# Patient Record
Sex: Female | Born: 1965
Health system: Southern US, Community
[De-identification: ages and names within clinical notes are randomized; demographics above are authoritative.]

## PROBLEM LIST (undated history)

## (undated) DIAGNOSIS — R7301 Impaired fasting glucose: Secondary | ICD-10-CM

## (undated) DIAGNOSIS — J45909 Unspecified asthma, uncomplicated: Secondary | ICD-10-CM

## (undated) DIAGNOSIS — M47812 Spondylosis without myelopathy or radiculopathy, cervical region: Secondary | ICD-10-CM

## (undated) DIAGNOSIS — E119 Type 2 diabetes mellitus without complications: Secondary | ICD-10-CM

## (undated) DIAGNOSIS — D497 Neoplasm of unspecified behavior of endocrine glands and other parts of nervous system: Secondary | ICD-10-CM

## (undated) DIAGNOSIS — R011 Cardiac murmur, unspecified: Secondary | ICD-10-CM

## (undated) DIAGNOSIS — F329 Major depressive disorder, single episode, unspecified: Secondary | ICD-10-CM

## (undated) DIAGNOSIS — F411 Generalized anxiety disorder: Secondary | ICD-10-CM

## (undated) DIAGNOSIS — R7303 Prediabetes: Secondary | ICD-10-CM

## (undated) DIAGNOSIS — M1990 Unspecified osteoarthritis, unspecified site: Secondary | ICD-10-CM

## (undated) DIAGNOSIS — M722 Plantar fascial fibromatosis: Secondary | ICD-10-CM

## (undated) DIAGNOSIS — F410 Panic disorder [episodic paroxysmal anxiety] without agoraphobia: Secondary | ICD-10-CM

## (undated) DIAGNOSIS — J309 Allergic rhinitis, unspecified: Secondary | ICD-10-CM

## (undated) DIAGNOSIS — R338 Other retention of urine: Secondary | ICD-10-CM

## (undated) DIAGNOSIS — H6093 Unspecified otitis externa, bilateral: Secondary | ICD-10-CM

## (undated) DIAGNOSIS — B029 Zoster without complications: Secondary | ICD-10-CM

## (undated) DIAGNOSIS — D250 Submucous leiomyoma of uterus: Secondary | ICD-10-CM

## (undated) DIAGNOSIS — N920 Excessive and frequent menstruation with regular cycle: Secondary | ICD-10-CM

## (undated) DIAGNOSIS — I10 Essential (primary) hypertension: Secondary | ICD-10-CM

## (undated) HISTORY — DX: Generalized anxiety disorder: F41.1

## (undated) HISTORY — DX: Cardiac murmur, unspecified: R01.1

## (undated) HISTORY — DX: Impaired fasting glucose: R73.01

## (undated) HISTORY — DX: Plantar fascial fibromatosis: M72.2

## (undated) HISTORY — PX: ABDOMINAL HYSTERECTOMY: SHX81

## (undated) HISTORY — DX: Essential (primary) hypertension: I10

## (undated) HISTORY — DX: Zoster without complications: B02.9

## (undated) HISTORY — DX: Prediabetes: R73.03

## (undated) HISTORY — DX: Excessive and frequent menstruation with regular cycle: N92.0

## (undated) HISTORY — DX: Type 2 diabetes mellitus without complications: E11.9

## (undated) HISTORY — DX: Spondylosis without myelopathy or radiculopathy, cervical region: M47.812

## (undated) HISTORY — DX: Unspecified osteoarthritis, unspecified site: M19.90

## (undated) HISTORY — DX: Neoplasm of unspecified behavior of endocrine glands and other parts of nervous system: D49.7

## (undated) HISTORY — DX: Allergic rhinitis, unspecified: J30.9

## (undated) HISTORY — DX: Submucous leiomyoma of uterus: D25.0

## (undated) HISTORY — DX: Unspecified asthma, uncomplicated: J45.909

## (undated) HISTORY — DX: Other retention of urine: R33.8

## (undated) HISTORY — DX: Major depressive disorder, single episode, unspecified: F32.9

## (undated) HISTORY — DX: Panic disorder (episodic paroxysmal anxiety): F41.0

---
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
## 2003-02-28 ENCOUNTER — Other Ambulatory Visit: Admission: RE | Admit: 2003-02-28 | Discharge: 2003-02-28 | Payer: Self-pay | Admitting: Gynecology

## 2003-05-19 ENCOUNTER — Ambulatory Visit (HOSPITAL_COMMUNITY): Admission: RE | Admit: 2003-05-19 | Discharge: 2003-05-19 | Payer: Self-pay | Admitting: Gynecology

## 2003-05-19 ENCOUNTER — Encounter: Payer: Self-pay | Admitting: Gynecology

## 2003-09-02 ENCOUNTER — Encounter: Payer: Self-pay | Admitting: Obstetrics and Gynecology

## 2003-09-02 ENCOUNTER — Ambulatory Visit (HOSPITAL_COMMUNITY): Admission: RE | Admit: 2003-09-02 | Discharge: 2003-09-02 | Payer: Self-pay | Admitting: Obstetrics and Gynecology

## 2003-11-29 ENCOUNTER — Encounter: Admission: RE | Admit: 2003-11-29 | Discharge: 2003-11-29 | Payer: Self-pay | Admitting: Obstetrics and Gynecology

## 2004-02-10 ENCOUNTER — Inpatient Hospital Stay (HOSPITAL_COMMUNITY): Admission: AD | Admit: 2004-02-10 | Discharge: 2004-02-13 | Payer: Self-pay | Admitting: Obstetrics and Gynecology

## 2004-03-19 ENCOUNTER — Other Ambulatory Visit: Admission: RE | Admit: 2004-03-19 | Discharge: 2004-03-19 | Payer: Self-pay | Admitting: Obstetrics and Gynecology

## 2004-03-21 ENCOUNTER — Encounter: Admission: RE | Admit: 2004-03-21 | Discharge: 2004-03-21 | Payer: Self-pay | Admitting: Obstetrics and Gynecology

## 2004-04-12 ENCOUNTER — Encounter: Admission: RE | Admit: 2004-04-12 | Discharge: 2004-04-12 | Payer: Self-pay | Admitting: Obstetrics and Gynecology

## 2004-08-10 ENCOUNTER — Encounter: Admission: RE | Admit: 2004-08-10 | Discharge: 2004-08-10 | Payer: Self-pay | Admitting: Obstetrics and Gynecology

## 2005-03-03 ENCOUNTER — Other Ambulatory Visit: Admission: RE | Admit: 2005-03-03 | Discharge: 2005-03-03 | Payer: Self-pay | Admitting: Gynecology

## 2006-01-30 ENCOUNTER — Other Ambulatory Visit: Admission: RE | Admit: 2006-01-30 | Discharge: 2006-01-30 | Payer: Self-pay | Admitting: Gynecology

## 2007-07-31 ENCOUNTER — Ambulatory Visit (HOSPITAL_COMMUNITY): Admission: RE | Admit: 2007-07-31 | Discharge: 2007-07-31 | Payer: Self-pay | Admitting: Family Medicine

## 2008-03-10 ENCOUNTER — Other Ambulatory Visit: Admission: RE | Admit: 2008-03-10 | Discharge: 2008-03-10 | Payer: Self-pay | Admitting: Gynecology

## 2009-12-09 DIAGNOSIS — M47812 Spondylosis without myelopathy or radiculopathy, cervical region: Secondary | ICD-10-CM

## 2009-12-09 HISTORY — DX: Spondylosis without myelopathy or radiculopathy, cervical region: M47.812

## 2010-01-18 ENCOUNTER — Ambulatory Visit (HOSPITAL_COMMUNITY): Admission: RE | Admit: 2010-01-18 | Discharge: 2010-01-18 | Payer: Self-pay | Admitting: Neurosurgery

## 2010-02-22 ENCOUNTER — Encounter: Admission: RE | Admit: 2010-02-22 | Discharge: 2010-03-20 | Payer: Self-pay | Admitting: Neurosurgery

## 2010-03-31 ENCOUNTER — Ambulatory Visit: Payer: Self-pay | Admitting: Family Medicine

## 2010-03-31 DIAGNOSIS — H6500 Acute serous otitis media, unspecified ear: Secondary | ICD-10-CM | POA: Insufficient documentation

## 2010-03-31 DIAGNOSIS — J309 Allergic rhinitis, unspecified: Secondary | ICD-10-CM

## 2010-03-31 DIAGNOSIS — J019 Acute sinusitis, unspecified: Secondary | ICD-10-CM | POA: Insufficient documentation

## 2010-03-31 HISTORY — DX: Allergic rhinitis, unspecified: J30.9

## 2010-05-09 ENCOUNTER — Encounter
Admission: RE | Admit: 2010-05-09 | Discharge: 2010-08-07 | Payer: Self-pay | Admitting: Physical Medicine & Rehabilitation

## 2010-05-11 ENCOUNTER — Ambulatory Visit: Payer: Self-pay | Admitting: Physical Medicine & Rehabilitation

## 2010-06-28 ENCOUNTER — Ambulatory Visit: Payer: Self-pay | Admitting: Physical Medicine & Rehabilitation

## 2010-07-24 ENCOUNTER — Other Ambulatory Visit: Admission: RE | Admit: 2010-07-24 | Discharge: 2010-07-24 | Payer: Self-pay | Admitting: Obstetrics and Gynecology

## 2010-08-24 ENCOUNTER — Encounter
Admission: RE | Admit: 2010-08-24 | Discharge: 2010-11-22 | Payer: Self-pay | Source: Home / Self Care | Attending: Physical Medicine & Rehabilitation | Admitting: Physical Medicine & Rehabilitation

## 2010-08-30 ENCOUNTER — Ambulatory Visit: Payer: Self-pay | Admitting: Physical Medicine & Rehabilitation

## 2010-09-25 ENCOUNTER — Ambulatory Visit: Payer: Self-pay | Admitting: Physical Medicine & Rehabilitation

## 2010-10-11 ENCOUNTER — Ambulatory Visit: Payer: Self-pay | Admitting: Physical Medicine & Rehabilitation

## 2010-11-08 ENCOUNTER — Ambulatory Visit: Payer: Self-pay | Admitting: Physical Medicine & Rehabilitation

## 2010-12-09 HISTORY — PX: ENDOMETRIAL BIOPSY: SHX622

## 2011-01-07 ENCOUNTER — Other Ambulatory Visit: Payer: Self-pay | Admitting: Obstetrics and Gynecology

## 2011-01-08 NOTE — Assessment & Plan Note (Signed)
Summary: Cough-dry, runny nose-green, ear pain - L x 3 wks rm proced rm   Vital Signs:  Patient Profile:   45 Years Old Female CC:      Cold & URI symptoms Height:     65.5 inches Weight:      142 pounds O2 Sat:      99 % O2 treatment:    Room Air Temp:     97.9 degrees F oral Pulse rate:   82 / minute Pulse rhythm:   regular Resp:     16 per minute BP sitting:   119 / 75  (right arm) Cuff size:   regular  Vitals Entered By: Areta Haber CMA (March 31, 2010 2:43 PM)                  Prior Medication List:  No prior medications documented  Current Allergies: No known allergies History of Present Illness Chief Complaint: Cold & URI symptoms History of Present Illness: Patient has had a URI w/ ear pain. She has had sinus drainage. Her symptoms has been going on for 2 weeks. She thought she was getting better when he became sick and she stayed up w/ him she had ar elapse. She has also had some nausea from the post nasal drainage and fever. Yesterday it was a 100.   Current Problems: ACUTE SEROUS OTITIS MEDIA (ICD-381.01) ACUTE SINUSITIS, UNSPECIFIED (ICD-461.9) FAMILY HISTORY DIABETES 1ST DEGREE RELATIVE (ICD-V18.0) FAMILY HISTORY OF ASTHMA (ICD-V17.5) ALLERGIC RHINITIS (ICD-477.9)   Current Meds ALLEGRA 180 MG TABS (FEXOFENADINE HCL) 1 tab by mouth once daily FLONASE 50 MCG/ACT SUSP (FLUTICASONE PROPIONATE) as directed ETODOLAC 400 MG TABS (ETODOLAC) 2 tabs by mouth once daily FLEXERIL 10 MG TABS (CYCLOBENZAPRINE HCL) as directed AUGMENTIN 875-125 MG TABS (AMOXICILLIN-POT CLAVULANATE) 1 by mouth 2 times daily * FLONASE OR NASONEX NASAL SPRAY 2 pufs each nostril q day * CLARITIN-D 24HRS  /OR ALLEGRA-D 24HRS sig 1 by mouth q day  REVIEW OF SYSTEMS Constitutional Symptoms      Denies fever, chills, night sweats, weight loss, weight gain, and fatigue.  Eyes       Denies change in vision, eye pain, eye discharge, glasses, contact lenses, and eye  surgery. Ear/Nose/Throat/Mouth       Complains of ear pain, frequent runny nose, sinus problems, and hoarseness.      Denies hearing loss/aids, change in hearing, ear discharge, dizziness, frequent nose bleeds, sore throat, and tooth pain or bleeding.      Comments: L, green x 3 wks Respiratory       Complains of dry cough.      Denies productive cough, wheezing, shortness of breath, asthma, bronchitis, and emphysema/COPD.  Cardiovascular       Denies murmurs, chest pain, and tires easily with exhertion.    Gastrointestinal       Denies stomach pain, nausea/vomiting, diarrhea, constipation, blood in bowel movements, and indigestion. Genitourniary       Denies painful urination, kidney stones, and loss of urinary control. Neurological       Complains of headaches.      Denies paralysis, seizures, and fainting/blackouts. Musculoskeletal       Denies muscle pain, joint pain, joint stiffness, decreased range of motion, redness, swelling, muscle weakness, and gout.  Skin       Denies bruising, unusual mles/lumps or sores, and hair/skin or nail changes.  Psych       Denies mood changes, temper/anger issues, anxiety/stress, speech problems, depression, and sleep  problems. Other Comments: Pt has not seen PCP for this.   Past History:  Family History: Last updated: 03/31/2010 Family History of Asthma Family History Diabetes 1st degree relative Family History of Cardiovascular disorder  Social History: Last updated: 03/31/2010 Married Alcohol use-no Drug use-no Regular exercise-yes Never Smoked  Risk Factors: Exercise: yes (03/31/2010)  Risk Factors: Smoking Status: never (03/31/2010)  Past Medical History: Allergic rhinitis  Past Surgical History: Appendectomy Caesarean section  Family History: Reviewed history and no changes required. Family History of Asthma Family History Diabetes 1st degree relative Family History of Cardiovascular disorder  Social  History: Reviewed history and no changes required. Married Alcohol use-no Drug use-no Regular exercise-yes Never Smoked Smoking Status:  never Drug Use:  no Does Patient Exercise:  yes Physical Exam General appearance: well developed, well nourished, milddistress Head: normocephalic, atraumatic Ears: fluid behind theL ear Nasal: swollen red turbinates with congestion Oral/Pharynx: pharyngeal erythema without exudate, uvula midline without deviation Neck: supple,anterior lymphadenopathy present Skin: no obvious rashes or lesions MSE: oriented to time, place, and person tenderness over the maxillary sinuses Assessment New Problems: ACUTE SEROUS OTITIS MEDIA (ICD-381.01) ACUTE SINUSITIS, UNSPECIFIED (ICD-461.9) FAMILY HISTORY DIABETES 1ST DEGREE RELATIVE (ICD-V18.0) FAMILY HISTORY OF ASTHMA (ICD-V17.5) ALLERGIC RHINITIS (ICD-477.9)  sinusitis  serous otitis  Patient Education: Patient and/or caregiver instructed in the following: rest fluids and Tylenol.  Plan New Medications/Changes: CLARITIN-D 24HRS  /OR ALLEGRA-D 24HRS sig 1 by mouth q day  #30 x 0, 03/31/2010, Hassan Rowan MD FLONASE OR NASONEX NASAL SPRAY 2 pufs each nostril q day  #1 x 0, 03/31/2010, Hassan Rowan MD AUGMENTIN [phone] MG TABS (AMOXICILLIN-POT CLAVULANATE) 1 by mouth 2 times daily  #20 x 0, 03/31/2010, Hassan Rowan MD  New Orders: New Patient Level III [phone] Follow Up: Follow up in 2-3 days if no improvement, Follow up on an as needed basis, Follow up with Primary Physician  The patient and/or caregiver has been counseled thoroughly with regard to medications prescribed including dosage, schedule, interactions, rationale for use, and possible side effects and they verbalize understanding.  Diagnoses and expected course of recovery discussed and will return if not improved as expected or if the condition worsens. Patient and/or caregiver verbalized understanding.  Prescriptions: CLARITIN-D 24HRS  /OR  ALLEGRA-D 24HRS sig 1 by mouth q day  #30 x 0   Entered and Authorized by:   Hassan Rowan MD   Signed by:   Hassan Rowan MD on 03/31/2010   Method used:   Print then Give to Patient   RxID:   [id number] FLONASE OR NASONEX NASAL SPRAY 2 pufs each nostril q day  #1 x 0   Entered and Authorized by:   Hassan Rowan MD   Signed by:   Hassan Rowan MD on 03/31/2010   Method used:   Print then Give to Patient   RxID:   [id number] AUGMENTIN 875-125 MG TABS (AMOXICILLIN-POT CLAVULANATE) 1 by mouth 2 times daily  #20 x 0   Entered and Authorized by:   Hassan Rowan MD   Signed by:   Hassan Rowan MD on 03/31/2010   Method used:   Print then Give to Patient   RxID:   [id number]   Patient Instructions: 1)  Please schedule an appointment with your primary doctor in : 2)  Please schedule a follow-up appointment in 2 weeks. 3)  Take your antibiotic as prescribed until ALL of it is gone, but stop if you develop a rash or swelling and contact our office as  soon as possible. 4)  Acute sinusitis symptoms for less than 10 days are not helped by antibiotics.Use warm moist compresses, and over the counter decongestants ( only as directed). Call if no improvement in 5-7 days, sooner if increasing pain, fever, or new symptoms. 5)  Please schedule a follow-up appointment as needed.  Preventive Screening-Counseling & Management  Alcohol-Tobacco     Smoking Status: never  Caffeine-Diet-Exercise     Does Patient Exercise: yes      Drug Use:  no.

## 2011-02-12 ENCOUNTER — Ambulatory Visit: Payer: Self-pay | Admitting: Physical Medicine & Rehabilitation

## 2011-02-12 ENCOUNTER — Encounter: Payer: 59 | Attending: Physical Medicine & Rehabilitation

## 2011-02-12 DIAGNOSIS — M47812 Spondylosis without myelopathy or radiculopathy, cervical region: Secondary | ICD-10-CM | POA: Insufficient documentation

## 2011-02-12 DIAGNOSIS — M5030 Other cervical disc degeneration, unspecified cervical region: Secondary | ICD-10-CM | POA: Insufficient documentation

## 2011-02-12 DIAGNOSIS — M549 Dorsalgia, unspecified: Secondary | ICD-10-CM | POA: Insufficient documentation

## 2011-02-12 DIAGNOSIS — M542 Cervicalgia: Secondary | ICD-10-CM | POA: Insufficient documentation

## 2011-02-14 HISTORY — PX: ENDOMETRIAL ABLATION: SHX621

## 2011-02-23 ENCOUNTER — Encounter: Payer: Self-pay | Admitting: Family Medicine

## 2011-02-23 ENCOUNTER — Inpatient Hospital Stay (INDEPENDENT_AMBULATORY_CARE_PROVIDER_SITE_OTHER)
Admission: RE | Admit: 2011-02-23 | Discharge: 2011-02-23 | Disposition: A | Discharge status: Home / Self Care | Payer: 59 | Source: Ambulatory Visit | Attending: Family Medicine | Admitting: Family Medicine

## 2011-02-23 ENCOUNTER — Ambulatory Visit
Admission: RE | Admit: 2011-02-23 | Discharge: 2011-02-23 | Disposition: A | Discharge status: Home / Self Care | Payer: 59 | Source: Ambulatory Visit | Attending: Family Medicine | Admitting: Family Medicine

## 2011-02-23 ENCOUNTER — Other Ambulatory Visit: Payer: Self-pay | Admitting: Family Medicine

## 2011-02-23 DIAGNOSIS — S59909A Unspecified injury of unspecified elbow, initial encounter: Secondary | ICD-10-CM

## 2011-02-23 DIAGNOSIS — S59919A Unspecified injury of unspecified forearm, initial encounter: Secondary | ICD-10-CM

## 2011-02-23 DIAGNOSIS — S6990XA Unspecified injury of unspecified wrist, hand and finger(s), initial encounter: Secondary | ICD-10-CM

## 2011-02-23 DIAGNOSIS — S63509A Unspecified sprain of unspecified wrist, initial encounter: Secondary | ICD-10-CM

## 2011-02-23 DIAGNOSIS — M25539 Pain in unspecified wrist: Secondary | ICD-10-CM

## 2011-02-25 ENCOUNTER — Telehealth (INDEPENDENT_AMBULATORY_CARE_PROVIDER_SITE_OTHER): Payer: Self-pay | Admitting: *Deleted

## 2011-02-26 NOTE — Assessment & Plan Note (Signed)
Summary: wrist injury/TM rm 4   Vital Signs:  Patient Profile:   45 Years Old Female CC:      LT wrist pain Height:     65.5 inches Weight:      141.50 pounds O2 Sat:      100 % O2 treatment:    Room Air Temp:     98.6 degrees F oral Pulse rate:   64 / minute Resp:     16 per minute BP sitting:   119 / 68  (left arm) Cuff size:   regular  Vitals Entered By: Clemens Catholic LPN (February 23, 2011 12:37 PM)                  Updated Prior Medication List: No Medications Current Allergies (reviewed today): No known allergies History of Present Illness Chief Complaint: LT wrist pain History of Present Illness:  Subjective:  Patient complains of falling while skating last night, injuring her left wrist (hyper-extended)  REVIEW OF SYSTEMS Constitutional Symptoms      Denies fever, chills, night sweats, weight loss, weight gain, and fatigue.  Eyes       Denies change in vision, eye pain, eye discharge, glasses, contact lenses, and eye surgery. Ear/Nose/Throat/Mouth       Denies hearing loss/aids, change in hearing, ear pain, ear discharge, dizziness, frequent runny nose, frequent nose bleeds, sinus problems, sore throat, hoarseness, and tooth pain or bleeding.  Respiratory       Denies dry cough, productive cough, wheezing, shortness of breath, asthma, bronchitis, and emphysema/COPD.  Cardiovascular       Denies murmurs, chest pain, and tires easily with exhertion.    Gastrointestinal       Denies stomach pain, nausea/vomiting, diarrhea, constipation, blood in bowel movements, and indigestion. Genitourniary       Denies painful urination, kidney stones, and loss of urinary control. Neurological       Denies paralysis, seizures, and fainting/blackouts. Musculoskeletal       Denies muscle pain, joint pain, joint stiffness, decreased range of motion, redness, swelling, muscle weakness, and gout.  Skin       Denies bruising, unusual mles/lumps or sores, and hair/skin or nail  changes.  Psych       Denies mood changes, temper/anger issues, anxiety/stress, speech problems, depression, and sleep problems. Other Comments: pt states that she was roller skating last night and fell and injury her LT wrist. she states that she has a knot on it. she is able to move it. no OTC meds.   Past History:  Past Medical History: Reviewed history from 03/31/2010 and no changes required. Allergic rhinitis  Past Surgical History: Reviewed history from 03/31/2010 and no changes required. Appendectomy Caesarean section  Family History: Reviewed history from 03/31/2010 and no changes required. Family History of Asthma Family History Diabetes 1st degree relative Family History of Cardiovascular disorder  Social History: Reviewed history from 03/31/2010 and no changes required. Married Alcohol use-no Drug use-no Regular exercise-yes Never Smoked   Objective:  No acute distress  Left wrist:  No swelling or deformity.  Full range of motion.  Mild tenderness and ecchymosis volar area over distal radius.  No snuffbox tenderness.  Distal neurovascular intact  X-ray left wrist:  IMPRESSION: No acute bony injury. Assessment New Problems: WRIST SPRAIN, LEFT (ICD-842.00) WRIST INJURY, LEFT (ICD-959.3)   The patient and/or caregiver has been counseled thoroughly with regard to medications prescribed including dosage, schedule, interactions, rationale for use, and possible side effects and  they verbalize understanding.  Diagnoses and expected course of recovery discussed and will return if not improved as expected or if the condition worsens. Patient and/or caregiver verbalized understanding.   Orders Added: 1)  T-DG Wrist Complete*L* [73110] 2)  Splints- All Types [A4570] 3)  Est. Patient Level III [57846]  Appended Document: wrist injury/TM rm 4 Plan:  Splint applied; wear about 5 days.  Apply ice pack several times daily.  May take Aleve, 2 tabs every 12 hours.  Begin  range of motion exercises in about 5 days ([facility] information and instruction patient handout given).  Follow-up with sports med clinic if not improved two weeks.

## 2011-02-28 ENCOUNTER — Ambulatory Visit (HOSPITAL_BASED_OUTPATIENT_CLINIC_OR_DEPARTMENT_OTHER): Payer: 59 | Admitting: Physical Medicine & Rehabilitation

## 2011-02-28 DIAGNOSIS — M542 Cervicalgia: Secondary | ICD-10-CM

## 2011-02-28 DIAGNOSIS — M47812 Spondylosis without myelopathy or radiculopathy, cervical region: Secondary | ICD-10-CM

## 2011-03-07 NOTE — Progress Notes (Signed)
  Phone Note Outgoing Call Call back at [facility] Phone [phone]   Call placed by: Lajean Saver RN,  February 25, 2011 1:31 PM Summary of Call: Callback: Patient reports her wrist is a little better. She continues to wear her brace.

## 2011-03-13 ENCOUNTER — Ambulatory Visit: Payer: 59 | Attending: Physical Medicine & Rehabilitation | Admitting: Physical Therapy

## 2011-03-13 DIAGNOSIS — M542 Cervicalgia: Secondary | ICD-10-CM | POA: Insufficient documentation

## 2011-03-13 DIAGNOSIS — R293 Abnormal posture: Secondary | ICD-10-CM | POA: Insufficient documentation

## 2011-03-13 DIAGNOSIS — IMO0001 Reserved for inherently not codable concepts without codable children: Secondary | ICD-10-CM | POA: Insufficient documentation

## 2011-04-02 ENCOUNTER — Ambulatory Visit: Payer: 59 | Admitting: Physical Medicine & Rehabilitation

## 2011-04-25 ENCOUNTER — Ambulatory Visit (HOSPITAL_BASED_OUTPATIENT_CLINIC_OR_DEPARTMENT_OTHER): Payer: 59 | Admitting: Physical Medicine & Rehabilitation

## 2011-04-25 ENCOUNTER — Encounter: Payer: 59 | Attending: Physical Medicine & Rehabilitation

## 2011-04-25 DIAGNOSIS — M47812 Spondylosis without myelopathy or radiculopathy, cervical region: Secondary | ICD-10-CM | POA: Insufficient documentation

## 2011-04-25 DIAGNOSIS — M5030 Other cervical disc degeneration, unspecified cervical region: Secondary | ICD-10-CM

## 2011-04-25 DIAGNOSIS — M549 Dorsalgia, unspecified: Secondary | ICD-10-CM | POA: Insufficient documentation

## 2011-04-25 DIAGNOSIS — M542 Cervicalgia: Secondary | ICD-10-CM | POA: Insufficient documentation

## 2011-04-25 NOTE — Assessment & Plan Note (Signed)
REASON FOR VISIT:  Left greater than right-sided neck pain.  History of chronic neck pain, minor disk bulges at C4-5, C5-6 on MRI. No significant facet hypertrophy.  Trigger point injections have not been helpful.  Physical therapy has not been helped.  The patient has done some range of motion exercises which is partially helpful.  Her medial branch blocks left C4, 5, 6 gave her some relief, given October 11, 2010, repeated again.  She was referred to Dr. Nickola Major for radiofrequency, but did not insurance approval.  Then, she underwent what sounds like a cervical epidural and also had some improvement after that.  She continues to work 40 hours a week at [employer].  No pain radiating into the arms.  She has had no new medical problems, since I last saw her, but it started with some headache related to her neck pain.  Pain level is 8/10.  PHYSICAL EXAMINATION:  VITAL SIGNS:  Blood pressure 130/69, pulse 80, respirations 18, O2 sat 100% on room air. GENERAL:  No acute distress. MUSCULOSKELETAL:  Neck range of motion is full.  No directional component to pain.  Spurling's test is negative.  Upper extremity strength, range of motion, and deep tendon reflexes are normal.  Her left upper trap has tenderness to palpation.  IMPRESSION: 1. Cervical spondylosis, degenerative disk, and chronic neck pain.  No     radicular component. 2. Prior history of fibromyalgia, this may be playing into some of her     pain complaints.  PLAN: 1. We will start her on tramadol 50 t.i.d. 2. Toradol 30 mg IM today. 3. Refer her back to Dr. Nickola Major for repeat epidural.  Discussed with patient agrees with plan.     Erick Colace, M.D. Electronically Signed    AEK/MedQ D:  04/25/2011 12:46:18  T:  04/25/2011 23:50:42  Job #:  [id number]  cc:   Herminio Heads, MD Fax: [phone]

## 2011-04-26 ENCOUNTER — Ambulatory Visit: Payer: 59 | Admitting: Physical Medicine & Rehabilitation

## 2011-04-26 NOTE — Discharge Summary (Signed)
[name], Sara Burton                    ACCOUNT NO.:  [id number]   MEDICAL RECORD NO.:  [id number]                   PATIENT TYPE:  INP   LOCATION:  9105                                 FACILITY:  [facility]   PHYSICIAN:  Marie L. Williams, C.N.M.           DATE OF BIRTH:  May 11, 1966   DATE OF ADMISSION:  02/10/2004  DATE OF DISCHARGE:  02/13/2004                                 DISCHARGE SUMMARY   ADMISSION DIAGNOSES:  1. Intrauterine pregnancy at 41-3/7 weeks.  2. Active labor.  3. Group B strep positive.  4. Desires epidural.  5. Gestational diabetes.   DISCHARGE DIAGNOSES:  1. Intrauterine pregnancy at 41-3/7 weeks.  2. Active labor.  3. Group B strep positive.  4. Desires epidural.  5. Gestational diabetes.  6. Failed vacuum and arrest in transverse.   HOSPITAL PROCEDURES:  1. Epidural anesthesia.  2. Primary low transverse cesarean section of a viable female infant named     [name], Apgars 8 and 9, weight 8 pounds 14 ounces.   HOSPITAL COURSE:  The patient was admitted in active labor at 6 cm dilation.  She received an epidural for pain relief.  Amniotomy was performed at 8 cm  dilation.  She progressed to complete dilation and began to push.  The  baby's vertex failed to descend past +2 station, and a vacuum extraction was  attempted by Dr. Estanislado Pandy with traction over 3 contractions with pop offs.  Decision was made at that point to proceed with cesarean section due to  persistence of LOT and failure to descend.  Primary low transverse cesarean  section was performed under epidural anesthesia with no complications.  The  patient's recovery went well on postop day #1.  Vital signs were stable.  Hemoglobin was 10.6.  Fasting blood sugar was 70.  Two hour repeat blood  sugars were 102-126.  On postop day #2, fasting blood sugars were 73 and two  hour repeat blood sugars were 102 and 126.  On postop day #3, the patient  continued to be stable.  Her vital signs were within  normal limits.  Chest  was clear.  Heart rate regular rate and rhythm.  Incision was clean, dry and  intact.  Lochia was small.  Extremities within normal limits.  She was  deemed to receive the full benefit for hospital stay and was discharged  home.   DISCHARGE MEDICATIONS:  1. Tylox 1-2 p.o. q.4h. p.r.n.  2. Motrin 600 mg p.o. q.6h. p.r.n.   DISCHARGE LABORATORIES:  White blood cell count 16.8, hemoglobin 10.6,  hematocrit 30.9, platelets 161,000.   DISCHARGE INSTRUCTIONS:  1. Per __________ handout.  2. Discharge follow up in 6 weeks or p.r.n.  Marie L. Mayford Knife, C.N.M.    MLW/MEDQ  D:  02/13/2004  T:  02/13/2004  Job:  [id number]

## 2011-04-26 NOTE — H&P (Signed)
Sara Burton, [name]                    ACCOUNT NO.:  [id number]   MEDICAL RECORD NO.:  [id number]                   PATIENT TYPE:  INP   LOCATION:  9167                                 FACILITY:  [facility]   PHYSICIAN:  Osborn Coho, M.D.                DATE OF BIRTH:  01-31-1966   DATE OF ADMISSION:  02/10/2004  DATE OF DISCHARGE:                                HISTORY & PHYSICAL   Sara Burton is a 45 year old married white female, gravida 1, para 0,  at 41-3/7 weeks, who presents complaining of uterine contractions every four  to six minutes since about 2 a.m. this morning.  She denies any leaking or  vaginal bleeding but does report having seen a streak of red discharge.  She  denies any headaches, nausea, vomiting, or visual disturbances.  Her  pregnancy has been followed at [facility] by the M.D. service  and has been essentially uncomplicated by at risk for:   1. Gestational diabetes, in good control.  2. Advanced maternal age and declining amniocentesis.  3. History of fibroids.  4. History of infertility.  5. Positive group B strep.  6. She is also requesting an epidural for labor.    OBSTETRICAL AND GYNECOLOGIC HISTORY:  She is a gravida 1, para 0, with an  LMP of Apr 11, 2003.  History of fibroids diagnosed on ultrasound in June  2004.  History of HSG and Clomid challenge.   GENERAL MEDICAL HISTORY:  She has an allergy to [facility] FORMULA 44.   She reports having had the usual childhood diseases.  She has a history of a  mild heart murmur but does not take antibiotics prior to procedures and  occasional urinary tract infections.  She did have an accident where she  broke her collar bone in two places and broke a finger.   SURGICAL HISTORY:  Wisdom teeth and repair of her finger and an  appendectomy.   FAMILY HISTORY:  Significant for father with quadruple bypass surgery,  brother with asthma.  Paternal side of the family with insulin-dependent  diabetes and a sister with questionable thyroid disease, and paternal  grandmother has a history of only one kidney secondary to cancer, and  paternal grandmother also has breast cancer.   GENETIC HISTORY:  Essentially negative, though she is over age 21 and  declines an amniocentesis with this pregnancy.   SOCIAL HISTORY:  She is married to [employer], who is involved and  supportive.  He is employed full-time in [employer], and she is employed full  time as a Nurse, mental health.  She is of the [employer].  She denies any  illicit drug use, alcohol, or smoking with this pregnancy.   PRENATAL LABORATORY DATA:  Her blood type is A positive.  Her antibody  screen is negative.  Syphilis is negative.  Rubella is immune. Hepatitis B  surface antigen is negative.  Pap was within normal limits,  and her 36-week  beta strep was positive and her one-hour Glucola was elevated.  Her three-  hour GTT was abnormal, and she has been on diet control management of  gestational diabetes since then and has all been in good control without  needing insulin.  Her last ultrasound was estimated 8 pounds on February 22  and showed 62nd percentile, normal fluid.   PHYSICAL EXAMINATION:  VITAL SIGNS:  Stable, she is afebrile.  HEENT:  Grossly within normal limits.  CARDIAC:  Regular rhythm and rate.  CHEST:  Clear.  BREASTS:  Soft and nontender.  ABDOMEN:  Gravid with uterine contractions every four to six minutes.  Fetal  heart rate is reactive and reassuring.  PELVIC:  6 cm, 95% effaced, vertex, -1, and bulging membranes.  EXTREMITIES:  Within normal limits.   ASSESSMENT:  1. Intrauterine pregnancy at term.  2. Active labor.  3. Positive group B strep.  4. Gestational diabetes.  5. Desires epidural for labor.   Her plan per consult with Dr. Su Hilt is to admit to labor and delivery, be  placed on epidural for labor, and penicillin for group B strep prophylaxis.     Concha Pyo. Duplantis, C.N.M.               Osborn Coho, M.D.    SJD/MEDQ  D:  02/10/2004  T:  02/10/2004  Job:  [id number]

## 2011-04-26 NOTE — Op Note (Signed)
Sara Burton, Sara Burton                    ACCOUNT NO.:  [id number]   MEDICAL RECORD NO.:  [id number]                   PATIENT TYPE:  INP   LOCATION:  9105                                 FACILITY:  [facility]   PHYSICIAN:  Crist Fat. Rivard, M.D.              DATE OF BIRTH:  [birth year]   DATE OF PROCEDURE:  02/10/2004  DATE OF DISCHARGE:                                 OPERATIVE REPORT   PREOPERATIVE DIAGNOSES:  1. Intrauterine pregnancy at 41 weeks 3 days.  2. Failed vacuum delivery with arrest in transverse presentation,     transverse position.   POSTOPERATIVE DIAGNOSES:  1. Intrauterine pregnancy at 41 weeks 3 days.  2. Failed vacuum delivery with arrest in transverse presentation,     transverse position.   ANESTHESIA:  Epidural.   PROCEDURE:  Primary low transverse cesarean section.   SURGEON:  Crist Fat. Rivard, M.D.   ASSISTANT:  Elby Showers. Williams, C.N.M.   ESTIMATED BLOOD LOSS:  800 mL.   DESCRIPTION OF PROCEDURE:  After being informed of the planned procedure  with possible complications including bleeding, infection, injury to bowels,  bladder, and ureters, informed consent was obtained.  The patient is taken  to OR #1, given reinforcement of her previously-placed epidural.  She is  placed in a dorsal decubitus position, pelvis tilted to the left, prepped  and draped in a sterile fashion, and a Foley catheter is inserted in her  bladder.   After assessing adequate level of anesthesia, we infiltrated the suprapubic  area with 15 mL of Marcaine 0.25% and perform a Pfannenstiel incision, which  is brought down to the fascia.  The fascia is incised in the low transverse  fashion, linea alba is dissected, peritoneum is entered in a midline  fashion.  Visceral peritoneum is entered in the low transverse fashion,  allowing [place] to safely retract bladder by developing a bladder flap.  Uterus  is then entered in the low transverse fashion, first with knife, then  bluntly.   Amniotic fluid is clear.  We assist the birth of a female infant in  vertex presentation, LOT, LOT as transverse, at 2:56 p.m.  Mouth and nose  are suctioned, cord is clamped with two Kelly clamps and sectioned, and the  baby is given to the pediatrician present in the room.  Twenty milliliters  of blood is drawn from the umbilical vein, and the placenta is allowed to  deliver spontaneously.  It is complete, and cord has three vessels.  Uterine  revision is negative.  We proceed with closure of myometrium in two layers,  first with a running locked suture of 0 Vicryl, then with a Lembert suture  of 0 Vicryl imbricating the first line of suture.  Hemostasis is assessed  and adequate.  Both paracolic gutters are cleansed.  Both tubes and ovaries  are assessed and normal.  To be noted, a small subserosal fibroid on the  left cornu  measuring 2 cm as well as a submucosal fibroid on the anterior  wall measuring 3-4 cm.  Both paracolic gutters are cleansed and pelvis is  irrigated with warm saline.  Hemostasis is assessed and again felt to be  adequate.  Under fascia hemostasis is completed with cautery, and fascia is  closed with two running sutures of 0 Vicryl meeting midline.  The incision  is then irrigated with warm saline, hemostasis is completed with cautery,  and skin is closed with staples.   Instruments and sponges complete x2.  Estimated blood loss is 800 mL.  The  procedure is very well-tolerated by the patient, who is taken to the  recovery room in a well and stable condition.  A little boy named [name] was  born at 2:56 p.m., received an Apgar of 8 at one minute and 9 at five  minutes, and weighed 8 pounds 14 ounces.                                               Crist Fat Rivard, M.D.    SAR/MEDQ  D:  02/10/2004  T:  02/11/2004  Job:  [id number]

## 2011-06-21 ENCOUNTER — Ambulatory Visit: Payer: 59 | Admitting: Physical Medicine & Rehabilitation

## 2011-06-21 ENCOUNTER — Ambulatory Visit: Payer: 59 | Admitting: Neurosurgery

## 2011-09-09 ENCOUNTER — Encounter: Payer: 59 | Attending: Physical Medicine & Rehabilitation

## 2011-09-09 ENCOUNTER — Ambulatory Visit (HOSPITAL_BASED_OUTPATIENT_CLINIC_OR_DEPARTMENT_OTHER): Payer: 59 | Admitting: Physical Medicine & Rehabilitation

## 2011-09-09 DIAGNOSIS — M5030 Other cervical disc degeneration, unspecified cervical region: Secondary | ICD-10-CM

## 2011-09-09 DIAGNOSIS — M542 Cervicalgia: Secondary | ICD-10-CM

## 2011-09-09 DIAGNOSIS — M47812 Spondylosis without myelopathy or radiculopathy, cervical region: Secondary | ICD-10-CM | POA: Insufficient documentation

## 2011-09-09 DIAGNOSIS — R51 Headache: Secondary | ICD-10-CM | POA: Insufficient documentation

## 2011-09-09 NOTE — Assessment & Plan Note (Signed)
REASON FOR VISIT:  Neck pain.  Sara Burton is a 45 year old female with chronic neck pain.  She has undergone cervical medial branch blocks as well as epidural injections which seem to help initially, but really did not help long-term.  She has had MRI of the C-spine in 2008 and again in 2012, showing mild disk bulge C4-5, C5-6 which did not really progress over time compared to 2008.  She has had no upper extremity symptomatology.  She has gone through physical therapy, has not tried any traction however.  I did review her MRI of the cervical spine once again today and once again really not showing much in terms of significant pathology and I really do not think she would benefit from a surgical opinion.  Her medications include Tylenol which helps with the neck pain and also helps headaches.  She does not take this on a q.i.d. basis, but usually b.i.d.  She tried some tramadol, did not tolerate this from a GI standpoint.  In terms of her sleep, she does have some difficulty getting comfortable at night.  She has never tried a muscle relaxant.  PHYSICAL EXAMINATION:  VITAL SIGNS:  Her blood pressure is 115/69, pulse 62, respirations 14, O2 sat 100% on room air. GENERAL:  No acute distress.  Mood and affect appropriate. MUSCULOSKELETAL:  Her neck has full range of motion.  Upper extremity strength is normal.  Deep tendon reflexes are normal.  Upper extremity strength is normal in the upper extremities.  She has no tenderness to palpation to cervical spine.  IMPRESSION: 1. Cervical spondylosis with cervical degenerative disk, mild.  We     will trial her with some traction through PT and if successful,     home unit. 2. Continue TENS. 3. Continue heat 30 minutes q.2 hours p.r.n. 4. Trial Flexeril 10 mg at bedtime. 5. I will see her back in 1 month to reassess.  I do not think any     further imaging studies or interventional techniques are needed at     this time.     Erick Colace, M.D. Electronically Signed    AEK/MedQ D:  09/09/2011 14:47:51  T:  09/09/2011 23:01:15  Job #:  [id number]

## 2011-10-03 ENCOUNTER — Ambulatory Visit: Payer: 59 | Attending: Physical Medicine & Rehabilitation | Admitting: Rehabilitative and Restorative Service Providers"

## 2011-10-03 DIAGNOSIS — IMO0001 Reserved for inherently not codable concepts without codable children: Secondary | ICD-10-CM | POA: Insufficient documentation

## 2011-10-03 DIAGNOSIS — M542 Cervicalgia: Secondary | ICD-10-CM | POA: Insufficient documentation

## 2011-10-03 DIAGNOSIS — M2569 Stiffness of other specified joint, not elsewhere classified: Secondary | ICD-10-CM | POA: Insufficient documentation

## 2011-10-07 ENCOUNTER — Ambulatory Visit: Payer: 59 | Admitting: Rehabilitation

## 2011-10-07 ENCOUNTER — Ambulatory Visit: Payer: 59 | Admitting: Physical Medicine & Rehabilitation

## 2011-10-14 ENCOUNTER — Ambulatory Visit: Payer: 59 | Attending: Physical Medicine & Rehabilitation | Admitting: Rehabilitative and Restorative Service Providers"

## 2011-10-14 DIAGNOSIS — M2569 Stiffness of other specified joint, not elsewhere classified: Secondary | ICD-10-CM | POA: Insufficient documentation

## 2011-10-14 DIAGNOSIS — M542 Cervicalgia: Secondary | ICD-10-CM | POA: Insufficient documentation

## 2011-10-14 DIAGNOSIS — IMO0001 Reserved for inherently not codable concepts without codable children: Secondary | ICD-10-CM | POA: Insufficient documentation

## 2011-10-16 ENCOUNTER — Ambulatory Visit: Payer: 59 | Admitting: Rehabilitative and Restorative Service Providers"

## 2011-10-21 ENCOUNTER — Ambulatory Visit: Payer: 59

## 2011-10-25 ENCOUNTER — Ambulatory Visit: Payer: 59

## 2011-10-28 ENCOUNTER — Telehealth: Payer: Self-pay | Admitting: Family Medicine

## 2011-10-28 ENCOUNTER — Encounter: Payer: Self-pay | Admitting: Family Medicine

## 2011-10-28 ENCOUNTER — Ambulatory Visit (INDEPENDENT_AMBULATORY_CARE_PROVIDER_SITE_OTHER): Payer: 59 | Admitting: Family Medicine

## 2011-10-28 VITALS — BP 126/86 | HR 96 | Temp 98.0°F | Wt 151.0 lb

## 2011-10-28 DIAGNOSIS — J019 Acute sinusitis, unspecified: Secondary | ICD-10-CM | POA: Insufficient documentation

## 2011-10-28 MED ORDER — CICLESONIDE 50 MCG/ACT NA SUSP: 2.0000 | Freq: Every day | NASAL | Status: DC

## 2011-10-28 MED ORDER — HYDROCODONE-HOMATROPINE 5-1.5 MG/5ML PO SYRP: ORAL_SOLUTION | ORAL | Status: AC

## 2011-10-28 MED ORDER — AMOXICILLIN-POT CLAVULANATE 875-125 MG PO TABS: 1.0000 | ORAL_TABLET | Freq: Two times a day (BID) | ORAL | Status: AC

## 2011-10-28 NOTE — Patient Instructions (Signed)
Trial of mucinex DM or robitussin DM otc as directed on the box. May use OTC nasal saline spray or irrigation solution bid. OTC nonsedating antihistamines prn discussed.   

## 2011-10-28 NOTE — Telephone Encounter (Signed)
Pls request records from [facility] and from [facility] in [place].  Thx.

## 2011-10-28 NOTE — Telephone Encounter (Signed)
Done

## 2011-10-28 NOTE — Progress Notes (Signed)
Office Note 10/28/2011  CC:  Chief Complaint  Patient presents with  . Establish Care    congestion    HPI:  Sara Burton is a 45 y.o. White female who is here to establish care and discuss respiratory symptoms. Patient's most recent primary MD: [facility] (last visit was about 3 yrs ago). Old records in [facility] EMR were reviewed prior to or during today's visit.  Pt presents complaining of respiratory symptoms for 5  days.  Mostly nasal congestion/runny nose, sneezing, PND, scratchy throat.  Cough hacking/nagging/dry.  Worst symptoms seems to be the left upper teeth pain and intermittent jabbing left ear pain.  Lately the symptoms seem to be worsening. No fevers, no wheezing, and no SOB.  Symptoms worse in evening.  Symptoms improved by OTC cold meds--mildly. Smoker? no Recent sick contact? Yes, son. Muscle or joint aches? No. She has had her flu vaccine this season (> 2 wks ago).  ROS: no n/v/d or abdominal pain.  No rash.  No neck stiffness.   +Mild fatigue.  +Mild appetite loss.   Past Medical History  Diagnosis Date  . Cervical spondylosis 2011    w/out myelopathy; nerve block injections done 08/2010 and 10/2010 (Dr. Wynn Banker)  . Migraine     plus tension HA's; some occipital HA's associated with her cervicalgia  . Heart murmur     Pt reports this is present intermittently, says past ECHO normal.  She gets her GYN care through [facility]; last pap/pelvic was 03/2011. LMP was 10/20/11.  Past Surgical History  Procedure Date  . Cesarean section 02/2004  . Appendectomy 1979  . Finger surgery 1982    Ring finger right hand; no metal/screws in finger.    Family History  Problem Relation Age of Onset  . Heart disease Father     CABG in his 42s  . Diabetes Father   . Asthma Brother   . Cancer Paternal Grandmother     Breast; detected in her 47's at the earliest    History   Social History  . Marital Status: Married    Spouse Name: N/A    Number  of Children: N/A  . Years of Education: N/A   Occupational History  . Not on file.   Social History Main Topics  . Smoking status: Never Smoker   . Smokeless tobacco: Never Used  . Alcohol Use: No  . Drug Use: No  . Sexually Active: Not on file   Other Topics Concern  . 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   MEDS: Zyrtec 10mg  qd (generic/OTC). Outpatient Encounter Prescriptions as of 10/28/2011  Medication Sig Dispense Refill  . Aspirin-Acetaminophen-Caffeine (EXCEDRIN PO) Take by mouth as directed.        . Multiple Vitamin (MULTIVITAMIN) tablet Take 1 tablet by mouth daily.        . Pseudoeph-Doxylamine-DM-APAP (NYQUIL PO) Take by mouth as directed.        . Pseudoephedrine-APAP-DM (DAYQUIL PO) Take by mouth as directed.        Marland Kitchen amoxicillin-clavulanate (AUGMENTIN) 875-125 MG per tablet Take 1 tablet by mouth 2 (two) times daily.  20 tablet  0  . ciclesonide (OMNARIS) 50 MCG/ACT nasal spray Place 2 sprays into both nostrils daily.  12.5 g  0  . HYDROcodone-homatropine (HYCODAN) 5-1.5 MG/5ML syrup 1-2 tsp po q6h prn cough  120 mL  0    No Known Allergies  ROS Review of Systems  Constitutional: Positive for fatigue. Negative for fever.  HENT:       See hpi  Eyes: Negative for visual disturbance.  Respiratory: Negative for shortness of breath and wheezing.   Cardiovascular: Negative for chest pain and palpitations.  Gastrointestinal: Negative for nausea and abdominal pain.  Genitourinary: Negative for dysuria and urgency.  Musculoskeletal: Negative for back pain and joint swelling.  Skin: Negative for rash.  Neurological: Positive for headaches. Negative for dizziness, weakness and light-headedness.  Hematological: Negative for adenopathy.  Psychiatric/Behavioral: Negative for confusion and dysphoric mood.    PE; Blood pressure  126/86, pulse 96, temperature 98 F (36.7 C), temperature source Oral, weight 151 lb (68.493 kg), last menstrual period 10/20/2011, SpO2 98.00%. VS: noted--normal. Gen: alert, NAD, NONTOXIC APPEARING. HEENT: eyes without injection, drainage, or swelling.  Ears: EACs clear, TMs with normal light reflex and landmarks.  Nose: Clear rhinorrhea, with some dried, crusty exudate adherent to mildly injected mucosa.  No purulent d/c.  Diffuse paranasal sinus area TTP, more pronounced on left side. No facial swelling.  Throat and mouth without focal lesion.  No pharyngial swelling, erythema, or exudate.   Neck: supple, no LAD.   LUNGS: CTA bilat, nonlabored resps.   CV: RRR, no m/r/g. EXT: no c/c/e SKIN: no rash  Pertinent labs:  none  ASSESSMENT AND PLAN:   NEW PT: obtain old records.  Sinusitis acute Trial of augmentin 875mg  bid x 10d. Omnaris 2 sprays each nostril qd--sample given today. Trial of mucinex DM or robitussin DM otc as directed on the box. May use OTC nasal saline spray or irrigation solution bid. OTC nonsedating antihistamines prn discussed.  Decongestant use discussed--ok if tolerated in the past w/out side effect and if pt has no hx of HTN.      Return if symptoms worsen or fail to improve.

## 2011-10-28 NOTE — Assessment & Plan Note (Signed)
Trial of augmentin 875mg  bid x 10d. Omnaris 2 sprays each nostril qd--sample given today. Trial of mucinex DM or robitussin DM otc as directed on the box. May use OTC nasal saline spray or irrigation solution bid. OTC nonsedating antihistamines prn discussed.  Decongestant use discussed--ok if tolerated in the past w/out side effect and if pt has no hx of HTN.

## 2011-10-29 ENCOUNTER — Encounter: Payer: 59 | Attending: Physical Medicine & Rehabilitation

## 2011-10-29 ENCOUNTER — Ambulatory Visit (HOSPITAL_BASED_OUTPATIENT_CLINIC_OR_DEPARTMENT_OTHER): Payer: 59 | Admitting: Physical Medicine & Rehabilitation

## 2011-10-29 ENCOUNTER — Encounter: Payer: Self-pay | Admitting: Family Medicine

## 2011-10-29 DIAGNOSIS — M47812 Spondylosis without myelopathy or radiculopathy, cervical region: Secondary | ICD-10-CM | POA: Insufficient documentation

## 2011-10-29 DIAGNOSIS — R51 Headache: Secondary | ICD-10-CM | POA: Insufficient documentation

## 2011-10-29 DIAGNOSIS — IMO0001 Reserved for inherently not codable concepts without codable children: Secondary | ICD-10-CM

## 2011-10-29 DIAGNOSIS — M5030 Other cervical disc degeneration, unspecified cervical region: Secondary | ICD-10-CM | POA: Insufficient documentation

## 2011-10-29 NOTE — Assessment & Plan Note (Signed)
A 45 year old female with chronic neck pain.  She has undergone cervical medial branch blocks which gave her temporary relief x2, was then referred for cervical radiofrequency neurotomy, however insurance did not approve this.  She underwent epidural injection which gave her some relief initially, a repeat injection was not as effective.  She has had MRI of the C-spine in both 2008, again in 2012 showing mild disk bulge C4-5, C5-6 which really did not progress over time compared to 2008.  She has had no upper extremity symptomatology.  She has gone through physical therapy, has not tried any traction.  MEDICATIONS:  She has switched from Tylenol to Excedrin.  I did caution her not to take Aleve or ibuprofen along with Excedrin, this is an anti- inflammatory.  She has tried tramadol in the past, did not tolerate from GI standpoint.  In terms of sleep, has some difficulty getting comfortable at night. She is employed 40 hours a week.  REVIEW OF SYSTEMS:  Otherwise negative.  OBJECTIVE:  VITAL SIGNS:  Blood pressure 125/70, pulse 71, respirations 16, O2 sat 97% on room air, height 5 feet 5 inches, weight 140 pounds. GENERAL:  No acute distress.  Mood and affect appropriate.  Her gait is normal. MUSCULOSKELETAL:  Her neck has no tenderness to palpation.  Cervical spine range of motion limited in terms of right-sided rotation.  Her upper extremity strength is normal in deltoid biceps, triceps, grip. Upper extremity sensation and deep tendon reflexes are normal.  IMPRESSION: 1. Cervicalgia.  She has some cervical spondylosis, facet arthropathy.     I do think she would benefit from radiofrequency neurotomy,     however, there is some type of insurance with this. 2. Certainly,  no radicular signs.  Overall, she is doing quite well     on minimal medications.  I would like to see her back in a couple     of months, reassess, and go from there.  I discussed with patient,     agrees with  plan.     Erick Colace, M.D. Electronically Signed    AEK/MedQ D:  10/29/2011 12:34:03  T:  10/29/2011 20:15:10  Job #:  [id number]  cc:   Herminio Heads, MD Fax: [phone]

## 2011-11-20 ENCOUNTER — Encounter: Payer: Self-pay | Admitting: Family Medicine

## 2011-12-12 ENCOUNTER — Other Ambulatory Visit (HOSPITAL_COMMUNITY)
Admission: RE | Admit: 2011-12-12 | Discharge: 2011-12-12 | Disposition: A | Discharge status: Home / Self Care | Payer: 59 | Source: Ambulatory Visit | Attending: Obstetrics and Gynecology | Admitting: Obstetrics and Gynecology

## 2011-12-12 ENCOUNTER — Other Ambulatory Visit: Payer: Self-pay | Admitting: Obstetrics and Gynecology

## 2011-12-12 DIAGNOSIS — Z01419 Encounter for gynecological examination (general) (routine) without abnormal findings: Secondary | ICD-10-CM | POA: Insufficient documentation

## 2011-12-16 ENCOUNTER — Other Ambulatory Visit: Payer: Self-pay | Admitting: Obstetrics and Gynecology

## 2011-12-16 DIAGNOSIS — Z1231 Encounter for screening mammogram for malignant neoplasm of breast: Secondary | ICD-10-CM

## 2011-12-31 ENCOUNTER — Ambulatory Visit
Admission: RE | Admit: 2011-12-31 | Discharge: 2011-12-31 | Disposition: A | Discharge status: Home / Self Care | Payer: 59 | Source: Ambulatory Visit | Attending: Obstetrics and Gynecology | Admitting: Obstetrics and Gynecology

## 2011-12-31 DIAGNOSIS — Z1231 Encounter for screening mammogram for malignant neoplasm of breast: Secondary | ICD-10-CM

## 2012-01-01 ENCOUNTER — Other Ambulatory Visit: Payer: Self-pay | Admitting: Obstetrics and Gynecology

## 2012-01-03 ENCOUNTER — Other Ambulatory Visit: Payer: Self-pay | Admitting: Obstetrics and Gynecology

## 2012-01-03 DIAGNOSIS — R928 Other abnormal and inconclusive findings on diagnostic imaging of breast: Secondary | ICD-10-CM

## 2012-01-13 ENCOUNTER — Ambulatory Visit
Admission: RE | Admit: 2012-01-13 | Discharge: 2012-01-13 | Disposition: A | Discharge status: Home / Self Care | Payer: 59 | Source: Ambulatory Visit | Attending: Obstetrics and Gynecology | Admitting: Obstetrics and Gynecology

## 2012-01-13 DIAGNOSIS — R928 Other abnormal and inconclusive findings on diagnostic imaging of breast: Secondary | ICD-10-CM

## 2012-01-21 ENCOUNTER — Ambulatory Visit: Payer: 59 | Admitting: Physical Medicine & Rehabilitation

## 2012-01-29 ENCOUNTER — Encounter (HOSPITAL_COMMUNITY): Payer: Self-pay | Admitting: Pharmacist

## 2012-02-07 ENCOUNTER — Encounter (HOSPITAL_COMMUNITY)
Admission: RE | Admit: 2012-02-07 | Discharge: 2012-02-07 | Disposition: A | Discharge status: Home / Self Care | Payer: 59 | Source: Ambulatory Visit | Attending: Obstetrics and Gynecology | Admitting: Obstetrics and Gynecology

## 2012-02-07 ENCOUNTER — Encounter (HOSPITAL_COMMUNITY): Payer: Self-pay

## 2012-02-07 LAB — CBC
HCT: 39.5 % (ref 36.0–46.0)
Hemoglobin: 12.8 g/dL (ref 12.0–15.0)
MCH: 28.7 pg (ref 26.0–34.0)
MCHC: 32.4 g/dL (ref 30.0–36.0)
MCV: 88.6 fL (ref 78.0–100.0)
Platelets: 225 10*3/uL (ref 150–400)
RBC: 4.46 MIL/uL (ref 3.87–5.11)
RDW: 12.7 % (ref 11.5–15.5)
WBC: 6.3 10*3/uL (ref 4.0–10.5)

## 2012-02-07 LAB — SURGICAL PCR SCREEN
MRSA, PCR: NEGATIVE
Staphylococcus aureus: POSITIVE — AB

## 2012-02-07 NOTE — Patient Instructions (Addendum)
   Your procedure is scheduled on: Monday March 11th  Enter through the Main Entrance of [facility] at: 6am Pick up the phone at the desk and dial [phone] and inform us of your arrival.  Please call this number if you have any problems the morning of surgery: [phone]  Remember: Do not eat food after midnight: Sunday Do not drink clear liquids after:midnight Sunday Take these medicines the morning of surgery with a SIP OF WATER: none  Do not wear jewelry, make-up, or FINGER nail polish Do not wear lotions, powders, perfumes or deodorant. Do not shave 48 hours prior to surgery. Do not bring valuables to the hospital. Contacts, dentures or bridgework may not be worn into surgery.  Leave suitcase in the car. After Surgery it may be brought to your room. For patients being admitted to the hospital, checkout time is 11:00am the day of discharge.  Patients discharged on the day of surgery will not be allowed to drive home.     Remember to use your hibiclens as instructed.Please shower with 1/2 bottle the evening before your surgery and the other 1/2 bottle the morning of surgery. Neck down avoiding private area.   

## 2012-02-10 ENCOUNTER — Other Ambulatory Visit: Payer: Self-pay | Admitting: Obstetrics and Gynecology

## 2012-02-16 MED ORDER — DEXTROSE 5 % IV SOLN
1.0000 g | INTRAVENOUS | Status: AC
  Administered 2012-02-17: 1 g via INTRAVENOUS
  Filled 2012-02-16: qty 1

## 2012-02-17 ENCOUNTER — Inpatient Hospital Stay (HOSPITAL_COMMUNITY): Payer: 59 | Admitting: Anesthesiology

## 2012-02-17 ENCOUNTER — Ambulatory Visit (HOSPITAL_COMMUNITY)
Admission: RE | Admit: 2012-02-17 | Discharge: 2012-02-18 | Disposition: A | Discharge status: Home Health | Payer: 59 | Source: Ambulatory Visit | Attending: Obstetrics and Gynecology | Admitting: Obstetrics and Gynecology

## 2012-02-17 ENCOUNTER — Encounter (HOSPITAL_COMMUNITY): Payer: Self-pay | Admitting: *Deleted

## 2012-02-17 ENCOUNTER — Encounter (HOSPITAL_COMMUNITY)
Admission: RE | Disposition: A | Discharge status: Home Health | Payer: Self-pay | Source: Ambulatory Visit | Attending: Obstetrics and Gynecology

## 2012-02-17 ENCOUNTER — Encounter (HOSPITAL_COMMUNITY): Payer: Self-pay | Admitting: Anesthesiology

## 2012-02-17 DIAGNOSIS — N920 Excessive and frequent menstruation with regular cycle: Secondary | ICD-10-CM | POA: Insufficient documentation

## 2012-02-17 DIAGNOSIS — Z90710 Acquired absence of both cervix and uterus: Secondary | ICD-10-CM

## 2012-02-17 DIAGNOSIS — D251 Intramural leiomyoma of uterus: Secondary | ICD-10-CM | POA: Insufficient documentation

## 2012-02-17 DIAGNOSIS — Z01818 Encounter for other preprocedural examination: Secondary | ICD-10-CM | POA: Insufficient documentation

## 2012-02-17 DIAGNOSIS — Z01812 Encounter for preprocedural laboratory examination: Secondary | ICD-10-CM | POA: Insufficient documentation

## 2012-02-17 DIAGNOSIS — D252 Subserosal leiomyoma of uterus: Secondary | ICD-10-CM | POA: Insufficient documentation

## 2012-02-17 DIAGNOSIS — D250 Submucous leiomyoma of uterus: Secondary | ICD-10-CM | POA: Insufficient documentation

## 2012-02-17 HISTORY — PX: ROBOTIC ASSISTED LAPAROSCOPIC VAGINAL HYSTERECTOMY WITH FIBROID REMOVAL: SHX5387

## 2012-02-17 LAB — BASIC METABOLIC PANEL
BUN: 17 mg/dL (ref 6–23)
CO2: 25 mEq/L (ref 19–32)
Calcium: 8.4 mg/dL (ref 8.4–10.5)
Chloride: 102 mEq/L (ref 96–112)
Creatinine, Ser: 0.85 mg/dL (ref 0.50–1.10)
GFR calc Af Amer: >90 mL/min (ref >90)
GFR calc non Af Amer: 82 mL/min — ABNORMAL LOW (ref >90)
Glucose, Bld: 104 mg/dL — ABNORMAL HIGH (ref 70–99)
Potassium: 3.4 mEq/L — ABNORMAL LOW (ref 3.5–5.1)
Sodium: 138 mEq/L (ref 135–145)

## 2012-02-17 LAB — PREGNANCY, URINE: Preg Test, Ur: NEGATIVE

## 2012-02-17 SURGERY — ROBOTIC ASSISTED TOTAL HYSTERECTOMY
Anesthesia: General | Site: Abdomen | Wound class: Clean Contaminated

## 2012-02-17 MED ORDER — ROPIVACAINE HCL 5 MG/ML IJ SOLN
INTRAMUSCULAR | Status: DC | PRN
  Administered 2012-02-17: 30 mL via EPIDURAL

## 2012-02-17 MED ORDER — DEXAMETHASONE SODIUM PHOSPHATE 10 MG/ML IJ SOLN
INTRAMUSCULAR | Status: DC | PRN
  Administered 2012-02-17: 10 mg via INTRAVENOUS

## 2012-02-17 MED ORDER — ACETAMINOPHEN 325 MG PO TABS: 325.0000 mg | ORAL_TABLET | ORAL | Status: DC | PRN

## 2012-02-17 MED ORDER — ROPIVACAINE HCL 5 MG/ML IJ SOLN
INTRAMUSCULAR | Status: AC
  Filled 2012-02-17: qty 60

## 2012-02-17 MED ORDER — LACTATED RINGERS IV SOLN
INTRAVENOUS | Status: DC
  Administered 2012-02-17 (×2): via INTRAVENOUS

## 2012-02-17 MED ORDER — ARTIFICIAL TEARS OP OINT
TOPICAL_OINTMENT | OPHTHALMIC | Status: DC | PRN
  Administered 2012-02-17: 1 via OPHTHALMIC

## 2012-02-17 MED ORDER — ONDANSETRON HCL 4 MG/2ML IJ SOLN
INTRAMUSCULAR | Status: AC
  Filled 2012-02-17: qty 2

## 2012-02-17 MED ORDER — SCOPOLAMINE 1 MG/3DAYS TD PT72
1.0000 | MEDICATED_PATCH | Freq: Once | TRANSDERMAL | Status: DC
  Administered 2012-02-17: 1.5 mg via TRANSDERMAL

## 2012-02-17 MED ORDER — LIDOCAINE HCL (CARDIAC) 20 MG/ML IV SOLN
INTRAVENOUS | Status: AC
  Filled 2012-02-17: qty 5

## 2012-02-17 MED ORDER — KETOROLAC TROMETHAMINE 30 MG/ML IJ SOLN
INTRAMUSCULAR | Status: AC
  Administered 2012-02-17: 30 mg via INTRAVENOUS
  Filled 2012-02-17: qty 1

## 2012-02-17 MED ORDER — IBUPROFEN 600 MG PO TABS
600.0000 mg | ORAL_TABLET | Freq: Four times a day (QID) | ORAL | Status: DC | PRN
  Administered 2012-02-18: 600 mg via ORAL
  Filled 2012-02-17: qty 1

## 2012-02-17 MED ORDER — MEPERIDINE HCL 25 MG/ML IJ SOLN
25.0000 mg | Freq: Once | INTRAMUSCULAR | Status: AC
  Administered 2012-02-17: 25 mg via INTRAVENOUS

## 2012-02-17 MED ORDER — MIDAZOLAM HCL 2 MG/2ML IJ SOLN
INTRAMUSCULAR | Status: AC
  Filled 2012-02-17: qty 2

## 2012-02-17 MED ORDER — DEXAMETHASONE SODIUM PHOSPHATE 10 MG/ML IJ SOLN
INTRAMUSCULAR | Status: AC
  Filled 2012-02-17: qty 1

## 2012-02-17 MED ORDER — ROCURONIUM BROMIDE 50 MG/5ML IV SOLN
INTRAVENOUS | Status: AC
  Filled 2012-02-17: qty 2

## 2012-02-17 MED ORDER — GLYCOPYRROLATE 0.2 MG/ML IJ SOLN
INTRAMUSCULAR | Status: DC | PRN
  Administered 2012-02-17: .8 mg via INTRAVENOUS
  Administered 2012-02-17: 0.2 mg via INTRAVENOUS

## 2012-02-17 MED ORDER — FENTANYL CITRATE 0.05 MG/ML IJ SOLN
INTRAMUSCULAR | Status: DC | PRN
  Administered 2012-02-17: 100 ug via INTRAVENOUS
  Administered 2012-02-17 (×3): 50 ug via INTRAVENOUS

## 2012-02-17 MED ORDER — MEPERIDINE HCL 25 MG/ML IJ SOLN
INTRAMUSCULAR | Status: AC
  Administered 2012-02-17: 25 mg via INTRAVENOUS
  Filled 2012-02-17: qty 1

## 2012-02-17 MED ORDER — MIDAZOLAM HCL 5 MG/5ML IJ SOLN
INTRAMUSCULAR | Status: DC | PRN
  Administered 2012-02-17: 2 mg via INTRAVENOUS

## 2012-02-17 MED ORDER — LACTATED RINGERS IR SOLN
Status: DC | PRN
  Administered 2012-02-17: 3000 mL

## 2012-02-17 MED ORDER — OXYCODONE-ACETAMINOPHEN 5-325 MG PO TABS: ORAL_TABLET | ORAL | Status: DC

## 2012-02-17 MED ORDER — PROMETHAZINE HCL 12.5 MG PO TABS: 25.0000 mg | ORAL_TABLET | Freq: Four times a day (QID) | ORAL | Status: AC | PRN

## 2012-02-17 MED ORDER — LIDOCAINE HCL (CARDIAC) 20 MG/ML IV SOLN
INTRAVENOUS | Status: DC | PRN
  Administered 2012-02-17: 60 mg via INTRAVENOUS

## 2012-02-17 MED ORDER — FENTANYL CITRATE 0.05 MG/ML IJ SOLN
INTRAMUSCULAR | Status: AC
  Filled 2012-02-17: qty 5

## 2012-02-17 MED ORDER — OXYCODONE-ACETAMINOPHEN 5-325 MG PO TABS: 1.0000 | ORAL_TABLET | ORAL | Status: DC | PRN

## 2012-02-17 MED ORDER — ROCURONIUM BROMIDE 100 MG/10ML IV SOLN
INTRAVENOUS | Status: DC | PRN
  Administered 2012-02-17: 10 mg via INTRAVENOUS
  Administered 2012-02-17: 20 mg via INTRAVENOUS
  Administered 2012-02-17 (×4): 10 mg via INTRAVENOUS
  Administered 2012-02-17: 50 mg via INTRAVENOUS

## 2012-02-17 MED ORDER — ATROPINE SULFATE 0.4 MG/ML IJ SOLN
INTRAMUSCULAR | Status: AC
  Filled 2012-02-17: qty 1

## 2012-02-17 MED ORDER — KETOROLAC TROMETHAMINE 30 MG/ML IJ SOLN
30.0000 mg | Freq: Once | INTRAMUSCULAR | Status: AC
  Administered 2012-02-17: 30 mg via INTRAVENOUS
  Filled 2012-02-17 (×2): qty 1

## 2012-02-17 MED ORDER — ONDANSETRON HCL 4 MG/2ML IJ SOLN
INTRAMUSCULAR | Status: DC | PRN
  Administered 2012-02-17: 4 mg via INTRAVENOUS

## 2012-02-17 MED ORDER — OXYCODONE-ACETAMINOPHEN 5-325 MG PO TABS
1.0000 | ORAL_TABLET | ORAL | Status: DC | PRN
  Administered 2012-02-18: 1 via ORAL
  Filled 2012-02-17: qty 1

## 2012-02-17 MED ORDER — PROPOFOL 10 MG/ML IV EMUL
INTRAVENOUS | Status: DC | PRN
  Administered 2012-02-17: 150 mg via INTRAVENOUS

## 2012-02-17 MED ORDER — FENTANYL CITRATE 0.05 MG/ML IJ SOLN: 25.0000 ug | INTRAMUSCULAR | Status: DC | PRN

## 2012-02-17 MED ORDER — KETOROLAC TROMETHAMINE 30 MG/ML IJ SOLN
15.0000 mg | Freq: Once | INTRAMUSCULAR | Status: AC | PRN
  Administered 2012-02-17: 30 mg via INTRAVENOUS

## 2012-02-17 MED ORDER — NEOSTIGMINE METHYLSULFATE 1 MG/ML IJ SOLN
INTRAMUSCULAR | Status: DC | PRN
  Administered 2012-02-17: 4 mg via INTRAVENOUS

## 2012-02-17 MED ORDER — PROPOFOL 10 MG/ML IV EMUL
INTRAVENOUS | Status: AC
  Filled 2012-02-17: qty 20

## 2012-02-17 MED ORDER — GLYCOPYRROLATE 0.2 MG/ML IJ SOLN
INTRAMUSCULAR | Status: AC
  Filled 2012-02-17: qty 3

## 2012-02-17 MED ORDER — IBUPROFEN 200 MG PO TABS: 800.0000 mg | ORAL_TABLET | Freq: Three times a day (TID) | ORAL | Status: AC | PRN

## 2012-02-17 MED ORDER — SCOPOLAMINE 1 MG/3DAYS TD PT72
MEDICATED_PATCH | TRANSDERMAL | Status: AC
  Filled 2012-02-17: qty 1

## 2012-02-17 MED ORDER — NEOSTIGMINE METHYLSULFATE 1 MG/ML IJ SOLN
INTRAMUSCULAR | Status: AC
  Filled 2012-02-17: qty 10

## 2012-02-17 MED ORDER — ONDANSETRON HCL 4 MG/2ML IJ SOLN
4.0000 mg | Freq: Four times a day (QID) | INTRAMUSCULAR | Status: DC | PRN
  Administered 2012-02-17 – 2012-02-18 (×2): 4 mg via INTRAVENOUS
  Filled 2012-02-17: qty 2

## 2012-02-17 MED ORDER — ROCURONIUM BROMIDE 50 MG/5ML IV SOLN
INTRAVENOUS | Status: AC
  Filled 2012-02-17: qty 1

## 2012-02-17 SURGICAL SUPPLY — 75 items
APL SKNCLS STERI-STRIP NONHPOA (GAUZE/BANDAGES/DRESSINGS) ×1
BAG URINE DRAINAGE (UROLOGICAL SUPPLIES) ×2 IMPLANT
BARRIER ADHS 3X4 INTERCEED (GAUZE/BANDAGES/DRESSINGS) ×3 IMPLANT
BENZOIN TINCTURE PRP APPL 2/3 (GAUZE/BANDAGES/DRESSINGS) ×2 IMPLANT
BRR ADH 4X3 ABS CNTRL BYND (GAUZE/BANDAGES/DRESSINGS) ×2
CABLE HIGH FREQUENCY MONO STRZ (ELECTRODE) ×2 IMPLANT
CATH FOLEY 3WAY  5CC 16FR (CATHETERS) ×1
CATH FOLEY 3WAY 5CC 16FR (CATHETERS) ×1 IMPLANT
CONT PATH 16OZ SNAP LID 3702 (MISCELLANEOUS) ×2 IMPLANT
COVER MAYO STAND STRL (DRAPES) ×2 IMPLANT
COVER TABLE BACK 60X90 (DRAPES) ×4 IMPLANT
COVER TIP SHEARS 8 DVNC (MISCELLANEOUS) ×1 IMPLANT
COVER TIP SHEARS 8MM DA VINCI (MISCELLANEOUS) ×1
DECANTER SPIKE VIAL GLASS SM (MISCELLANEOUS) ×2 IMPLANT
DRAPE HUG U DISPOSABLE (DRAPE) ×2 IMPLANT
DRAPE LG THREE QUARTER DISP (DRAPES) ×4 IMPLANT
DRAPE MONITOR DA VINCI (DRAPE) IMPLANT
DRAPE WARM FLUID 44X44 (DRAPE) ×2 IMPLANT
ELECT REM PT RETURN 9FT ADLT (ELECTROSURGICAL) ×2
ELECTRODE REM PT RTRN 9FT ADLT (ELECTROSURGICAL) ×1 IMPLANT
EVACUATOR SMOKE 8.L (FILTER) ×2 IMPLANT
GAUZE VASELINE 3X9 (GAUZE/BANDAGES/DRESSINGS) IMPLANT
GLOVE BIO SURGEON STRL SZ 6.5 (GLOVE) ×6 IMPLANT
GLOVE BIOGEL M 6.5 STRL (GLOVE) ×4 IMPLANT
GLOVE BIOGEL PI IND STRL 6.5 (GLOVE) ×2 IMPLANT
GLOVE BIOGEL PI IND STRL 7.0 (GLOVE) ×4 IMPLANT
GLOVE BIOGEL PI INDICATOR 6.5 (GLOVE) ×2
GLOVE BIOGEL PI INDICATOR 7.0 (GLOVE) ×4
GLOVE ECLIPSE 6.5 STRL STRAW (GLOVE) ×8 IMPLANT
GOWN STRL REIN XL XLG (GOWN DISPOSABLE) ×12 IMPLANT
GYRUS RUMI II 2.5CM BLUE (DISPOSABLE)
GYRUS RUMI II 3.5CM BLUE (DISPOSABLE)
GYRUS RUMI II 4.0CM BLUE (DISPOSABLE)
KIT ACCESSORY DA VINCI DISP (KITS) ×1
KIT ACCESSORY DVNC DISP (KITS) ×1 IMPLANT
KIT DISP ACCESSORY 4 ARM (KITS) IMPLANT
NDL INSUFFLATION 14GA 120MM (NEEDLE) ×1 IMPLANT
NEEDLE INSUFFLATION 14GA 120MM (NEEDLE) ×2 IMPLANT
NS IRRIG 1000ML POUR BTL (IV SOLUTION) ×6 IMPLANT
OCCLUDER COLPOPNEUMO (BALLOONS) IMPLANT
PACK LAVH (CUSTOM PROCEDURE TRAY) ×2 IMPLANT
PAD PREP 24X48 CUFFED NSTRL (MISCELLANEOUS) ×4 IMPLANT
PLUG CATH AND CAP STER (CATHETERS) ×2 IMPLANT
PROTECTOR NERVE ULNAR (MISCELLANEOUS) ×4 IMPLANT
RUMI II 3.0CM BLUE KOH-EFFICIE (DISPOSABLE) IMPLANT
RUMI II GYRUS 2.5CM BLUE (DISPOSABLE) IMPLANT
RUMI II GYRUS 3.5CM BLUE (DISPOSABLE) IMPLANT
RUMI II GYRUS 4.0CM BLUE (DISPOSABLE) IMPLANT
SET CYSTO W/LG BORE CLAMP LF (SET/KITS/TRAYS/PACK) ×2 IMPLANT
SET IRRIG TUBING LAPAROSCOPIC (IRRIGATION / IRRIGATOR) ×2 IMPLANT
SOLUTION ELECTROLUBE (MISCELLANEOUS) ×2 IMPLANT
SPONGE LAP 18X18 X RAY DECT (DISPOSABLE) IMPLANT
STRIP CLOSURE SKIN 1/4X4 (GAUZE/BANDAGES/DRESSINGS) ×2 IMPLANT
SUT VIC AB 0 CT1 27 (SUTURE) ×14
SUT VIC AB 0 CT1 27XBRD ANBCTR (SUTURE) ×1 IMPLANT
SUT VIC AB 0 CT1 27XBRD ANTBC (SUTURE) ×5 IMPLANT
SUT VICRYL 0 27 CT2 27 ABS (SUTURE) ×5 IMPLANT
SUT VICRYL 0 UR6 27IN ABS (SUTURE) ×2 IMPLANT
SUT VICRYL RAPIDE 4/0 PS 2 (SUTURE) ×4 IMPLANT
SYR 30ML LL (SYRINGE) ×2 IMPLANT
SYR 50ML LL SCALE MARK (SYRINGE) ×2 IMPLANT
SYSTEM CONVERTIBLE TROCAR (TROCAR) IMPLANT
TIP UTERINE 5.1X6CM LAV DISP (MISCELLANEOUS) IMPLANT
TIP UTERINE 6.7X10CM GRN DISP (MISCELLANEOUS) IMPLANT
TIP UTERINE 6.7X6CM WHT DISP (MISCELLANEOUS) IMPLANT
TIP UTERINE 6.7X8CM BLUE DISP (MISCELLANEOUS) ×1 IMPLANT
TOWEL OR 17X24 6PK STRL BLUE (TOWEL DISPOSABLE) ×6 IMPLANT
TROCAR 12M 150ML BLUNT (TROCAR) IMPLANT
TROCAR 5M 150ML BLDLS (TROCAR) ×1 IMPLANT
TROCAR DISP BLADELESS 8 DVNC (TROCAR) ×1 IMPLANT
TROCAR DISP BLADELESS 8MM (TROCAR) ×1
TROCAR XCEL 12X100 BLDLESS (ENDOMECHANICALS) ×1 IMPLANT
TROCAR XCEL NON-BLD 5MMX100MML (ENDOMECHANICALS) ×2 IMPLANT
TUBING FILTER THERMOFLATOR (ELECTROSURGICAL) ×2 IMPLANT
WARMER LAPAROSCOPE (MISCELLANEOUS) ×2 IMPLANT

## 2012-02-17 NOTE — Anesthesia Preprocedure Evaluation (Addendum)
Anesthesia Evaluation  Patient identified by MRN, date of birth, ID band Patient awake    Reviewed: Allergy & Precautions, H&P , Patient's Chart, lab work & pertinent test results, reviewed documented beta blocker date and time   History of Anesthesia Complications (+) PONV  Airway Mallampati: II TM Distance: >3 FB Neck ROM: full    Dental No notable dental hx.    Pulmonary neg pulmonary ROS,  breath sounds clear to auscultation  Pulmonary exam normal       Cardiovascular Exercise Tolerance: Good negative cardio ROS  + Valvular Problems/Murmurs Rhythm:regular Rate:Normal     Neuro/Psych  Headaches, negative neurological ROS  negative psych ROS   GI/Hepatic negative GI ROS, Neg liver ROS,   Endo/Other  negative endocrine ROS  Renal/GU negative Renal ROS     Musculoskeletal   Abdominal   Peds  Hematology negative hematology ROS (+)   Anesthesia Other Findings PONV (postoperative nausea and vomiting)     Cervical spondylosis 2011 w/out myelopathy; nerve block injections done 08/2010 and 10/2010 (Dr. Wynn Banker)    Migraine   plus tension HA's; some occipital HA's associated with her cervicalgia Heart murmur   Pt reports this is present intermittently, says past ECHO normal.    Fibroids, submucosal   u/s 07/2010 Menorrhagia    Reproductive/Obstetrics negative OB ROS                           Anesthesia Physical Anesthesia Plan  ASA: II  Anesthesia Plan: General ETT   Post-op Pain Management:    Induction: Intravenous  Airway Management Planned: Oral ETT and Video Laryngoscope Planned  Additional Equipment:   Intra-op Plan:   Post-operative Plan:   Informed Consent: I have reviewed the patients History and Physical, chart, labs and discussed the procedure including the risks, benefits and alternatives for the proposed anesthesia with the patient or authorized representative who has  indicated his/her understanding and acceptance.   Dental Advisory Given  Plan Discussed with: CRNA and Surgeon  Anesthesia Plan Comments:        Discussed risk of worsening cervical disc issues at length. Pt understands and her questions were answered. Anesthesia Quick Evaluation

## 2012-02-17 NOTE — Anesthesia Postprocedure Evaluation (Signed)
Anesthesia Post Note  Patient: Sara Burton  Procedure(s) Performed: Procedure(s) (LRB): ROBOTIC ASSISTED TOTAL HYSTERECTOMY (N/A)  Anesthesia type: GA  Patient location: PACU  Post pain: Pain level controlled  Post assessment: Post-op Vital signs reviewed  Last Vitals:  Filed Vitals:   02/17/12 0610  BP: 121/73  Pulse: 75  Temp: 36.9 C  Resp: 16    Post vital signs: Reviewed  Level of consciousness: sedated  Complications: No apparent anesthesia complications

## 2012-02-17 NOTE — Transfer of Care (Signed)
  Patient: Sara Burton  Procedure(s) Performed: Procedure(s) (LRB):  ROBOTIC ASSISTED TOTAL HYSTERECTOMY (N/A)  Patient Location: PACU  Anesthesia Type: General  Level of Consciousness: awake, alert and sedated  Airway and Oxygen Therapy: Patient Spontanous Breathing and Patient connected to nasal cannula oxygen  Post-op Pain: none  Post-op Assessment: Post-op Vital signs reviewed and Patient's Cardiovascular Status Stable  Post-op Vital Signs: Reviewed and stable  Complications: No apparent anesthesia complications

## 2012-02-17 NOTE — Anesthesia Procedure Notes (Signed)
Procedure Name: Intubation Date/Time: 02/17/2012 7:38 AM Performed by: Graciela Husbands Pre-anesthesia Checklist: Suction available, Emergency Drugs available, Timeout performed, Patient identified and Patient being monitored Patient Re-evaluated:Patient Re-evaluated prior to inductionOxygen Delivery Method: Circle system utilized Preoxygenation: Pre-oxygenation with 100% oxygen Intubation Type: IV induction Ventilation: Mask ventilation without difficulty Laryngoscope Size: 3 (glide scope blade) Grade View: Grade I Tube type: Oral Tube size: 7.0 mm Number of attempts: 1 Airway Equipment and Method: Stylet and Video-laryngoscopy Placement Confirmation: ETT inserted through vocal cords under direct vision,  positive ETCO2 and breath sounds checked- equal and bilateral Secured at: 21 cm Tube secured with: Tape Dental Injury: Teeth and Oropharynx as per pre-operative assessment  Difficulty Due To: Difficult Airway- due to reduced neck mobility

## 2012-02-17 NOTE — H&P (Signed)
Date of Initial H&P: 02/11/2012 History reviewed, patient examined, no change in status, stable for surgery.

## 2012-02-17 NOTE — Anesthesia Postprocedure Evaluation (Signed)
  Anesthesia Post-op Note  Patient: Sara Burton  Procedure(s) Performed: Procedure(s) (LRB): ROBOTIC ASSISTED TOTAL HYSTERECTOMY (N/A)  [facility]  Anesthesia Type: General  Level of Consciousness: alert  and oriented  Airway and Oxygen Therapy: Patient Spontanous Breathing  Post-op Pain: mild  Post-op Assessment: Patient's Cardiovascular Status Stable and Respiratory Function Stable  Post-op Vital Signs: stable  Complications: No apparent anesthesia complications

## 2012-02-17 NOTE — Op Note (Signed)
02/17/2012  11:56 AM  PATIENT:  Sara Burton  46 y.o. female  PRE-OPERATIVE DIAGNOSIS:  Menorrhagia and Uterine fibroids  POST-OPERATIVE DIAGNOSIS:  Menorrhagia and Uterine fibroids   PROCEDURE:  Procedure(s) (LRB): ROBOTIC ASSISTED TOTAL HYSTERECTOMY (N/A)  SURGEON:  Surgeon(s) and Role:    * Tawnie Ehresman J. Richardson Dopp, MD - Primary    * Alison Murray, MD    * Geryl Rankins, MD - Assisting  PHYSICIAN ASSISTANT:NA   ASSISTANTS: Dr. Geryl Rankins   ANESTHESIA:   general  Findings. Adhesions of the omentum to the anterior abdominal wall. Multiple fibroid uterus  With 5 cm left pedunculated fibroid. Normal fallopian Tubes and Ovaries.   EBL:125   Total I/O In: 1000 [I.V.:1000] Out: 325 [Urine:200; Blood:125]  BLOOD ADMINISTERED:none  DRAINS: Urinary Catheter (Foley)   LOCAL MEDICATIONS USED:  OTHER Ropivacaine Protocol  SPECIMEN:  Source of Specimen:  Uterus and Cervix   DISPOSITION OF SPECIMEN:  PATHOLOGY  COUNTS:  YES  TOURNIQUET:  * No tourniquets in log *  DICTATION: .Other Dictation: Dictation Number   PLAN OF CARE: Discharge to home after PACU  PATIENT DISPOSITION:  PACU - hemodynamically stable.   Delay start of Pharmacological VTE agent (>24hrs) due to surgical blood loss or risk of bleeding: not applicable  Procedure: the patient was taken to the operating room where general anesthesia was obtained. The patient was then examined under anesthesia and found to have a 14 wk size uterus. She was placed in the dorsal lithotomy position and prepped and draped in a sterile fashion. A weighted speculum was placed into the vagina. A dever was placed anteriorly for retraction. The anterior lip of the cervix was grasped with a single tooth tenaculum. The vagina mucosa was injected with 2.5 ml of Ropivacaine at the 10, 2, 4 and 8 o'clock positions. The uterus was sounded to 8.5 cm. The cervix was dilated to 6 mm. A suture was placed at the 12 and 6 o'clock positions of  the cervix to facilitate placement of the RUMI uterine  manipulator. The Rumi manipulator was placed without difficulty. The weighted speculum and dever were removed. The vaginal occluder was insufflated.    Attention was turned to the patients abdomen where a 12 mm skin incision was made approximately 2 cm above the umbilicus. The veres needle was carefully introduced into the peritoneal cavity while tenting the abdominal wall. Intraperitoneal placement was confirmed by use of a water-filled syringe and drop in intraabdominal pressure with insufflation of CO2 gas. The veres needle was removed and a 12 mm trocar was advanced into the abdomen Intraabdominal placement was confirmed with the laparoscope. The laparoscope was removed and 60 cc of Ropivacaine was injected into the abdominal cavity. The laparoscope was then reinserted. At this point all trocar sites were determined , marked,  Injected with 10 cc of ropivacaine and placed under direct visualization. . A 12 mm skin incision was made in the upper left quadrant at the mid clavicular position.  A 12 mm accessory port was placed in the upper left quadrant at the mid clavicular position under direct visualization. An 8 mm trocar was placed in the later left upper quadrant. And later connected to robotic arm #2. Attention was turned to the right upper quadrant where an 8 mm mid clavicular trocar (later connected to  robotic Arm #1) was placed and a lateral 8 mm trocar was placed later connected to robotic arm #3). The patient was placed in steep trendelenburg.   Once  all ports were placed under direct visualization, the laparoscope was removed and  the DaVinci Robotic system was then right side docked.The arms were connected to the corresponding trocars as listed above. The laparoscope was then reinserted. The PK bipolar cautery was placed into port #1. The Monopolar scissors were placed into port #2 and pro-grasper was placed into port #3 . All instruments  were directed into the pelvis under direct visualization.    Attention was turned to the surgeons console. An adhesion of the omentum to the anterior abdominal wall was excised using monopolar scissors. The ureters were identified bilaterally. The right utero- ovarian ligament was cauterized with the PK and excised with scissors. The broad ligament was cauterized with the PK and excised with scissors. The round ligament was cauterized with the PK and excised with scissors. The anterior leaf of the broad ligament was incised along the bladder reflection to the midline.    The left  utero- ovarian ligament was cauterized with the PK and excised with scissors. The left broad ligament was cauterized with the PK and excised with scissors. The left  round ligament was cauterized with the PK and excised with scissors. The anterior leaf of the broad ligament was incised along the bladder reflection to the midline. The bladder was dissected off the lower uterine segment and the cervix via sharp and blunt dissection.   The uterine arteries were skeltonized bilaterally. They were cauterized with the PK and transected. The KOH ring was  Identified. The anterior colpotomy was performed followed by the posterior colpotomy. The uterus and specimen were withdrawn into the vagina and sent to pathology. The PK and scissors were removed. Long tip forceps were placed into port #1 and cutting needle driver was placed into port #2. The vaginal cuff angles were closed with figure of eight stitches of 0 vicryl. The remainder of the vaginal cuff was closed interrupted 0 vicryl figure- of -eight sutures. The pelvis was irrigated using the accessory port. Excellent hemostasis was noted.    All pedicles were examined and hemostasis was noted. The ureters were identified bilaterally and noted to periostalis. Interceed was placed along the vaginal cuff. All instruments were removed from there ports. All ports were removed under direct  visualization.The pneumopertonium was released.  The fascia of the 12 mm ports were closed with 0 vicryl. The skin incisions were closed with 4-0 vicryl  And derma bond.   Sponge lap and needle counts were correct x 2. The patient was awakened from anesthesia and taken to the recovery room in stable condition.

## 2012-02-17 NOTE — Anesthesia Postprocedure Evaluation (Signed)
  Anesthesia Post-op Note  Patient: Sara Burton  Procedure(s) Performed: Procedure(s) (LRB): ROBOTIC ASSISTED TOTAL HYSTERECTOMY (N/A)  Patient Location: PACU  Anesthesia Type: General  Level of Consciousness: awake, alert  and sedated  Airway and Oxygen Therapy: Patient Spontanous Breathing and Patient connected to nasal cannula oxygen  Post-op Pain: none  Post-op Assessment: Post-op Vital signs reviewed and Patient's Cardiovascular Status Stable  Post-op Vital Signs: Reviewed and stable  Complications: No apparent anesthesia complications

## 2012-02-17 NOTE — Addendum Note (Signed)
Addendum  created 02/17/12 1526 by Renford Dills, CRNA   Modules edited:Notes Section

## 2012-02-18 ENCOUNTER — Encounter: Payer: Self-pay | Admitting: Family Medicine

## 2012-02-18 MED ORDER — IBUPROFEN 600 MG PO TABS: 600.0000 mg | ORAL_TABLET | Freq: Four times a day (QID) | ORAL | Status: AC | PRN

## 2012-02-18 MED ORDER — OXYCODONE-ACETAMINOPHEN 5-325 MG PO TABS: 1.0000 | ORAL_TABLET | Freq: Four times a day (QID) | ORAL | Status: AC | PRN

## 2012-02-18 NOTE — Progress Notes (Signed)
Subjective: Patient reports tolerating PO and no problems voiding.    Objective: I have reviewed patient's vital signs, intake and output, medications and labs.  General: alert and cooperative GI: soft, non-tender; bowel sounds normal; no masses,  no organomegaly Extremities: extremities normal, atraumatic, no cyanosis or edema   Assessment/Plan: POD #1 s/p Robotic assisted total hysterectomy doing well.. D/c home follow up in office in 2 wks   LOS: 1 day    Alasdair Kleve J. 02/18/2012, 9:22 AM

## 2012-02-18 NOTE — Progress Notes (Signed)
Pt ambulated out  Teaching complete  Questions answered  Family with pt

## 2012-10-03 ENCOUNTER — Emergency Department
Admission: EM | Admit: 2012-10-03 | Discharge: 2012-10-03 | Disposition: A | Discharge status: Home / Self Care | Payer: 59 | Source: Home / Self Care | Attending: Family Medicine | Admitting: Family Medicine

## 2012-10-03 ENCOUNTER — Encounter: Payer: Self-pay | Admitting: Emergency Medicine

## 2012-10-03 DIAGNOSIS — M2669 Other specified disorders of temporomandibular joint: Secondary | ICD-10-CM

## 2012-10-03 DIAGNOSIS — M26629 Arthralgia of temporomandibular joint, unspecified side: Secondary | ICD-10-CM

## 2012-10-03 HISTORY — DX: Unspecified otitis externa, bilateral: H60.93

## 2012-10-03 MED ORDER — NAPROXEN 500 MG PO TABS: 500.0000 mg | ORAL_TABLET | Freq: Two times a day (BID) | ORAL | Status: DC

## 2012-10-03 NOTE — ED Notes (Signed)
Patient reports pain in right ear x 3 days; has hx of ear infections and knows to seek help at this point. Has some sinus congestion.

## 2012-10-03 NOTE — ED Provider Notes (Signed)
History     CSN: [id number]  Arrival date & time 10/03/12  1630   First MD Initiated Contact with Patient 10/03/12 1749      Chief Complaint  Patient presents with  . Otalgia     HPI Comments: Patient reports pain in right ear x 3 days; has hx of ear infections and knows to seek help at this point. Has some sinus congestion, and recalls having had a sore throat about 2 weeks ago (resolved).  No cough or other URI symptoms.  No fevers, chills, and sweats   The history is provided by the patient.    Past Medical History  Diagnosis Date  . Cervical spondylosis 2011    w/out myelopathy; nerve block injections done 08/2010 and 10/2010 (Dr. Wynn Banker)  . Migraine     plus tension HA's; some occipital HA's associated with her cervicalgia  . Heart murmur     Pt reports this is present intermittently, says past ECHO normal.  . Fibroids, submucosal     u/s 07/2010  . Menorrhagia     Endo ablation 02/14/11  . PONV (postoperative nausea and vomiting)   . Bilateral external ear infections     Past Surgical History  Procedure Date  . Cesarean section 02/2004  . Appendectomy 1979  . Finger surgery 1982    Ring finger right hand; no metal/screws in finger.  . Endometrial biopsy 12/2010    Benign, with exogenous hormone effect.  No hyperplasia.  . Endometrial ablation 02/14/11  . Robotic assisted laparoscopic vaginal hysterectomy with fibroid removal 02/17/12    Benign leiomyomata and benign cervix on path    Family History  Problem Relation Age of Onset  . Heart disease Father     CABG in his 35s  . Diabetes Father   . Asthma Brother   . Cancer Paternal Grandmother     Breast; detected in her 47's at the earliest    History  Substance Use Topics  . Smoking status: Never Smoker   . Smokeless tobacco: Never Used  . Alcohol Use: No    OB History    Grav Para Term Preterm Abortions TAB SAB Ect Mult Living                  Review of Systems + sore throat, 2 weeks ago  resolved No cough No pleuritic pain No wheezing + nasal congestion ? post-nasal drainage No sinus pain/pressure No itchy/red eyes + right earache, described as "sharp" No hemoptysis No SOB No fever/chills No nausea No vomiting No abdominal pain No diarrhea No urinary symptoms No skin rashes No fatigue No myalgias No headache Used OTC meds without relief  Allergies  Review of patient's allergies indicates no known allergies.  Home Medications   Current Outpatient Rx  Name Route Sig Dispense Refill  . CETIRIZINE HCL 10 MG PO TABS Oral Take 10 mg by mouth daily.    Marland Kitchen MENTHOL (TOPICAL ANALGESIC) 4 % EX GEL Apply externally Apply 1 application topically as needed. For shoulder or neck pain    . ONE-DAILY MULTI VITAMINS PO TABS Oral Take 1 tablet by mouth daily.      Marland Kitchen NAPROXEN 500 MG PO TABS Oral Take 1 tablet (500 mg total) by mouth 2 (two) times daily. (every 12 hours with food) 20 tablet 1  . OXYCODONE-ACETAMINOPHEN 5-325 MG PO TABS  1-2 po every  6 hrs as needed for severe pain 30 tablet 0    BP 117/76  Pulse  63  Temp 98.1 F (36.7 C) (Oral)  Resp 16  Ht 5\' 5"  (1.651 m)  Wt 140 lb (63.504 kg)  BMI 23.30 kg/m2  SpO2 99%  Physical Exam Nursing notes and Vital Signs reviewed. Appearance:  Patient appears healthy, stated age, and in no acute distress Eyes:  Pupils are equal, round, and reactive to light and accomodation.  Extraocular movement is intact.  Conjunctivae are not inflamed  Ears:  Canals normal.  Tympanic membranes normal.  There is distinct tenderness over the right temporomandibular joint.  Palpation there recreates her pain. Nose:   Normal turbinates.  No sinus tenderness.   Pharynx:  Normal Neck:  Supple.  No adenopathy. Lungs:  Clear to auscultation.  Breath sounds are equal.  Heart:  Regular rate and rhythm without murmurs, rubs, or gallops.  Skin:  No rash present.   ED Course  Procedures  none  Labs Reviewed -  Tympanogram normal right ear;  "noisy" left ear (but appears basically normal)    1. TMJ pain dysfunction syndrome       MDM  Begin Naproxen. Apply ice pack for 15 minutes, 2 or 3 times daily.  Avoid chewy foods. Given a Mickel Crow patient information and instruction sheet on topic TMJ Followup with ENT if not improving        Lattie Haw, MD 10/06/12 [phone]

## 2012-10-11 ENCOUNTER — Emergency Department
Admission: EM | Admit: 2012-10-11 | Discharge: 2012-10-11 | Disposition: A | Discharge status: Home / Self Care | Payer: 59 | Source: Home / Self Care | Attending: Family Medicine | Admitting: Family Medicine

## 2012-10-11 DIAGNOSIS — B029 Zoster without complications: Secondary | ICD-10-CM

## 2012-10-11 MED ORDER — CEPHALEXIN 500 MG PO CAPS: 500.0000 mg | ORAL_CAPSULE | Freq: Three times a day (TID) | ORAL | Status: DC

## 2012-10-11 MED ORDER — VALACYCLOVIR HCL 1 G PO TABS: 1000.0000 mg | ORAL_TABLET | Freq: Three times a day (TID) | ORAL | Status: DC

## 2012-10-11 NOTE — ED Provider Notes (Signed)
History     CSN: [id number]  Arrival date & time 10/11/12  1136   First MD Initiated Contact with Patient 10/11/12 1236      Chief Complaint  Patient presents with  . Otalgia     HPI Comments: States she was seen here two days ago with possible TMJ.  She subsequently developed a burning sensation around her right ear, followed by development of small blisters and swelling to the right ear  Patient is a 46 y.o. female presenting with ear pain. The history is provided by the patient.  Otalgia This is a new problem. Episode onset: 6 days ago. There is pain in the right ear. The problem occurs constantly. The problem has been gradually worsening. There has been no fever. The pain is mild. Associated symptoms include headaches and rash. Pertinent negatives include no ear discharge, no hearing loss, no rhinorrhea, no sore throat, no abdominal pain, no vomiting, no neck pain and no cough.    Past Medical History  Diagnosis Date  . Cervical spondylosis 2011    w/out myelopathy; nerve block injections done 08/2010 and 10/2010 (Dr. Wynn Banker)  . Migraine     plus tension HA's; some occipital HA's associated with her cervicalgia  . Heart murmur     Pt reports this is present intermittently, says past ECHO normal.  . Fibroids, submucosal     u/s 07/2010  . Menorrhagia     Endo ablation 02/14/11  . PONV (postoperative nausea and vomiting)   . Bilateral external ear infections     Past Surgical History  Procedure Date  . Cesarean section 02/2004  . Appendectomy 1979  . Finger surgery 1982    Ring finger right hand; no metal/screws in finger.  . Endometrial biopsy 12/2010    Benign, with exogenous hormone effect.  No hyperplasia.  . Endometrial ablation 02/14/11  . Robotic assisted laparoscopic vaginal hysterectomy with fibroid removal 02/17/12    Benign leiomyomata and benign cervix on path    Family History  Problem Relation Age of Onset  . Heart disease Father     CABG in his 50s  .  Diabetes Father   . Asthma Brother   . Cancer Paternal Grandmother     Breast; detected in her 32's at the earliest    History  Substance Use Topics  . Smoking status: Never Smoker   . Smokeless tobacco: Never Used  . Alcohol Use: No    OB History    Grav Para Term Preterm Abortions TAB SAB Ect Mult Living                  Review of Systems  HENT: Positive for ear pain. Negative for hearing loss, sore throat, rhinorrhea, neck pain and ear discharge.   Respiratory: Negative for cough.   Gastrointestinal: Negative for vomiting and abdominal pain.  Skin: Positive for rash.  Neurological: Positive for headaches.  All other systems reviewed and are negative.    Allergies  Review of patient's allergies indicates no known allergies.  Home Medications   Current Outpatient Rx  Name  Route  Sig  Dispense  Refill  . CEPHALEXIN 500 MG PO CAPS   Oral   Take 1 capsule (500 mg total) by mouth 3 (three) times daily.   21 capsule   0   . CETIRIZINE HCL 10 MG PO TABS   Oral   Take 10 mg by mouth daily.         Marland Kitchen MENTHOL (TOPICAL  ANALGESIC) 4 % EX GEL   Apply externally   Apply 1 application topically as needed. For shoulder or neck pain         . ONE-DAILY MULTI VITAMINS PO TABS   Oral   Take 1 tablet by mouth daily.           Marland Kitchen NAPROXEN 500 MG PO TABS   Oral   Take 1 tablet (500 mg total) by mouth 2 (two) times daily. (every 12 hours with food)   20 tablet   1   . OXYCODONE-ACETAMINOPHEN 5-325 MG PO TABS      1-2 po every  6 hrs as needed for severe pain   30 tablet   0   . VALACYCLOVIR HCL 1 G PO TABS   Oral   Take 1 tablet (1,000 mg total) by mouth 3 (three) times daily.   21 tablet   0     BP 122/73  Pulse 68  Temp 98.3 F (36.8 C) (Oral)  Resp 18  Ht 5\' 5"  (1.651 m)  Wt 148 lb (67.132 kg)  BMI 24.63 kg/m2  SpO2 100%  LMP 01/24/2012  Physical Exam  Nursing note and vitals reviewed. Constitutional: She is oriented to person, place, and  time.  HENT:  Head: Normocephalic and atraumatic.  Left Ear: External ear normal.  Ears:  Nose: Nose normal.  Mouth/Throat: Oropharynx is clear and moist. No oropharyngeal exudate.       Right external ear reveals several small herpetiform lesions as noted on diagram, with mild surrounding tenderness and swelling.  Canal and tympanic membrane appear normal.  Mild post-auricular node tenderness palpated.  There is tenderness over the right temporomandibular joint   Eyes: Conjunctivae normal and EOM are normal. Pupils are equal, round, and reactive to light.  Neck: Neck supple.  Cardiovascular: Normal heart sounds.   Pulmonary/Chest: Breath sounds normal.  Lymphadenopathy:    She has no cervical adenopathy.  Neurological: She is alert and oriented to person, place, and time.  Skin: Skin is warm and dry. Rash noted.    ED Course  Procedures  none      1. Herpes zoster       MDM  Begin Valtrex, and empiric Keflex. Followup with ENT if not improved in about 5 days.Lattie Haw, MD 10/13/12 [phone]

## 2012-10-11 NOTE — ED Notes (Signed)
States she was seen here Saturday with possible TMJ and starting Friday noted with blisters and swelling to the right ear

## 2012-12-22 ENCOUNTER — Other Ambulatory Visit: Payer: Self-pay | Admitting: Obstetrics and Gynecology

## 2012-12-22 DIAGNOSIS — Z1231 Encounter for screening mammogram for malignant neoplasm of breast: Secondary | ICD-10-CM

## 2013-01-15 ENCOUNTER — Ambulatory Visit: Payer: 59

## 2013-01-19 ENCOUNTER — Ambulatory Visit
Admission: RE | Admit: 2013-01-19 | Discharge: 2013-01-19 | Disposition: A | Discharge status: Home / Self Care | Payer: Self-pay | Source: Ambulatory Visit | Attending: Obstetrics and Gynecology | Admitting: Obstetrics and Gynecology

## 2013-01-19 DIAGNOSIS — Z1231 Encounter for screening mammogram for malignant neoplasm of breast: Secondary | ICD-10-CM

## 2013-01-25 ENCOUNTER — Other Ambulatory Visit: Payer: Self-pay | Admitting: Obstetrics and Gynecology

## 2013-01-25 DIAGNOSIS — R928 Other abnormal and inconclusive findings on diagnostic imaging of breast: Secondary | ICD-10-CM

## 2013-01-29 ENCOUNTER — Ambulatory Visit
Admission: RE | Admit: 2013-01-29 | Discharge: 2013-01-29 | Disposition: A | Discharge status: Home / Self Care | Payer: Self-pay | Source: Ambulatory Visit | Attending: Obstetrics and Gynecology | Admitting: Obstetrics and Gynecology

## 2013-01-29 DIAGNOSIS — R928 Other abnormal and inconclusive findings on diagnostic imaging of breast: Secondary | ICD-10-CM

## 2013-05-05 ENCOUNTER — Other Ambulatory Visit (INDEPENDENT_AMBULATORY_CARE_PROVIDER_SITE_OTHER): Payer: 59

## 2013-05-05 ENCOUNTER — Ambulatory Visit (INDEPENDENT_AMBULATORY_CARE_PROVIDER_SITE_OTHER): Payer: 59 | Admitting: *Deleted

## 2013-05-05 DIAGNOSIS — Z0000 Encounter for general adult medical examination without abnormal findings: Secondary | ICD-10-CM

## 2013-05-05 DIAGNOSIS — Z23 Encounter for immunization: Secondary | ICD-10-CM

## 2013-05-05 LAB — COMPREHENSIVE METABOLIC PANEL
ALT: 11 U/L (ref 0–35)
AST: 13 U/L (ref 0–37)
Albumin: 3.9 g/dL (ref 3.5–5.2)
Alkaline Phosphatase: 45 U/L (ref 39–117)
BUN: 17 mg/dL (ref 6–23)
CO2: 27 mEq/L (ref 19–32)
Calcium: 9 mg/dL (ref 8.4–10.5)
Chloride: 105 mEq/L (ref 96–112)
Creatinine, Ser: 1 mg/dL (ref 0.4–1.2)
GFR: 67.39 mL/min (ref >60.00)
Glucose, Bld: 87 mg/dL (ref 70–99)
Potassium: 4.5 mEq/L (ref 3.5–5.1)
Sodium: 136 mEq/L (ref 135–145)
Total Bilirubin: 1.1 mg/dL (ref 0.3–1.2)
Total Protein: 6.6 g/dL (ref 6.0–8.3)

## 2013-05-05 LAB — CBC WITH DIFFERENTIAL/PLATELET
Basophils Absolute: 0 10*3/uL (ref 0.0–0.1)
Basophils Relative: 0.6 % (ref 0.0–3.0)
Eosinophils Absolute: 0.1 10*3/uL (ref 0.0–0.7)
Eosinophils Relative: 2.8 % (ref 0.0–5.0)
HCT: 39.9 % (ref 36.0–46.0)
Hemoglobin: 13.6 g/dL (ref 12.0–15.0)
Lymphocytes Relative: 36.8 % (ref 12.0–46.0)
Lymphs Abs: 2 10*3/uL (ref 0.7–4.0)
MCHC: 34 g/dL (ref 30.0–36.0)
MCV: 91.5 fl (ref 78.0–100.0)
Monocytes Absolute: 0.3 10*3/uL (ref 0.1–1.0)
Monocytes Relative: 5.4 % (ref 3.0–12.0)
Neutro Abs: 2.9 10*3/uL (ref 1.4–7.7)
Neutrophils Relative %: 54.4 % (ref 43.0–77.0)
Platelets: 220 10*3/uL (ref 150.0–400.0)
RBC: 4.36 Mil/uL (ref 3.87–5.11)
RDW: 13.1 % (ref 11.5–14.6)
WBC: 5.3 10*3/uL (ref 4.5–10.5)

## 2013-05-05 LAB — LIPID PANEL
Cholesterol: 164 mg/dL (ref 0–200)
HDL: 43.4 mg/dL (ref >39.00)
LDL Cholesterol: 105 mg/dL — ABNORMAL HIGH (ref 0–99)
Total CHOL/HDL Ratio: 4
Triglycerides: 79 mg/dL (ref 0.0–149.0)
VLDL: 15.8 mg/dL (ref 0.0–40.0)

## 2013-05-05 LAB — TSH: TSH: 1.69 u[IU]/mL (ref 0.35–5.50)

## 2013-05-05 NOTE — Progress Notes (Signed)
  Subjective:    Patient ID: Sara Burton, female    DOB: 06-Sep-1966, 47 y.o.   MRN: [id number]  HPI    Review of Systems     Objective:   Physical Exam        Assessment & Plan:  Patient presented for PPD placement and tolerated; informed of CDC regulations of 48-72 hour window for reading and [7] day waiting period if missed/SLS

## 2013-05-05 NOTE — Progress Notes (Signed)
Labs only

## 2013-05-07 ENCOUNTER — Encounter: Payer: Self-pay | Admitting: Family Medicine

## 2013-05-07 ENCOUNTER — Ambulatory Visit (INDEPENDENT_AMBULATORY_CARE_PROVIDER_SITE_OTHER): Payer: 59 | Admitting: Family Medicine

## 2013-05-07 VITALS — BP 127/85 | HR 70 | Temp 98.0°F | Resp 14 | Ht 65.25 in | Wt 140.8 lb

## 2013-05-07 DIAGNOSIS — IMO0002 Reserved for concepts with insufficient information to code with codable children: Secondary | ICD-10-CM

## 2013-05-07 DIAGNOSIS — Z23 Encounter for immunization: Secondary | ICD-10-CM

## 2013-05-07 DIAGNOSIS — J45909 Unspecified asthma, uncomplicated: Secondary | ICD-10-CM

## 2013-05-07 DIAGNOSIS — Z0000 Encounter for general adult medical examination without abnormal findings: Secondary | ICD-10-CM

## 2013-05-07 LAB — TB SKIN TEST
Induration: 0 mm
TB Skin Test: NEGATIVE

## 2013-05-07 MED ORDER — ALBUTEROL SULFATE HFA 108 (90 BASE) MCG/ACT IN AERS: 2.0000 | INHALATION_SPRAY | Freq: Four times a day (QID) | RESPIRATORY_TRACT | Status: DC | PRN

## 2013-05-07 NOTE — Patient Instructions (Signed)

## 2013-05-07 NOTE — Progress Notes (Signed)
Office Note 05/10/2013  CC:  Chief Complaint  Patient presents with  . Annual Exam    Needs PPD read.    HPI:  Sara Burton is a 47 y.o. White female who is here for CPE.  She gets breast/pelvic/pap through her GYN and these are UTD. She is switching jobs: has health exam certificate for me to fill out.  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 Co schools soon.  She complains of about 6 mo of tendency towards episodes of feeling chest tightness and shortness of breath with wheezing. She thinks it has been happening subtly for years but never bad enough to mention to an MD.  Triggers seem to be exercise and nasal allergy sx's. Denies fevers or chest pain with these episodes.    We reviewed her recent fasting labs today: all normal.    Past Medical History  Diagnosis Date  . Cervical spondylosis 2011    w/out myelopathy; nerve block injections done 08/2010 and 10/2010 (Dr. Wynn Banker)  . Migraine     plus tension HA's; some occipital HA's associated with her cervicalgia  . Heart murmur     Pt reports this is present intermittently, says past ECHO normal.  . Fibroids, submucosal     u/s 07/2010  . Menorrhagia     Endo ablation 02/14/11  . PONV (postoperative nausea and vomiting)   . Bilateral external ear infections   . ALLERGIC RHINITIS 03/31/2010    Qualifier: Diagnosis of  By: Thurmond Butts MD, Dennard Nip      Past Surgical History  Procedure Laterality Date  . Cesarean section  02/2004  . Appendectomy  1979  . Finger surgery  1982    Ring finger right hand; no metal/screws in finger.  . Endometrial biopsy  12/2010    Benign, with exogenous hormone effect.  No hyperplasia.  . Endometrial ablation  02/14/11  . Robotic assisted laparoscopic vaginal hysterectomy with fibroid removal  02/17/12    Benign leiomyomata and benign cervix on path    Family History  Problem Relation Age of Onset  . Heart disease Father     CABG in his 45s  . Diabetes Father   . Asthma  Brother   . Cancer Paternal Grandmother     Breast; detected in her 65's at the earliest    History   Social History  . Marital Status: Married    Spouse Name: N/A    Number of Children: N/A  . Years of Education: N/A   Occupational History  . Not on file.   Social History Main Topics  . Smoking status: Never Smoker   . Smokeless tobacco: Never Used  . Alcohol Use: No  . Drug Use: No  . Sexually Active: Yes   Other Topics Concern  . Not on file   Social History Narrative   Married, one son.   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   No T/A/Ds.   Has one brother and 1 sister.    Outpatient Prescriptions Prior to Visit  Medication Sig Dispense Refill  . cetirizine (ZYRTEC) 10 MG tablet Take 10 mg by mouth daily.      . Menthol, Topical Analgesic, (BIOFREEZE) 4 % GEL Apply 1 application topically as needed. For shoulder or neck pain      . Multiple Vitamin (MULTIVITAMIN) tablet Take 1 tablet by mouth daily.        . naproxen (NAPROSYN) 500 MG tablet Take 1  tablet (500 mg total) by mouth 2 (two) times daily. (every 12 hours with food)  20 tablet  1  . cephALEXin (KEFLEX) 500 MG capsule Take 1 capsule (500 mg total) by mouth 3 (three) times daily.  21 capsule  0  . oxyCODONE-acetaminophen (ROXICET) 5-325 MG per tablet 1-2 po every  6 hrs as needed for severe pain  30 tablet  0  . valACYclovir (VALTREX) 1000 MG tablet Take 1 tablet (1,000 mg total) by mouth 3 (three) times daily.  21 tablet  0   No facility-administered medications prior to visit.  **Pt no longer taking oxycodone, cephalexin, or valtrex.  No Known Allergies  ROS Review of Systems  Constitutional: Negative for fever, chills, appetite change and fatigue.  HENT: Negative for ear pain, congestion, sore throat, neck stiffness and dental problem.   Eyes: Negative for discharge, redness and visual disturbance.  Respiratory: Negative for cough.        See HPI   Cardiovascular: Negative for chest pain, palpitations and leg swelling.  Gastrointestinal: Negative for nausea, vomiting, abdominal pain, diarrhea and blood in stool.  Genitourinary: Negative for dysuria, urgency, frequency, hematuria, flank pain and difficulty urinating.  Musculoskeletal: Negative for myalgias, back pain, joint swelling and arthralgias.  Skin: Negative for pallor and rash.  Neurological: Negative for dizziness, speech difficulty, weakness and headaches.  Hematological: Negative for adenopathy. Does not bruise/bleed easily.  Psychiatric/Behavioral: Negative for confusion and sleep disturbance. The patient is not nervous/anxious.     PE; Blood pressure 127/85, pulse 70, temperature 98 F (36.7 C), temperature source Oral, resp. rate 14, height 5' 5.25" (1.657 m), weight 140 lb 12 oz (63.844 kg), last menstrual period 01/24/2012, SpO2 98.00%. Pt examined with CMA Burnard Leigh as chaperone. Gen: Alert, well appearing.  Patient is oriented to person, place, time, and situation. AFFECT: pleasant, lucid thought and speech. ENT: Ears: EACs clear, normal epithelium.  TMs with good light reflex and landmarks bilaterally.  Eyes: no injection, icteris, swelling, or exudate.  EOMI, PERRLA. Nose: no drainage or turbinate edema/swelling.  No injection or focal lesion.  Mouth: lips without lesion/swelling.  Oral mucosa pink and moist.  Dentition intact and without obvious caries or gingival swelling.  Oropharynx without erythema, exudate, or swelling.  Neck: supple/nontender.  No LAD, mass, or TM.  Carotid pulses 2+ bilaterally, without bruits. CV: RRR, no m/r/g.   LUNGS: CTA bilat, nonlabored resps, good aeration in all lung fields.  Forced expiratory maneuver elicited no wheeze or post-exhalation coughing. ABD: soft, NT, ND, BS normal.  No hepatospenomegaly or mass.  No bruits. EXT: no clubbing, cyanosis, or edema.  Musculoskeletal: no joint swelling, erythema, warmth, or tenderness.   ROM of all joints intact. Skin - no sores or suspicious lesions or rashes or color changes Neuro: CN 2-12 intact bilaterally, strength 5/5 in proximal and distal upper extremities and lower extremities bilaterally.  No sensory deficits.  No tremor.  No disdiadochokinesis.  No ataxia.  Upper extremity and lower extremity DTRs symmetric.  No pronator drift.  Pertinent labs:  Lab Results  Component Value Date   TSH 1.69 05/05/2013   Lab Results  Component Value Date   WBC 5.3 05/05/2013   HGB 13.6 05/05/2013   HCT 39.9 05/05/2013   MCV 91.5 05/05/2013   PLT 220.0 05/05/2013   Lab Results  Component Value Date   CREATININE 1.0 05/05/2013   BUN 17 05/05/2013   NA 136 05/05/2013   K 4.5 05/05/2013   CL 105 05/05/2013  CO2 27 05/05/2013   Lab Results  Component Value Date   ALT 11 05/05/2013   AST 13 05/05/2013   ALKPHOS 45 05/05/2013   BILITOT 1.1 05/05/2013   Lab Results  Component Value Date   CHOL 164 05/05/2013   Lab Results  Component Value Date   HDL 43.40 05/05/2013   Lab Results  Component Value Date   LDLCALC 105* 05/05/2013   Lab Results  Component Value Date   TRIG 79.0 05/05/2013   Lab Results  Component Value Date   CHOLHDL 4 05/05/2013   No results found for this basename: PSA   ASSESSMENT AND PLAN:   Persistent asthma Start Proair HFA 2 puffs q4h prn. Therapeutic expectations and side effect profile of medication discussed today.  Patient's questions answered. See back in 2 mo to review sx's, response to Proair, etc.  Health maintenance examination Reviewed age and gender appropriate health maintenance issues (prudent diet, regular exercise, health risks of tobacco and excessive alcohol, use of seatbelts, fire alarms in home, use of sunscreen).  Also reviewed age and gender appropriate health screening as well as vaccine recommendations. Tdap IM today. Calcium and vit D dosing reviewed. Continue regular GYN f/u for breast cancer screening and cervical cancer  screening.  An After Visit Summary was printed and given to the patient.  FOLLOW UP:  Return in about 2 months (around 07/07/2013) for f/u asthma.

## 2013-05-10 ENCOUNTER — Encounter: Payer: Self-pay | Admitting: Family Medicine

## 2013-05-10 DIAGNOSIS — Z0000 Encounter for general adult medical examination without abnormal findings: Secondary | ICD-10-CM | POA: Insufficient documentation

## 2013-05-10 DIAGNOSIS — IMO0002 Reserved for concepts with insufficient information to code with codable children: Secondary | ICD-10-CM | POA: Insufficient documentation

## 2013-05-10 NOTE — Assessment & Plan Note (Signed)
Start Proair HFA 2 puffs q4h prn. Therapeutic expectations and side effect profile of medication discussed today.  Patient's questions answered. See back in 2 mo to review sx's, response to Proair, etc.

## 2013-05-10 NOTE — Assessment & Plan Note (Signed)
Reviewed age and gender appropriate health maintenance issues (prudent diet, regular exercise, health risks of tobacco and excessive alcohol, use of seatbelts, fire alarms in home, use of sunscreen).  Also reviewed age and gender appropriate health screening as well as vaccine recommendations. Tdap IM today. Calcium and vit D dosing reviewed. Continue regular GYN f/u for breast cancer screening and cervical cancer screening.

## 2013-08-18 ENCOUNTER — Ambulatory Visit (INDEPENDENT_AMBULATORY_CARE_PROVIDER_SITE_OTHER): Payer: BC Managed Care – PPO | Admitting: Family Medicine

## 2013-08-18 ENCOUNTER — Encounter: Payer: Self-pay | Admitting: Family Medicine

## 2013-08-18 VITALS — BP 122/85 | HR 72 | Temp 99.5°F | Resp 18 | Ht 65.0 in | Wt 142.0 lb

## 2013-08-18 DIAGNOSIS — J209 Acute bronchitis, unspecified: Secondary | ICD-10-CM | POA: Insufficient documentation

## 2013-08-18 DIAGNOSIS — J019 Acute sinusitis, unspecified: Secondary | ICD-10-CM

## 2013-08-18 MED ORDER — ALBUTEROL SULFATE HFA 108 (90 BASE) MCG/ACT IN AERS: 2.0000 | INHALATION_SPRAY | Freq: Four times a day (QID) | RESPIRATORY_TRACT | Status: DC | PRN

## 2013-08-18 MED ORDER — AMOXICILLIN 875 MG PO TABS: ORAL_TABLET | ORAL | Status: DC

## 2013-08-18 NOTE — Progress Notes (Signed)
OFFICE NOTE  08/18/2013  CC:  Chief Complaint  Patient presents with  . URI    since about the 30th     HPI: Patient is a 47 y.o. Caucasian female who is here for nasal congestion/runny nose, cough.  Onset about 11 days ago, initially had ST and ears hurting but this has resolved.  No fever.  Feels chest tight but no wheezing.  Has used alb HFA a couple of times during this illness.  Nyquil and advil cold/congestion and afrin have been tried.  Made worse by nighttime, talking, activity.  Symptoms staying same some days and get worse on other days.   Pertinent PMH:  Past Medical History  Diagnosis Date  . Cervical spondylosis 2011    w/out myelopathy; nerve block injections done 08/2010 and 10/2010 (Dr. Wynn Banker)  . Migraine     plus tension HA's; some occipital HA's associated with her cervicalgia  . Heart murmur     Pt reports this is present intermittently, says past ECHO normal.  . Fibroids, submucosal     u/s 07/2010  . Menorrhagia     Endo ablation 02/14/11  . PONV (postoperative nausea and vomiting)   . Bilateral external ear infections   . ALLERGIC RHINITIS 03/31/2010    Qualifier: Diagnosis of  By: Thurmond Butts MD, Dennard Nip      MEDS:  ProAir HFA 2 puffs prn, zyrtec 10mg  qd, MVI qd, ca+vit D qd  PE: Blood pressure 122/85, pulse 72, temperature 99.5 F (37.5 C), temperature source Temporal, resp. rate 18, height 5\' 5"  (1.651 m), weight 142 lb (64.411 kg), last menstrual period 01/24/2012, SpO2 97.00%. VS: noted--normal. Gen: alert, NAD, NONTOXIC APPEARING. HEENT: eyes without injection, drainage, or swelling.  Ears: EACs clear, TMs with normal light reflex and landmarks.  Nose: Clear rhinorrhea, with some dried, crusty exudate adherent to mildly injected mucosa.  No purulent d/c.  Mild bilateral ethmoid sinus region TTP.   No facial swelling.  Throat and mouth without focal lesion.  No pharyngial swelling, erythema, or exudate.   Neck: supple, no LAD.   LUNGS: CTA bilat,  nonlabored resps.  Some post-exhalation coughing. CV: RRR, no m/r/g. EXT: no c/c/e SKIN: no rash  LAB: none  IMPRESSION AND PLAN:  Acute rhinosinusitis with bronchitis. RAD sx's are at a minimum--no sign that steroids are needed.   Recommended amoxil 875mg  bid x 10d and refilled her ProAir HFA 1-2 puffs q6h prn. Discussed appropriate symptomatic care, esp regarding avoiding overuse of afrin and STARTING OTC cough med hs like delsym, mucinex dm, or robitussin dm.  FOLLOW UP: prn

## 2013-10-19 ENCOUNTER — Ambulatory Visit (INDEPENDENT_AMBULATORY_CARE_PROVIDER_SITE_OTHER): Payer: BC Managed Care – PPO

## 2013-10-19 DIAGNOSIS — Z23 Encounter for immunization: Secondary | ICD-10-CM

## 2013-11-30 ENCOUNTER — Encounter: Payer: Self-pay | Admitting: Family Medicine

## 2013-11-30 ENCOUNTER — Ambulatory Visit (INDEPENDENT_AMBULATORY_CARE_PROVIDER_SITE_OTHER): Payer: BC Managed Care – PPO | Admitting: Family Medicine

## 2013-11-30 VITALS — BP 124/80 | HR 65 | Temp 99.2°F | Resp 16 | Ht 65.0 in | Wt 144.0 lb

## 2013-11-30 DIAGNOSIS — J019 Acute sinusitis, unspecified: Secondary | ICD-10-CM

## 2013-11-30 MED ORDER — HYDROCODONE-HOMATROPINE 5-1.5 MG/5ML PO SYRP: ORAL_SOLUTION | ORAL | Status: DC

## 2013-11-30 MED ORDER — AMOXICILLIN-POT CLAVULANATE 875-125 MG PO TABS: 1.0000 | ORAL_TABLET | Freq: Two times a day (BID) | ORAL | Status: DC

## 2013-11-30 NOTE — Progress Notes (Signed)
OFFICE NOTE  11/30/2013  CC:  Chief Complaint  Patient presents with  . Nasal Congestion  . Chills     HPI: Patient is a 47 y.o. Caucasian female who is here for 1 wk of feeling slight nasal congestion/runny nose, achy and weak.  Had onset of nasal congestion, on/off ear pain 3d/a.  No fever at home.  +ST, HA, facial pain, cough coming on lately.  No wheezing or use of rescue inhaler.  No n/v/d.    Pertinent PMH:  Past Medical History  Diagnosis Date  . Cervical spondylosis 2011    w/out myelopathy; nerve block injections done 08/2010 and 10/2010 (Dr. Wynn Banker)  . Migraine     plus tension HA's; some occipital HA's associated with her cervicalgia  . Heart murmur     Pt reports this is present intermittently, says past ECHO normal.  . Fibroids, submucosal     u/s 07/2010  . Menorrhagia     Endo ablation 02/14/11  . PONV (postoperative nausea and vomiting)   . Bilateral external ear infections   . ALLERGIC RHINITIS 03/31/2010    Qualifier: Diagnosis of  By: Thurmond Butts MD, Dennard Nip    . Asthma    Past surgical, social, and family history reviewed and no changes noted since last office visit.  MEDS:  Outpatient Prescriptions Prior to Visit  Medication Sig Dispense Refill  . albuterol (PROAIR HFA) 108 (90 BASE) MCG/ACT inhaler Inhale 2 puffs into the lungs every 6 (six) hours as needed for wheezing.  1 Inhaler  0  . Calcium Carb-Cholecalciferol (CALCIUM + D3 PO) Take by mouth.      . cetirizine (ZYRTEC) 10 MG tablet Take 10 mg by mouth daily.      . Menthol, Topical Analgesic, (BIOFREEZE) 4 % GEL Apply 1 application topically as needed. For shoulder or neck pain      . Multiple Vitamin (MULTIVITAMIN) tablet Take 1 tablet by mouth daily.        . naproxen (NAPROSYN) 500 MG tablet Take 1 tablet (500 mg total) by mouth 2 (two) times daily. (every 12 hours with food)  20 tablet  1  . amoxicillin (AMOXIL) 875 MG tablet 1 tab po bid x 10d  20 tablet  0   No facility-administered medications  prior to visit.  **Not on amoxil as listed above  PE: Blood pressure 124/80, pulse 65, temperature 99.2 F (37.3 C), temperature source Temporal, resp. rate 16, height 5\' 5"  (1.651 m), weight 144 lb (65.318 kg), last menstrual period 01/24/2012, SpO2 98.00%. VS: noted--normal. Gen: alert, NAD, NONTOXIC APPEARING. HEENT: eyes without injection, drainage, or swelling.  Ears: EACs clear, TMs with normal light reflex and landmarks.  Nose: Clear rhinorrhea, with some dried, crusty exudate adherent to mildly injected mucosa.  No purulent d/c.  Bilat paranasal sinus TTP, L>R.  No facial swelling.  Throat and mouth without focal lesion.  No pharyngial swelling, erythema, or exudate.   Neck: supple, no LAD.   LUNGS: CTA bilat, nonlabored resps.   CV: RRR, no m/r/g. EXT: no c/c/e SKIN: no rash    IMPRESSION AND PLAN:  Sinusitis, acute Augmentin 875mg  bid x 10d. Mucinex DM or robit DM OTC daytime. Rx for hycodan for hs use. Saline nasal spray encouraged.  Rest, clear fluids encouraged.   An After Visit Summary was printed and given to the patient.  FOLLOW UP: prn

## 2013-11-30 NOTE — Patient Instructions (Signed)
OTC meds recommended: saline nasal spray, robitussin DM OR mucinex DM.

## 2013-11-30 NOTE — Progress Notes (Signed)
Pre visit review using our clinic review tool, if applicable. No additional management support is needed unless otherwise documented below in the visit note. 

## 2013-11-30 NOTE — Assessment & Plan Note (Signed)
Augmentin 875mg  bid x 10d. Mucinex DM or robit DM OTC daytime. Rx for hycodan for hs use. Saline nasal spray encouraged.  Rest, clear fluids encouraged.

## 2014-05-18 ENCOUNTER — Ambulatory Visit (INDEPENDENT_AMBULATORY_CARE_PROVIDER_SITE_OTHER): Payer: BC Managed Care – PPO | Admitting: Family Medicine

## 2014-05-18 ENCOUNTER — Encounter: Payer: Self-pay | Admitting: Family Medicine

## 2014-05-18 VITALS — BP 129/83 | HR 63 | Temp 98.4°F | Resp 16 | Ht 65.0 in | Wt 142.0 lb

## 2014-05-18 DIAGNOSIS — F410 Panic disorder [episodic paroxysmal anxiety] without agoraphobia: Secondary | ICD-10-CM

## 2014-05-18 DIAGNOSIS — F411 Generalized anxiety disorder: Secondary | ICD-10-CM

## 2014-05-18 MED ORDER — CITALOPRAM HYDROBROMIDE 20 MG PO TABS: 20.0000 mg | ORAL_TABLET | Freq: Every day | ORAL | Status: DC

## 2014-05-18 MED ORDER — CLONAZEPAM 0.5 MG PO TABS: ORAL_TABLET | ORAL | Status: DC

## 2014-05-18 NOTE — Progress Notes (Signed)
OFFICE NOTE  05/18/2014  CC:  Chief Complaint  Patient presents with  . place on neck    6 months  . Panic Attack    since pt started new job 1 year ago.     HPI: Patient is a 48 y.o. Caucasian female who is here for spot on neck and panic attack. 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  Recently things have gotten even worse: having panic attacks: describes acute onset of periods of nausea, diarrhea, palpitations/heart racing, some dizziness and HA, "a little bit of everything".  These episodes come nearly daily now, pt says she feels like she is going crazy.  Denies depressed mood.  Reports remote hx of same sx's, always in the context of a very stressful life event (2 prior periods were centered around her divorce and her sister's divorce).  Most recent was > 10 yrs ago, was on a med that helped but she cannot recall the name (was living in different state, no record).  Recalls that paxil was "too strong"--made her feel like a zombie/oversedated.  Benzo use in the past is limited to one dose of valium prior to getting a neck injection and she says this made her "drunk".   Pertinent PMH:  Past medical, surgical, social, and family history reviewed and no changes are noted since last office visit.  MEDS: Not taking hycodan or augmentin listed below Outpatient Prescriptions Prior to Visit  Medication Sig Dispense Refill  . albuterol (PROAIR HFA) 108 (90 BASE) MCG/ACT inhaler Inhale 2 puffs into the lungs every 6 (six) hours as needed for wheezing.  1 Inhaler  0  . Calcium Carb-Cholecalciferol (CALCIUM + D3 PO) Take by mouth.      . cetirizine (ZYRTEC) 10 MG tablet Take 10 mg by mouth daily.      . Menthol, Topical Analgesic, (BIOFREEZE) 4 % GEL Apply 1 application topically as needed. For shoulder or neck pain      . Multiple Vitamin (MULTIVITAMIN)  tablet Take 1 tablet by mouth daily.        . naproxen (NAPROSYN) 500 MG tablet Take 1 tablet (500 mg total) by mouth 2 (two) times daily. (every 12 hours with food)  20 tablet  1  . amoxicillin-clavulanate (AUGMENTIN) 875-125 MG per tablet Take 1 tablet by mouth 2 (two) times daily.  20 tablet  0  . HYDROcodone-homatropine (HYCODAN) 5-1.5 MG/5ML syrup 1-2 tsp po qhs prn cough  120 mL  0   No facility-administered medications prior to visit.    PE: Blood pressure 129/83, pulse 63, temperature 98.4 F (36.9 C), temperature source Temporal, resp. rate 16, height 5\' 5"  (1.651 m), weight 142 lb (64.411 kg), last menstrual period 01/24/2012, SpO2 100.00%. Wt Readings from Last 2 Encounters:  05/18/14 142 lb (64.411 kg)  11/30/13 144 lb (65.318 kg)    Gen: alert, oriented x 4, affect pleasant.  Lucid thinking and conversation noted. HEENT: PERRLA, EOMI.   Neck: no LAD, mass, or thyromegaly.  Small (bee bee sized) subQ firm papule without tenderness.  No overlying skin changes.  I feel no adenopathy. CV: RRR, no m/r/g LUNGS: CTA bilat, nonlabored. NEURO: no tremor or tics noted on observation.  Coordination intact. CN 2-12 grossly intact bilaterally, strength 5/5 in all extremeties.  No ataxia.  DTRs 2+ and symmetric in UE's and LE's.  LABS: none today  IMPRESSION AND  PLAN:  1) Anxiety with panic attacks: start citalopram 20mg  qd.  Therapeutic expectations and side effect profile of medication discussed today.  Patient's questions answered. Trial of clonazepam 0.5mg , 1/2-1 tab bid prn short term.  2) Small epidermal inclusion cyst on anterior neck--reassured pt.  Watchful waiting.  An After Visit Summary was printed and given to the patient.  FOLLOW UP: 1 mo

## 2014-05-18 NOTE — Progress Notes (Signed)
Pre visit review using our clinic review tool, if applicable. No additional management support is needed unless otherwise documented below in the visit note. 

## 2014-07-19 ENCOUNTER — Encounter: Payer: Self-pay | Admitting: Family Medicine

## 2014-07-19 ENCOUNTER — Ambulatory Visit (INDEPENDENT_AMBULATORY_CARE_PROVIDER_SITE_OTHER): Payer: BC Managed Care – PPO | Admitting: Family Medicine

## 2014-07-19 VITALS — BP 130/85 | HR 59 | Temp 99.0°F | Resp 16 | Ht 65.0 in | Wt 143.0 lb

## 2014-07-19 DIAGNOSIS — F410 Panic disorder [episodic paroxysmal anxiety] without agoraphobia: Secondary | ICD-10-CM

## 2014-07-19 DIAGNOSIS — F411 Generalized anxiety disorder: Secondary | ICD-10-CM

## 2014-07-19 MED ORDER — CITALOPRAM HYDROBROMIDE 20 MG PO TABS: 20.0000 mg | ORAL_TABLET | Freq: Every day | ORAL | Status: DC

## 2014-07-19 NOTE — Progress Notes (Signed)
OFFICE NOTE  07/19/2014  CC:  Chief Complaint  Patient presents with  . Follow-up     HPI: Patient is a 48 y.o. Caucasian female who is here for 2 mo f/u anxiety with panic attacks.   Started citalopram 20mg  qd and clonaz 0.5mg  1/2-1 q12h prn started at that last visit. Doing much better, much more calm and having much less frequent panic attacks and when she does have one it is much less severe.  Says cital makes her hungry all the time but she has not changed her eating habits.   Rarely takes a clonazepam. 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    Pertinent PMH:  Past medical, surgical, social, and family history reviewed and no changes are noted since last office visit.  MEDS:  Outpatient Prescriptions Prior to Visit  Medication Sig Dispense Refill  . albuterol (PROAIR HFA) 108 (90 BASE) MCG/ACT inhaler Inhale 2 puffs into the lungs every 6 (six) hours as needed for wheezing.  1 Inhaler  0  . Calcium Carb-Cholecalciferol (CALCIUM + D3 PO) Take by mouth.      . cetirizine (ZYRTEC) 10 MG tablet Take 10 mg by mouth daily.      . citalopram (CELEXA) 20 MG tablet Take 1 tablet (20 mg total) by mouth daily.  30 tablet  1  . clonazePAM (KLONOPIN) 0.5 MG tablet 1/2 -1 tab po bid prn anxiety  30 tablet  1  . Menthol, Topical Analgesic, (BIOFREEZE) 4 % GEL Apply 1 application topically as needed. For shoulder or neck pain      . Multiple Vitamin (MULTIVITAMIN) tablet Take 1 tablet by mouth daily.        . naproxen (NAPROSYN) 500 MG tablet Take 1 tablet (500 mg total) by mouth 2 (two) times daily. (every 12 hours with food)  20 tablet  1   No facility-administered medications prior to visit.    PE: Blood pressure 130/85, pulse 59, temperature 99 F (37.2 C), temperature source Temporal, resp. rate 16, height 5\' 5"  (1.651 m), weight 143 lb (64.864 kg), last menstrual period 01/24/2012, SpO2 99.00%. Gen: Alert, well  appearing.  Patient is oriented to person, place, time, and situation. AFFECT: pleasant, lucid thought and speech. CV: RRR, no m/r/g.   LUNGS: CTA bilat, nonlabored resps, good aeration in all lung fields. Neuro: CN 2-12 intact bilaterally, strength 5/5 in proximal and distal upper extremities and lower extremities bilaterally.  No sensory deficits.  No tremor.  No ataxia.    IMPRESSION AND PLAN:  GAD with panic attacks. MUCH improved.  The current medical regimen is effective;  continue present plan and medications.  An After Visit Summary was printed and given to the patient.  FOLLOW UP: 6 mo

## 2014-07-19 NOTE — Progress Notes (Signed)
Pre visit review using our clinic review tool, if applicable. No additional management support is needed unless otherwise documented below in the visit note. 

## 2014-10-05 ENCOUNTER — Encounter: Payer: Self-pay | Admitting: Nurse Practitioner

## 2014-10-05 ENCOUNTER — Encounter: Payer: Self-pay | Admitting: *Deleted

## 2014-10-05 ENCOUNTER — Ambulatory Visit (INDEPENDENT_AMBULATORY_CARE_PROVIDER_SITE_OTHER): Payer: BC Managed Care – PPO | Admitting: Nurse Practitioner

## 2014-10-05 VITALS — BP 112/70 | HR 70 | Temp 97.5°F | Ht 65.0 in | Wt 145.0 lb

## 2014-10-05 DIAGNOSIS — J069 Acute upper respiratory infection, unspecified: Secondary | ICD-10-CM | POA: Insufficient documentation

## 2014-10-05 DIAGNOSIS — H65192 Other acute nonsuppurative otitis media, left ear: Secondary | ICD-10-CM | POA: Insufficient documentation

## 2014-10-05 MED ORDER — AMOXICILLIN 500 MG PO CAPS: 500.0000 mg | ORAL_CAPSULE | Freq: Two times a day (BID) | ORAL | Status: DC

## 2014-10-05 NOTE — Progress Notes (Signed)
Pre visit review using our clinic review tool, if applicable. No additional management support is needed unless otherwise documented below in the visit note. 

## 2014-10-05 NOTE — Patient Instructions (Signed)
Treat symptoms as follows for at least 2 days before deciding to take antibiotic. Start antibiotic if ear still painful.  Start daily sinus wash (Neilmed sinus rinse). Use daily for 5 to 7 days.   Continue mucinex D.  Take ibuprophen to reduce sinus pressure & pain.  Follow up in 2 weeks.

## 2014-10-05 NOTE — Progress Notes (Signed)
   Subjective:    Patient ID: Sara Burton, female    DOB: 1966-06-30, 48 y.o.   MRN: [id number]  Sinusitis This is a new problem. The current episode started in the past 7 days (4d). The problem has been gradually worsening (sore throat better, nasal congestion & ear pain worse) since onset. The maximum temperature recorded prior to her arrival was 100 - 100.9 F. The fever has been present for 1 to 2 days. The pain is mild. Associated symptoms include congestion, coughing, ear pain, headaches, a hoarse voice and sinus pressure. Pertinent negatives include no chills, shortness of breath, sneezing, sore throat or swollen glands. Past treatments include oral decongestants. The treatment provided mild relief.      Review of Systems  Constitutional: Positive for fever. Negative for chills and fatigue.  HENT: Positive for congestion, ear pain, hoarse voice and sinus pressure. Negative for sneezing and sore throat.   Respiratory: Positive for cough. Negative for choking, chest tightness, shortness of breath and wheezing.   Cardiovascular: Negative for chest pain.  Neurological: Positive for headaches.       Objective:   Physical Exam  Vitals reviewed. Constitutional: She is oriented to person, place, and time. She appears well-developed and well-nourished. No distress.  HENT:  Head: Normocephalic and atraumatic.  Right Ear: External ear normal.  Mouth/Throat: No oropharyngeal exudate.  L TM vessels injected, bones visible, middle ear looks red. Cloudy fluid in R ear, bones visible. nasal quality to voice  Eyes: Conjunctivae are normal. Right eye exhibits no discharge. Left eye exhibits no discharge.  Neck: Normal range of motion. Neck supple.  Cardiovascular: Normal rate, regular rhythm and normal heart sounds.   No murmur heard. Pulmonary/Chest: Effort normal and breath sounds normal. No respiratory distress. She has no wheezes. She has no rales.  Frequent coughing during exam    Neurological: She is alert and oriented to person, place, and time.  Skin: Skin is warm and dry.  Psychiatric: She has a normal mood and affect. Her behavior is normal. Thought content normal.          Assessment & Plan:  1. Acute nonsuppurative otitis media of left ear - amoxicillin (AMOXIL) 500 MG capsule; Take 1 capsule (500 mg total) by mouth 2 (two) times daily.  Dispense: 20 capsule; Refill: 0  2. Acute upper respiratory infection See pt instructions. F/u 2 wks

## 2014-10-06 ENCOUNTER — Ambulatory Visit: Payer: BC Managed Care – PPO | Admitting: Family Medicine

## 2014-10-17 ENCOUNTER — Ambulatory Visit: Payer: BC Managed Care – PPO

## 2014-10-17 ENCOUNTER — Ambulatory Visit (INDEPENDENT_AMBULATORY_CARE_PROVIDER_SITE_OTHER): Payer: BC Managed Care – PPO

## 2014-10-17 DIAGNOSIS — Z23 Encounter for immunization: Secondary | ICD-10-CM

## 2014-11-18 ENCOUNTER — Encounter: Payer: Self-pay | Admitting: Family Medicine

## 2014-11-18 ENCOUNTER — Ambulatory Visit (INDEPENDENT_AMBULATORY_CARE_PROVIDER_SITE_OTHER): Payer: BC Managed Care – PPO | Admitting: Family Medicine

## 2014-11-18 VITALS — BP 146/89 | HR 57 | Temp 98.1°F | Resp 18 | Ht 66.0 in | Wt 151.0 lb

## 2014-11-18 DIAGNOSIS — J029 Acute pharyngitis, unspecified: Secondary | ICD-10-CM

## 2014-11-18 LAB — POCT RAPID STREP A (OFFICE): Rapid Strep A Screen: NEGATIVE

## 2014-11-18 MED ORDER — HYDROCODONE-ACETAMINOPHEN 5-325 MG PO TABS: 1.0000 | ORAL_TABLET | Freq: Four times a day (QID) | ORAL | Status: DC | PRN

## 2014-11-18 MED ORDER — AMOXICILLIN 875 MG PO TABS: 875.0000 mg | ORAL_TABLET | Freq: Two times a day (BID) | ORAL | Status: AC

## 2014-11-18 NOTE — Progress Notes (Signed)
OFFICE NOTE  11/18/2014  CC:  Chief Complaint  Patient presents with  . Sore Throat   HPI: Patient is a 48 y.o. Caucasian female who is here for 1-2d of ST, pressure in head, slight nasal mucous/congestion, achy yesterday all over.  Just HA today.  No cough, no fever.  Pertinent PMH:  Past medical, surgical, social, and family history reviewed and no changes are noted since last office visit.  MEDS: Not currently taking amoxil listed below Outpatient Prescriptions Prior to Visit  Medication Sig Dispense Refill  . albuterol (PROAIR HFA) 108 (90 BASE) MCG/ACT inhaler Inhale 2 puffs into the lungs every 6 (six) hours as needed for wheezing. 1 Inhaler 0  . Calcium Carb-Cholecalciferol (CALCIUM + D3 PO) Take by mouth.    . cetirizine (ZYRTEC) 10 MG tablet Take 10 mg by mouth daily.    . citalopram (CELEXA) 20 MG tablet Take 1 tablet (20 mg total) by mouth daily. 90 tablet 4  . clonazePAM (KLONOPIN) 0.5 MG tablet 1/2 -1 tab po bid prn anxiety 30 tablet 1  . Menthol, Topical Analgesic, (BIOFREEZE) 4 % GEL Apply 1 application topically as needed. For shoulder or neck pain    . Multiple Vitamin (MULTIVITAMIN) tablet Take 1 tablet by mouth daily.      Marland Kitchen amoxicillin (AMOXIL) 500 MG capsule Take 1 capsule (500 mg total) by mouth 2 (two) times daily. (Patient not taking: Reported on 11/18/2014) 20 capsule 0   No facility-administered medications prior to visit.    PE: Blood pressure 146/89, pulse 57, temperature 98.1 F (36.7 C), temperature source Oral, resp. rate 18, height 5\' 6"  (1.676 m), weight 151 lb (68.493 kg), last menstrual period 01/24/2012, SpO2 98 %. Gen: Alert, well appearing.  Patient is oriented to person, place, time, and situation. ENT: Ears: EACs clear, normal epithelium.  TMs with good light reflex and landmarks bilaterally.  Eyes: no injection, icteris, swelling, or exudate.  EOMI, PERRLA. Nose: no drainage or turbinate edema/swelling.  No injection or focal lesion. Face  and temples and forehead with mild discomfort to palpation diffusely, without swelling or erythema.   Mouth: lips without lesion/swelling.  Oral mucosa pink and moist.  Dentition intact and without obvious caries or gingival swelling.  Oropharynx without erythema, exudate, or swelling.  Neck - No masses or thyromegaly or limitation in range of motion CV: RRR, no m/r/g.   LUNGS: CTA bilat, nonlabored resps, good aeration in all lung fields. EXT: no clubbing, cyanosis, or edema.  Skin - no sores or suspicious lesions or rashes or color changes  LAB: rapid strep NEG  IMPRESSION AND PLAN:  Acute pharyngitis, likely viral BUT her husband had strep + pharyngitis about 3 wks ago. Today is Friday: I chose to send Group A strep throat clx and start amoxil.  If clx returns no growth will have pt d/c abx. Vicodin 5/325, 1-2 q6h prn pain, #30. Therapeutic expectations and side effect profile of medication discussed today.  Patient's questions answered.  An After Visit Summary was printed and given to the patient.  FOLLOW UP: prn

## 2014-11-18 NOTE — Progress Notes (Signed)
Pre visit review using our clinic review tool, if applicable. No additional management support is needed unless otherwise documented below in the visit note. 

## 2014-11-20 LAB — CULTURE, GROUP A STREP: Organism ID, Bacteria: NORMAL

## 2015-05-02 ENCOUNTER — Other Ambulatory Visit: Payer: Self-pay | Admitting: *Deleted

## 2015-05-02 MED ORDER — CLONAZEPAM 0.5 MG PO TABS: ORAL_TABLET | ORAL | Status: DC

## 2015-05-02 NOTE — Telephone Encounter (Signed)
Pts husband called requesting a refill for Clonazepam. LOV 11/18/14, no up coming apt, last written: #30 05/18/14 w/ 1RF.

## 2015-05-02 NOTE — Telephone Encounter (Signed)
Pt advised and voiced understanding.   

## 2015-05-02 NOTE — Telephone Encounter (Signed)
Rx printed but pt needs office visit for f/u anxiety before any rx RFs of this med after this one.-thx

## 2015-05-02 NOTE — Telephone Encounter (Signed)
Left message for pt to call back  °

## 2015-07-25 ENCOUNTER — Other Ambulatory Visit: Payer: Self-pay | Admitting: *Deleted

## 2015-07-25 MED ORDER — CITALOPRAM HYDROBROMIDE 20 MG PO TABS: 20.0000 mg | ORAL_TABLET | Freq: Every day | ORAL | Status: DC

## 2015-07-25 NOTE — Telephone Encounter (Signed)
RF request for citalopram LOV: 07/19/14 Next ov: None Last written: 07/19/14 #90 w/ 4RF Pt advised she will need to schedule ov with Dr. Anitra Lauth before any more refills will be sent in.

## 2015-08-05 ENCOUNTER — Encounter: Payer: Self-pay | Admitting: Emergency Medicine

## 2015-08-05 ENCOUNTER — Emergency Department
Admission: EM | Admit: 2015-08-05 | Discharge: 2015-08-05 | Disposition: A | Discharge status: Home / Self Care | Payer: 59 | Source: Home / Self Care | Attending: Family Medicine | Admitting: Family Medicine

## 2015-08-05 DIAGNOSIS — B9789 Other viral agents as the cause of diseases classified elsewhere: Secondary | ICD-10-CM

## 2015-08-05 DIAGNOSIS — J069 Acute upper respiratory infection, unspecified: Secondary | ICD-10-CM

## 2015-08-05 DIAGNOSIS — H6506 Acute serous otitis media, recurrent, bilateral: Secondary | ICD-10-CM | POA: Diagnosis not present

## 2015-08-05 MED ORDER — AMOXICILLIN 875 MG PO TABS: 875.0000 mg | ORAL_TABLET | Freq: Two times a day (BID) | ORAL | Status: DC

## 2015-08-05 MED ORDER — PREDNISONE 20 MG PO TABS: 20.0000 mg | ORAL_TABLET | Freq: Two times a day (BID) | ORAL | Status: DC

## 2015-08-05 MED ORDER — BENZONATATE 200 MG PO CAPS: 200.0000 mg | ORAL_CAPSULE | Freq: Every day | ORAL | Status: DC

## 2015-08-05 NOTE — Discharge Instructions (Signed)
Take plain guaifenesin (1200mg  extended release tabs such as Mucinex) twice daily, with plenty of water, for cough and congestion.  May add Pseudoephedrine (30mg , one or two every 4 to 6 hours) for sinus congestion.  Get adequate rest.   May use Afrin nasal spray (or generic oxymetazoline) twice daily for about 5 days and then discontinue.  Also recommend using saline nasal spray several times daily and saline nasal irrigation (AYR is a common brand).   Try warm salt water gargles for sore throat.  Stop all antihistamines for now, and other non-prescription cough/cold preparations. Follow-up with family doctor if not improving about10 days.

## 2015-08-05 NOTE — ED Notes (Signed)
Pt c/o head congestion, sinus pressure, runny nose, fever, non productive cough x 2 days.

## 2015-08-05 NOTE — ED Provider Notes (Signed)
CSN: [id number]     Arrival date & time 08/05/15  1614 History   First MD Initiated Contact with Patient 08/05/15 1620     Chief Complaint  Patient presents with  . Recurrent Sinusitis      HPI Comments: Patient complains of two day history of typical cold-like symptoms including mild sore throat, sinus congestion, headache, fatigue, and cough.  Yesterday she had fever to 101.5 degrees, and also developed left earache and facial pressure yesterday.  She has a history of recurrent otitis media.  She has a history of seasonal rhinitis, and mild asthma rarely needing to use an albuterol inhaler.  She has a family history of asthma.  The history is provided by the patient.    Past Medical History  Diagnosis Date  . Cervical spondylosis 2011    w/out myelopathy; nerve block injections done 08/2010 and 10/2010 (Dr. Letta Pate)  . Migraine     plus tension HA's; some occipital HA's associated with her cervicalgia  . Heart murmur     Pt reports this is present intermittently, says past ECHO normal.  . Fibroids, submucosal     u/s 07/2010  . Menorrhagia     Endo ablation 02/14/11  . PONV (postoperative nausea and vomiting)   . Bilateral external ear infections   . ALLERGIC RHINITIS 03/31/2010    Qualifier: Diagnosis of  By: Alveta Heimlich MD, Cornelia Copa    . Asthma   . Panic attacks    Past Surgical History  Procedure Laterality Date  . Cesarean section  02/2004  . Appendectomy  1979  . Finger surgery  1982    Ring finger right hand; no metal/screws in finger.  . Endometrial biopsy  12/2010    Benign, with exogenous hormone effect.  No hyperplasia.  . Endometrial ablation  02/14/11  . Robotic assisted laparoscopic vaginal hysterectomy with fibroid removal  02/17/12    Benign leiomyomata and benign cervix on path   Family History  Problem Relation Age of Onset  . Heart disease Father     CABG in his 3s  . Diabetes Father   . Asthma Brother   . Cancer Paternal Grandmother     Breast; detected in her  21's at the earliest   Social History  Substance Use Topics  . Smoking status: Never Smoker   . Smokeless tobacco: Never Used  . Alcohol Use: No   OB History    No data available     Review of Systems + sore throat + hoarse + cough No pleuritic pain No wheezing + nasal congestion + post-nasal drainage + sinus pain/pressure No itchy/red eyes + left earache No hemoptysis No SOB + fever, + chills No nausea + vomiting, resolved No abdominal pain No diarrhea No urinary symptoms No skin rash + fatigue No myalgias + headache Used OTC meds without relief  Allergies  Review of patient's allergies indicates no known allergies.  Home Medications   Prior to Admission medications   Medication Sig Start Date End Date Taking? Authorizing Provider  amoxicillin (AMOXIL) 875 MG tablet Take 1 tablet (875 mg total) by mouth 2 (two) times daily. 08/05/15   Kandra Nicolas, MD  benzonatate (TESSALON) 200 MG capsule Take 1 capsule (200 mg total) by mouth at bedtime. Take as needed for cough 08/05/15   Kandra Nicolas, MD  predniSONE (DELTASONE) 20 MG tablet Take 1 tablet (20 mg total) by mouth 2 (two) times daily. Take with food. 08/05/15   Kandra Nicolas, MD  Meds Ordered and Administered this Visit  Medications - No data to display  BP 131/81 mmHg  Pulse 83  Temp(Src) 100.4 F (38 C) (Oral)  Ht 5\' 5"  (1.651 m)  Wt 145 lb (65.772 kg)  BMI 24.13 kg/m2  SpO2 95%  LMP 01/24/2012 No data found.   Physical Exam Nursing notes and Vital Signs reviewed. Appearance:  Patient appears stated age, and in no acute distress Eyes:  Pupils are equal, round, and reactive to light and accomodation.  Extraocular movement is intact.  Conjunctivae are not inflamed  Ears:  Canals normal.   Tympanic membranes are mildly erythematous with serous effusions. Nose:  Congested turbinates.   Maxillary sinus tenderness is present.  Pharynx:  Normal Neck:  Supple.   Enlarged but minimally tender  posterior nodes are palpated bilaterally  Lungs:  Clear to auscultation.  Breath sounds are equal.  Moving air well. Heart:  Regular rate and rhythm without murmurs, rubs, or gallops.  Abdomen:  Nontender without masses or hepatosplenomegaly.  Bowel sounds are present.  No CVA or flank tenderness.  Extremities:  No edema.  No calf tenderness Skin:  No rash present.   ED Course  Procedures           MDM   1. Recurrent acute serous otitis media of both ears   2. Viral URI with cough     Begin prednisone burst, and amoxicillin 875mg  BID for 10 days.  Prescription written for Benzonatate [facility]) to take at bedtime for night-time cough.  Take plain guaifenesin (1200mg  extended release tabs such as Mucinex) twice daily, with plenty of water, for cough and congestion.  May add Pseudoephedrine (30mg , one or two every 4 to 6 hours) for sinus congestion.  Get adequate rest.   May use Afrin nasal spray (or generic oxymetazoline) twice daily for about 5 days and then discontinue.  Also recommend using saline nasal spray several times daily and saline nasal irrigation (AYR is a common brand).   Try warm salt water gargles for sore throat.  Stop all antihistamines for now, and other non-prescription cough/cold preparations. Follow-up with family doctor if not improving about10 days.     Kandra Nicolas, MD 08/05/15 [phone]

## 2015-08-17 ENCOUNTER — Ambulatory Visit (INDEPENDENT_AMBULATORY_CARE_PROVIDER_SITE_OTHER): Payer: 59 | Admitting: Family Medicine

## 2015-08-17 ENCOUNTER — Encounter: Payer: Self-pay | Admitting: Family Medicine

## 2015-08-17 VITALS — BP 124/77 | HR 76 | Temp 98.5°F | Resp 18 | Wt 154.4 lb

## 2015-08-17 DIAGNOSIS — J209 Acute bronchitis, unspecified: Secondary | ICD-10-CM | POA: Diagnosis not present

## 2015-08-17 MED ORDER — DOXYCYCLINE HYCLATE 100 MG PO TABS: 100.0000 mg | ORAL_TABLET | Freq: Two times a day (BID) | ORAL | Status: DC

## 2015-08-17 NOTE — Progress Notes (Signed)
Subjective:    Patient ID: Sara Burton, female    DOB: [birth year], 49 y.o.   MRN: [id number]  HPI  Congestion: Patient presents with a greater than 2 week history of congestion, chest tightness, cough, fever, runny nose, headache and facial pressure. Patient was seen in the urgent care on August 27, started on amoxicillin and prednisone, which she has completed both. She feels her symptoms never truly went away and now they are getting worse. She does have Tessalon Perles for cough, but she doesn't like these are working either. He did have a history of asthma, she has been using her inhaler approximately 2 times a week during her acute illness. Congestion/lung can breathe, chest feels tight. She is able to tolerate food and drink, she does endorse mild diarrhea after antibiotics use. Past Medical History  Diagnosis Date  . Cervical spondylosis 2011    w/out myelopathy; nerve block injections done 08/2010 and 10/2010 (Dr. Letta Pate)  . Migraine     plus tension HA's; some occipital HA's associated with her cervicalgia  . Heart murmur     Pt reports this is present intermittently, says past ECHO normal.  . Fibroids, submucosal     u/s 07/2010  . Menorrhagia     Endo ablation 02/14/11  . PONV (postoperative nausea and vomiting)   . Bilateral external ear infections   . ALLERGIC RHINITIS 03/31/2010    Qualifier: Diagnosis of  By: Alveta Heimlich MD, Cornelia Copa    . Asthma   . Panic attacks    No Known Allergies Social History   Social History  . Marital Status: Married    Spouse Name: N/A  . Number of Children: N/A  . Years of Education: N/A   Occupational History  . Not on file.   Social History Main Topics  . Smoking status: Never Smoker   . Smokeless tobacco: Never Used  . Alcohol Use: No  . Drug Use: No  . Sexual Activity: Yes   Other Topics Concern  . Not on file   Social History Narrative   Married, one son.   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   No T/A/Ds.    Has one brother and 1 sister.     Review of Systems Negative, with the exception of above mentioned in HPI     Objective:   Physical Exam BP 124/77 mmHg  Pulse 76  Temp(Src) 98.5 F (36.9 C) (Oral)  Resp 18  Wt 154 lb 6.4 oz (70.035 kg)  LMP 01/24/2012 Gen: Afebrile. No acute distress. Nontoxic appearance, well-developed, well-nourished Caucasian female. HENT: AT. [place]. Bilateral TM visualized shiny/4 in appearance, mild pink bilaterally.  MMM. Bilateral nares with severe erythema and swelling, excoriations present. Throat with mild erythema, no exudates. Mouth cough on exam. Hoarseness present. Facial tenderness present bilaterally maxillary sinuses. Eyes:Pupils Equal Round Reactive to light, Extraocular movements intact, Conjunctiva without redness, discharge or icterus. Neck: Supple, bilateral cervical anterior lymphadenopathy CV: RRR  Chest: No wheezing, no crackles, anterior rhonchi present. Good air movement. Abd: Soft. Flat. NTND. BS present. No Masses palpated.  Skin: No rashes, purpura or petechiae.     Assessment & Plan:  1. Acute bronchitis, unspecified organism - Continued maxillary sinus and bronchitis-like symptoms on exam today. - Antibiotics changed to doxycycline. Patient needs to hydrate, use Flonase daily, Mucinex as needed, Tylenol/Advil as needed. Nasal saline 3 times a day. - doxycycline (VIBRA-TABS) 100 MG tablet; Take 1 tablet (100 mg total) by mouth 2 (two) times daily.  Dispense: 20 tablet; Refill: 0 - Return in one week if symptoms do not improve.

## 2015-08-17 NOTE — Patient Instructions (Signed)
Sinusitis Sinusitis is redness, soreness, and inflammation of the paranasal sinuses. Paranasal sinuses are air pockets within the bones of your face (beneath the eyes, the middle of the forehead, or above the eyes). In healthy paranasal sinuses, mucus is able to drain out, and air is able to circulate through them by way of your nose. However, when your paranasal sinuses are inflamed, mucus and air can become trapped. This can allow bacteria and other germs to grow and cause infection. Sinusitis can develop quickly and last only a short time (acute) or continue over a long period (chronic). Sinusitis that lasts for more than 12 weeks is considered chronic.  CAUSES  Causes of sinusitis include:  Allergies.  Structural abnormalities, such as displacement of the cartilage that separates your nostrils (deviated septum), which can decrease the air flow through your nose and sinuses and affect sinus drainage.  Functional abnormalities, such as when the small hairs (cilia) that line your sinuses and help remove mucus do not work properly or are not present. SIGNS AND SYMPTOMS  Symptoms of acute and chronic sinusitis are the same. The primary symptoms are pain and pressure around the affected sinuses. Other symptoms include:  Upper toothache.  Earache.  Headache.  Bad breath.  Decreased sense of smell and taste.  A cough, which worsens when you are lying flat.  Fatigue.  Fever.  Thick drainage from your nose, which often is green and may contain pus (purulent).  Swelling and warmth over the affected sinuses. DIAGNOSIS  Your health care provider will perform a physical exam. During the exam, your health care provider may:  Look in your nose for signs of abnormal growths in your nostrils (nasal polyps).  Tap over the affected sinus to check for signs of infection.  View the inside of your sinuses (endoscopy) using an imaging device that has a light attached (endoscope). If your health  care provider suspects that you have chronic sinusitis, one or more of the following tests may be recommended:  Allergy tests.  Nasal culture. A sample of mucus is taken from your nose, sent to a lab, and screened for bacteria.  Nasal cytology. A sample of mucus is taken from your nose and examined by your health care provider to determine if your sinusitis is related to an allergy. TREATMENT  Most cases of acute sinusitis are related to a viral infection and will resolve on their own within 10 days. Sometimes medicines are prescribed to help relieve symptoms (pain medicine, decongestants, nasal steroid sprays, or saline sprays).  However, for sinusitis related to a bacterial infection, your health care provider will prescribe antibiotic medicines. These are medicines that will help kill the bacteria causing the infection.  Rarely, sinusitis is caused by a fungal infection. In theses cases, your health care provider will prescribe antifungal medicine. For some cases of chronic sinusitis, surgery is needed. Generally, these are cases in which sinusitis recurs more than 3 times per year, despite other treatments. HOME CARE INSTRUCTIONS   Drink plenty of water. Water helps thin the mucus so your sinuses can drain more easily.  Use a humidifier.  Inhale steam 3 to 4 times a day (for example, sit in the bathroom with the shower running).  Apply a warm, moist washcloth to your face 3 to 4 times a day, or as directed by your health care provider.  Use saline nasal sprays to help moisten and clean your sinuses.  Take medicines only as directed by your health care provider.    If you were prescribed either an antibiotic or antifungal medicine, finish it all even if you start to feel better. SEEK IMMEDIATE MEDICAL CARE IF:  You have increasing pain or severe headaches.  You have nausea, vomiting, or drowsiness.  You have swelling around your face.  You have vision problems.  You have a stiff  neck.  You have difficulty breathing. MAKE SURE YOU:   Understand these instructions.  Will watch your condition.  Will get help right away if you are not doing well or get worse. Document Released: 11/25/2005 Document Revised: 04/11/2014 Document Reviewed: 12/10/2011 [facility] Patient Information 2015 [place], [place]. This information is not intended to replace advice given to you by your health care provider. Make sure you discuss any questions you have with your health care provider.  Acute Bronchitis Bronchitis is inflammation of the airways that extend from the windpipe into the lungs (bronchi). The inflammation often causes mucus to develop. This leads to a cough, which is the most common symptom of bronchitis.  In acute bronchitis, the condition usually develops suddenly and goes away over time, usually in a couple weeks. Smoking, allergies, and asthma can make bronchitis worse. Repeated episodes of bronchitis may cause further lung problems.  CAUSES Acute bronchitis is most often caused by the same virus that causes a cold. The virus can spread from person to person (contagious) through coughing, sneezing, and touching contaminated objects. SIGNS AND SYMPTOMS   Cough.   Fever.   Coughing up mucus.   Body aches.   Chest congestion.   Chills.   Shortness of breath.   Sore throat.  DIAGNOSIS  Acute bronchitis is usually diagnosed through a physical exam. Your health care provider will also ask you questions about your medical history. Tests, such as chest X-rays, are sometimes done to rule out other conditions.  TREATMENT  Acute bronchitis usually goes away in a couple weeks. Oftentimes, no medical treatment is necessary. Medicines are sometimes given for relief of fever or cough. Antibiotic medicines are usually not needed but may be prescribed in certain situations. In some cases, an inhaler may be recommended to help reduce shortness of breath and control the  cough. A cool mist vaporizer may also be used to help thin bronchial secretions and make it easier to clear the chest.  HOME CARE INSTRUCTIONS  Get plenty of rest.   Drink enough fluids to keep your urine clear or pale yellow (unless you have a medical condition that requires fluid restriction). Increasing fluids may help thin your respiratory secretions (sputum) and reduce chest congestion, and it will prevent dehydration.   Take medicines only as directed by your health care provider.  If you were prescribed an antibiotic medicine, finish it all even if you start to feel better.  Avoid smoking and secondhand smoke. Exposure to cigarette smoke or irritating chemicals will make bronchitis worse. If you are a smoker, consider using nicotine gum or skin patches to help control withdrawal symptoms. Quitting smoking will help your lungs heal faster.   Reduce the chances of another bout of acute bronchitis by washing your hands frequently, avoiding people with cold symptoms, and trying not to touch your hands to your mouth, nose, or eyes.   Keep all follow-up visits as directed by your health care provider.  SEEK MEDICAL CARE IF: Your symptoms do not improve after 1 week of treatment.  SEEK IMMEDIATE MEDICAL CARE IF:  You develop an increased fever or chills.   You have chest pain.  You have severe shortness of breath.  You have bloody sputum.   You develop dehydration.  You faint or repeatedly feel like you are going to pass out.  You develop repeated vomiting.  You develop a severe headache. MAKE SURE YOU:   Understand these instructions.  Will watch your condition.  Will get help right away if you are not doing well or get worse. Document Released: 01/02/2005 Document Revised: 04/11/2014 Document Reviewed: 05/18/2013 [facility] Patient Information 2015 [place], [place]. This information is not intended to replace advice given to you by your health care provider. Make  sure you discuss any questions you have with your health care provider.  Continue hydration, flonase, mucinex, OTC cough syrup, new antibiotic for 10 days

## 2015-10-23 ENCOUNTER — Encounter: Payer: Self-pay | Admitting: Family Medicine

## 2015-10-23 ENCOUNTER — Ambulatory Visit (INDEPENDENT_AMBULATORY_CARE_PROVIDER_SITE_OTHER): Payer: 59 | Admitting: Family Medicine

## 2015-10-23 VITALS — BP 125/82 | HR 62 | Temp 98.1°F | Resp 16 | Ht 66.0 in | Wt 155.0 lb

## 2015-10-23 DIAGNOSIS — J0101 Acute recurrent maxillary sinusitis: Secondary | ICD-10-CM

## 2015-10-23 DIAGNOSIS — R69 Illness, unspecified: Principal | ICD-10-CM

## 2015-10-23 DIAGNOSIS — J111 Influenza due to unidentified influenza virus with other respiratory manifestations: Secondary | ICD-10-CM | POA: Diagnosis not present

## 2015-10-23 MED ORDER — CEFDINIR 300 MG PO CAPS: 300.0000 mg | ORAL_CAPSULE | Freq: Two times a day (BID) | ORAL | Status: DC

## 2015-10-23 MED ORDER — HYDROCODONE-HOMATROPINE 5-1.5 MG/5ML PO SYRP: 5.0000 mL | ORAL_SOLUTION | Freq: Three times a day (TID) | ORAL | Status: DC | PRN

## 2015-10-23 NOTE — Progress Notes (Signed)
Pre visit review using our clinic review tool, if applicable. No additional management support is needed unless otherwise documented below in the visit note. 

## 2015-10-23 NOTE — Progress Notes (Signed)
OFFICE NOTE  10/23/2015  CC:  Chief Complaint  Patient presents with  . URI    x 4 days   HPI: Patient is a 49 y.o. Caucasian female who is here for resp sx's. Onset ST 4d/a, then lots of nasal congestion, cough, ears hurting/itchy, T 103 2 days ago.  Whole body aches.  No rash. Nausea with occ vomiting of nasal drainage, + loose stools.  Chest feels heavy.  No wheezing. She has had her flu vaccine this year.  +Face pain and upper teeth pain.  Pertinent PMH:  Past medical, surgical, social, and family history reviewed and no changes are noted since last office visit.  MEDS:  Zyrtec 10mg  qd  PE: Blood pressure 125/82, pulse 62, temperature 98.1 F (36.7 C), temperature source Oral, resp. rate 16, height 5\' 6"  (1.676 m), weight 155 lb (70.308 kg), last menstrual period 01/24/2012, SpO2 97 %. VS: noted--normal. Gen: alert, NAD, NONTOXIC APPEARING. HEENT: eyes without injection, drainage, or swelling.  Ears: EACs clear, TMs with normal light reflex and landmarks.  Nose: Clear rhinorrhea, with some dried, crusty exudate adherent to mildly injected mucosa.  No purulent d/c.  Diffuse paranasal sinus TTP, esp maxillary areas.  No facial swelling.  Throat and mouth without focal lesion.  No pharyngial swelling, erythema, or exudate.   Neck: supple, no LAD.   LUNGS: CTA bilat, nonlabored resps.   CV: RRR, no m/r/g. EXT: no c/c/e SKIN: no rash  IMPRESSION AND PLAN: ILI, with acute sinusitis at this time. Cefdinir 300 mg bid x 10d. Hycodan syrup, 1-2 tsp q6h prn, #159ml. Push fluids, rest.  An After Visit Summary was printed and given to the patient.   FOLLOW UP: prn

## 2015-10-23 NOTE — Patient Instructions (Signed)
Trial of mucinex DM or robitussin DM otc as directed on the box. May use OTC nasal saline spray or irrigation solution bid.

## 2016-03-08 ENCOUNTER — Telehealth: Payer: Self-pay | Admitting: *Deleted

## 2016-03-08 ENCOUNTER — Telehealth: Payer: Self-pay

## 2016-03-08 NOTE — Telephone Encounter (Signed)
Three attempts have been made to contact pt in regards to flu shot (01/10/16, 01/16/16, 03/08/16). Pt has not returned any calls. Flu shot has been marked as declined.

## 2016-03-08 NOTE — Telephone Encounter (Signed)
Called patient to inquire about flu vaccine. Unable to reach.

## 2016-04-08 ENCOUNTER — Encounter: Payer: Self-pay | Admitting: Skilled Nursing Facility1

## 2016-04-08 ENCOUNTER — Encounter: Payer: 59 | Attending: Family Medicine | Admitting: Skilled Nursing Facility1

## 2016-04-08 VITALS — Ht 66.0 in

## 2016-04-08 DIAGNOSIS — E639 Nutritional deficiency, unspecified: Secondary | ICD-10-CM

## 2016-04-08 DIAGNOSIS — Z713 Dietary counseling and surveillance: Secondary | ICD-10-CM | POA: Insufficient documentation

## 2016-04-08 NOTE — Progress Notes (Signed)
Pt states she absolutely has no questions and no concerns, she only wants the live life well badge and does not wish to stay for the entire appointment.

## 2016-04-15 ENCOUNTER — Emergency Department
Admission: EM | Admit: 2016-04-15 | Discharge: 2016-04-15 | Disposition: A | Discharge status: Home / Self Care | Payer: 59 | Source: Home / Self Care | Attending: Family Medicine | Admitting: Family Medicine

## 2016-04-15 ENCOUNTER — Encounter: Payer: Self-pay | Admitting: *Deleted

## 2016-04-15 ENCOUNTER — Emergency Department (INDEPENDENT_AMBULATORY_CARE_PROVIDER_SITE_OTHER): Payer: 59

## 2016-04-15 DIAGNOSIS — S93402A Sprain of unspecified ligament of left ankle, initial encounter: Secondary | ICD-10-CM

## 2016-04-15 DIAGNOSIS — M7989 Other specified soft tissue disorders: Secondary | ICD-10-CM

## 2016-04-15 DIAGNOSIS — M79672 Pain in left foot: Secondary | ICD-10-CM | POA: Diagnosis not present

## 2016-04-15 DIAGNOSIS — M25572 Pain in left ankle and joints of left foot: Secondary | ICD-10-CM | POA: Diagnosis not present

## 2016-04-15 MED ORDER — IBUPROFEN 600 MG PO TABS: 600.0000 mg | ORAL_TABLET | Freq: Four times a day (QID) | ORAL | Status: DC | PRN

## 2016-04-15 MED ORDER — TRAMADOL HCL 50 MG PO TABS: 50.0000 mg | ORAL_TABLET | Freq: Four times a day (QID) | ORAL | Status: DC | PRN

## 2016-04-15 NOTE — Discharge Instructions (Signed)
Tramadol is strong pain medication. While taking, do not drink alcohol, drive, or perform any other activities that requires focus while taking these medications.   Acute Ankle Sprain With Phase I Rehab An acute ankle sprain is a partial or complete tear in one or more of the ligaments of the ankle due to traumatic injury. The severity of the injury depends on both the number of ligaments sprained and the grade of sprain. There are 3 grades of sprains.   A grade 1 sprain is a mild sprain. There is a slight pull without obvious tearing. There is no loss of strength, and the muscle and ligament are the correct length.  A grade 2 sprain is a moderate sprain. There is tearing of fibers within the substance of the ligament where it connects two bones or two cartilages. The length of the ligament is increased, and there is usually decreased strength.  A grade 3 sprain is a complete rupture of the ligament and is uncommon. In addition to the grade of sprain, there are three types of ankle sprains.  Lateral ankle sprains: This is a sprain of one or more of the three ligaments on the outer side (lateral) of the ankle. These are the most common sprains. Medial ankle sprains: There is one large triangular ligament of the inner side (medial) of the ankle that is susceptible to injury. Medial ankle sprains are less common. Syndesmosis, "high ankle," sprains: The syndesmosis is the ligament that connects the two bones of the lower leg. Syndesmosis sprains usually only occur with very severe ankle sprains. SYMPTOMS  Pain, tenderness, and swelling in the ankle, starting at the side of injury that may progress to the whole ankle and foot with time.  "Pop" or tearing sensation at the time of injury.  Bruising that may spread to the heel.  Impaired ability to walk soon after injury. CAUSES   Acute ankle sprains are caused by trauma placed on the ankle that temporarily forces or pries the anklebone (talus) out  of its normal socket.  Stretching or tearing of the ligaments that normally hold the joint in place (usually due to a twisting injury). RISK INCREASES WITH:  Previous ankle sprain.  Sports in which the foot may land awkwardly (i.e., basketball, volleyball, or soccer) or walking or running on uneven or rough surfaces.  Shoes with inadequate support to prevent sideways motion when stress occurs.  Poor strength and flexibility.  Poor balance skills.  Contact sports. PREVENTION   Warm up and stretch properly before activity.  Maintain physical fitness:  Ankle and leg flexibility, muscle strength, and endurance.  Cardiovascular fitness.  Balance training activities.  Use proper technique and have a coach correct improper technique.  Taping, protective strapping, bracing, or high-top tennis shoes may help prevent injury. Initially, tape is best; however, it loses most of its support function within 10 to 15 minutes.  Wear proper-fitted protective shoes (High-top shoes with taping or bracing is more effective than either alone).  Provide the ankle with support during sports and practice activities for 12 months following injury. PROGNOSIS   If treated properly, ankle sprains can be expected to recover completely; however, the length of recovery depends on the degree of injury.  A grade 1 sprain usually heals enough in 5 to 7 days to allow modified activity and requires an average of 6 weeks to heal completely.  A grade 2 sprain requires 6 to 10 weeks to heal completely.  A grade 3 sprain requires 12  to 16 weeks to heal.  A syndesmosis sprain often takes more than 3 months to heal. RELATED COMPLICATIONS   Frequent recurrence of symptoms may result in a chronic problem. Appropriately addressing the problem the first time decreases the frequency of recurrence and optimizes healing time. Severity of the initial sprain does not predict the likelihood of later  instability.  Injury to other structures (bone, cartilage, or tendon).  A chronically unstable or arthritic ankle joint is a possibility with repeated sprains. TREATMENT Treatment initially involves the use of ice, medication, and compression bandages to help reduce pain and inflammation. Ankle sprains are usually immobilized in a walking cast or boot to allow for healing. Crutches may be recommended to reduce pressure on the injury. After immobilization, strengthening and stretching exercises may be necessary to regain strength and a full range of motion. Surgery is rarely needed to treat ankle sprains. MEDICATION   Nonsteroidal anti-inflammatory medications, such as aspirin and ibuprofen (do not take for the first 3 days after injury or within 7 days before surgery), or other minor pain relievers, such as acetaminophen, are often recommended. Take these as directed by your caregiver. Contact your caregiver immediately if any bleeding, stomach upset, or signs of an allergic reaction occur from these medications.  Ointments applied to the skin may be helpful.  Pain relievers may be prescribed as necessary by your caregiver. Do not take prescription pain medication for longer than 4 to 7 days. Use only as directed and only as much as you need. HEAT AND COLD  Cold treatment (icing) is used to relieve pain and reduce inflammation for acute and chronic cases. Cold should be applied for 10 to 15 minutes every 2 to 3 hours for inflammation and pain and immediately after any activity that aggravates your symptoms. Use ice packs or an ice massage.  Heat treatment may be used before performing stretching and strengthening activities prescribed by your caregiver. Use a heat pack or a warm soak. SEEK IMMEDIATE MEDICAL CARE IF:   Pain, swelling, or bruising worsens despite treatment.  You experience pain, numbness, discoloration, or coldness in the foot or toes.  New, unexplained symptoms develop (drugs  used in treatment may produce side effects.) EXERCISES  PHASE I EXERCISES RANGE OF MOTION (ROM) AND STRETCHING EXERCISES - Ankle Sprain, Acute Phase I, Weeks 1 to 2 These exercises may help you when beginning to restore flexibility in your ankle. You will likely work on these exercises for the 1 to 2 weeks after your injury. Once your physician, physical therapist, or athletic trainer sees adequate progress, he or she will advance your exercises. While completing these exercises, remember:   Restoring tissue flexibility helps normal motion to return to the joints. This allows healthier, less painful movement and activity.  An effective stretch should be held for at least 30 seconds.  A stretch should never be painful. You should only feel a gentle lengthening or release in the stretched tissue. RANGE OF MOTION - Dorsi/Plantar Flexion  While sitting with your right / left knee straight, draw the top of your foot upwards by flexing your ankle. Then reverse the motion, pointing your toes downward.  Hold each position for __________ seconds.  After completing your first set of exercises, repeat this exercise with your knee bent. Repeat __________ times. Complete this exercise __________ times per day.  RANGE OF MOTION - Ankle Alphabet  Imagine your right / left big toe is a pen.  Keeping your hip and knee still, write  out the entire alphabet with your "pen." Make the letters as large as you can without increasing any discomfort. Repeat __________ times. Complete this exercise __________ times per day.  STRENGTHENING EXERCISES - Ankle Sprain, Acute -Phase I, Weeks 1 to 2 These exercises may help you when beginning to restore strength in your ankle. You will likely work on these exercises for 1 to 2 weeks after your injury. Once your physician, physical therapist, or athletic trainer sees adequate progress, he or she will advance your exercises. While completing these exercises, remember:    Muscles can gain both the endurance and the strength needed for everyday activities through controlled exercises.  Complete these exercises as instructed by your physician, physical therapist, or athletic trainer. Progress the resistance and repetitions only as guided.  You may experience muscle soreness or fatigue, but the pain or discomfort you are trying to eliminate should never worsen during these exercises. If this pain does worsen, stop and make certain you are following the directions exactly. If the pain is still present after adjustments, discontinue the exercise until you can discuss the trouble with your clinician. STRENGTH - Dorsiflexors  Secure a rubber exercise band/tubing to a fixed object (i.e., table, pole) and loop the other end around your right / left foot.  Sit on the floor facing the fixed object. The band/tubing should be slightly tense when your foot is relaxed.  Slowly draw your foot back toward you using your ankle and toes.  Hold this position for __________ seconds. Slowly release the tension in the band and return your foot to the starting position. Repeat __________ times. Complete this exercise __________ times per day.  STRENGTH - Plantar-flexors   Sit with your right / left leg extended. Holding onto both ends of a rubber exercise band/tubing, loop it around the ball of your foot. Keep a slight tension in the band.  Slowly push your toes away from you, pointing them downward.  Hold this position for __________ seconds. Return slowly, controlling the tension in the band/tubing. Repeat __________ times. Complete this exercise __________ times per day.  STRENGTH - Ankle Eversion  Secure one end of a rubber exercise band/tubing to a fixed object (table, pole). Loop the other end around your foot just before your toes.  Place your fists between your knees. This will focus your strengthening at your ankle.  Drawing the band/tubing across your opposite  foot, slowly, pull your little toe out and up. Make sure the band/tubing is positioned to resist the entire motion.  Hold this position for __________ seconds. Have your muscles resist the band/tubing as it slowly pulls your foot back to the starting position.  Repeat __________ times. Complete this exercise __________ times per day.  STRENGTH - Ankle Inversion  Secure one end of a rubber exercise band/tubing to a fixed object (table, pole). Loop the other end around your foot just before your toes.  Place your fists between your knees. This will focus your strengthening at your ankle.  Slowly, pull your big toe up and in, making sure the band/tubing is positioned to resist the entire motion.  Hold this position for __________ seconds.  Have your muscles resist the band/tubing as it slowly pulls your foot back to the starting position. Repeat __________ times. Complete this exercises __________ times per day.  STRENGTH - Towel Curls  Sit in a chair positioned on a non-carpeted surface.  Place your right / left foot on a towel, keeping your heel on the floor.  Pull the towel toward your heel by only curling your toes. Keep your heel on the floor.  If instructed by your physician, physical therapist, or athletic trainer, add weight to the end of the towel. Repeat __________ times. Complete this exercise __________ times per day.   This information is not intended to replace advice given to you by your health care provider. Make sure you discuss any questions you have with your health care provider.   Document Released: 06/26/2005 Document Revised: 12/16/2014 Document Reviewed: 03/09/2009 [employer] Interactive Patient Education [employer].

## 2016-04-15 NOTE — ED Notes (Signed)
Pt c/o LT ankle injury x 1700 while playing baseball. No OTC meds. She reports hearing a "pop".

## 2016-04-15 NOTE — ED Provider Notes (Signed)
CSN: [id number]     Arrival date & time 04/15/16  1831 History   First MD Initiated Contact with Patient 04/15/16 1841     Chief Complaint  Patient presents with  . Ankle Pain   (Consider location/radiation/quality/duration/timing/severity/associated sxs/prior Treatment) HPI The pt is a 50yo female presenting to [facility] with c/o sudden onset Left ankle pain after twisting it while playing baseball. She heard a "pop" in her ankle. Pain is aching and sore, 3/10, worse with ambulation. No medicaiton taken PTA.  No other injuries.   Past Medical History  Diagnosis Date  . Cervical spondylosis 2011    w/out myelopathy; nerve block injections done 08/2010 and 10/2010 (Dr. Letta Pate)  . Migraine     plus tension HA's; some occipital HA's associated with her cervicalgia  . Heart murmur     Pt reports this is present intermittently, says past ECHO normal.  . Fibroids, submucosal     u/s 07/2010  . Menorrhagia     Endo ablation 02/14/11  . PONV (postoperative nausea and vomiting)   . Bilateral external ear infections   . ALLERGIC RHINITIS 03/31/2010    Qualifier: Diagnosis of  By: Alveta Heimlich MD, Cornelia Copa    . Asthma   . Panic attacks    Past Surgical History  Procedure Laterality Date  . Cesarean section  02/2004  . Appendectomy  1979  . Finger surgery  1982    Ring finger right hand; no metal/screws in finger.  . Endometrial biopsy  12/2010    Benign, with exogenous hormone effect.  No hyperplasia.  . Endometrial ablation  02/14/11  . Robotic assisted laparoscopic vaginal hysterectomy with fibroid removal  02/17/12    Benign leiomyomata and benign cervix on path  . Abdominal hysterectomy     Family History  Problem Relation Age of Onset  . Heart disease Father     CABG in his 96s  . Diabetes Father   . Asthma Brother   . Cancer Paternal Grandmother     Breast; detected in her 28's at the earliest   Social History  Substance Use Topics  . Smoking status: Never Smoker   . Smokeless tobacco:  Never Used  . Alcohol Use: No   OB History    No data available     Review of Systems  Musculoskeletal: Positive for myalgias, joint swelling, arthralgias and gait problem.  Skin: Negative for color change and wound.  Neurological: Negative for weakness and numbness.    Allergies  Review of patient's allergies indicates no known allergies.  Home Medications   Prior to Admission medications   Medication Sig Start Date End Date Taking? Authorizing Provider  cetirizine (ZYRTEC) 10 MG tablet Take 10 mg by mouth daily.    Historical Provider, MD  ibuprofen (ADVIL,MOTRIN) 600 MG tablet Take 1 tablet (600 mg total) by mouth every 6 (six) hours as needed. 04/15/16   Noland Fordyce, PA-C  traMADol (ULTRAM) 50 MG tablet Take 1 tablet (50 mg total) by mouth every 6 (six) hours as needed for moderate pain or severe pain. 04/15/16   Noland Fordyce, PA-C   Meds Ordered and Administered this Visit  Medications - No data to display  BP 130/78 mmHg  Pulse 68  Temp(Src) 98.9 F (37.2 C) (Oral)  Resp 16  Ht 5' 5.5" (1.664 m)  Wt 145 lb (65.772 kg)  BMI 23.75 kg/m2  SpO2 98%  LMP 01/24/2012 No data found.   Physical Exam  Constitutional: She is oriented to person,  place, and time. She appears well-developed and well-nourished.  HENT:  Head: Normocephalic and atraumatic.  Eyes: EOM are normal.  Neck: Normal range of motion.  Cardiovascular: Normal rate.   Pulses:      Dorsalis pedis pulses are 2+ on the left side.  Pulmonary/Chest: Effort normal.  Musculoskeletal: Normal range of motion. She exhibits edema and tenderness.  Left ankle and foot: mild to moderate edema to lateral aspect of ankle and foot. Tenderness over lateral malleolus and proximal aspect of 5th metatarsal.  Full ROM ankle and toes.  Calf is soft, non-tender. Full ROM knee.   Neurological: She is alert and oriented to person, place, and time.  Left foot: normal sensation.   Skin: Skin is warm and dry.  Left ankle and  foot: skin in tact, no ecchymosis or erythema.   Psychiatric: She has a normal mood and affect. Her behavior is normal.  Nursing note and vitals reviewed.   ED Course  Procedures (including critical care time)  Labs Review Labs Reviewed - No data to display  Imaging Review Dg Ankle Complete Left  04/15/2016  CLINICAL DATA:  Left ankle injury playing baseball today. Left ankle pain and swelling. EXAM: LEFT ANKLE COMPLETE - 3+ VIEW COMPARISON:  None. FINDINGS: There is no evidence of fracture, dislocation, or joint effusion. There is no evidence of arthropathy or other focal bone abnormality. Soft tissues are unremarkable. IMPRESSION: Negative. Electronically Signed   By: Earle Gell M.D.   On: 04/15/2016 19:21   Dg Foot Complete Left  04/15/2016  CLINICAL DATA:  Left foot injury playing baseball today. Left foot pain and swelling. Initial encounter. EXAM: LEFT FOOT - COMPLETE 3+ VIEW COMPARISON:  None. FINDINGS: There is no evidence of fracture or dislocation. There is no evidence of arthropathy. Plantar calcaneal bone spur noted. No other bone abnormality identified. Soft tissues are unremarkable. IMPRESSION: No acute findings. Plantar calcaneal bone spur incidentally noted. Electronically Signed   By: Earle Gell M.D.   On: 04/15/2016 19:20      MDM   1. Left ankle sprain, initial encounter    Pt c/o Left ankle pain with swelling.  PMS in tact. Plain films: negative for fracture or dislocation. Will treat as sprain. Pt given ACE wrap. Pt has crutches at home. Home care instructions provided. Rx: tramadol and ibuprofen.  Expected full recovery in 4-6 weeks. Mild risk for prolonged impairment.  F/u with Sports Medicine in 1-2 weeks if symptoms worsening. Patient verbalized understanding and agreement with treatment plan.    Noland Fordyce, PA-C 04/16/16 1323

## 2016-08-06 ENCOUNTER — Other Ambulatory Visit: Payer: Self-pay | Admitting: Obstetrics and Gynecology

## 2016-08-06 DIAGNOSIS — Z1231 Encounter for screening mammogram for malignant neoplasm of breast: Secondary | ICD-10-CM

## 2016-08-09 ENCOUNTER — Ambulatory Visit
Admission: RE | Admit: 2016-08-09 | Discharge: 2016-08-09 | Disposition: A | Discharge status: Home / Self Care | Payer: 59 | Source: Ambulatory Visit | Attending: Obstetrics and Gynecology | Admitting: Obstetrics and Gynecology

## 2016-08-09 DIAGNOSIS — Z1231 Encounter for screening mammogram for malignant neoplasm of breast: Secondary | ICD-10-CM | POA: Diagnosis not present

## 2016-08-30 ENCOUNTER — Encounter: Payer: Self-pay | Admitting: Family Medicine

## 2016-08-30 ENCOUNTER — Ambulatory Visit (INDEPENDENT_AMBULATORY_CARE_PROVIDER_SITE_OTHER): Payer: 59 | Admitting: Family Medicine

## 2016-08-30 VITALS — BP 122/82 | HR 77 | Temp 98.0°F | Resp 16 | Wt 160.8 lb

## 2016-08-30 DIAGNOSIS — J018 Other acute sinusitis: Secondary | ICD-10-CM | POA: Diagnosis not present

## 2016-08-30 MED ORDER — AMOXICILLIN 875 MG PO TABS: 875.0000 mg | ORAL_TABLET | Freq: Two times a day (BID) | ORAL | 0 refills | Status: AC

## 2016-08-30 NOTE — Progress Notes (Signed)
OFFICE VISIT  08/30/2016   CC:  Chief Complaint  Patient presents with  . Sinusitis    Head congestion, ear pain   HPI:    Patient is a 50 y.o. Caucasian female who presents for respiratory symptoms. Onset about 24h ago, bad nasal congestion/runny nose, ST.  No cough.  Left ear hurts, both feel stopped up.  Says face hurts bad, has upper teeth pain as well.  HA in peri-orbital and frontal area.  No fever. No body aches.  No rash.  Dayquil/mucinex/nyquil all tried, says nothing helps.    Past Medical History:  Diagnosis Date  . ALLERGIC RHINITIS 03/31/2010   Qualifier: Diagnosis of  By: Alveta Heimlich MD, Cornelia Copa    . Asthma   . Bilateral external ear infections   . Cervical spondylosis 2011   w/out myelopathy; nerve block injections done 08/2010 and 10/2010 (Dr. Letta Pate)  . Fibroids, submucosal    u/s 07/2010  . Heart murmur    Pt reports this is present intermittently, says past ECHO normal.  . Menorrhagia    Endo ablation 02/14/11  . Migraine    plus tension HA's; some occipital HA's associated with her cervicalgia  . Panic attacks   . PONV (postoperative nausea and vomiting)     Past Surgical History:  Procedure Laterality Date  . ABDOMINAL HYSTERECTOMY    . APPENDECTOMY  1979  . CESAREAN SECTION  02/2004  . ENDOMETRIAL ABLATION  02/14/11  . ENDOMETRIAL BIOPSY  12/2010   Benign, with exogenous hormone effect.  No hyperplasia.  Marland Kitchen FINGER SURGERY  1982   Ring finger right hand; no metal/screws in finger.  . ROBOTIC ASSISTED LAPAROSCOPIC VAGINAL HYSTERECTOMY WITH FIBROID REMOVAL  02/17/12   Benign leiomyomata and benign cervix on path    Outpatient Medications Prior to Visit  Medication Sig Dispense Refill  . cetirizine (ZYRTEC) 10 MG tablet Take 10 mg by mouth daily.    Marland Kitchen ibuprofen (ADVIL,MOTRIN) 600 MG tablet Take 1 tablet (600 mg total) by mouth every 6 (six) hours as needed. 30 tablet 0  . traMADol (ULTRAM) 50 MG tablet Take 1 tablet (50 mg total) by mouth every 6 (six) hours  as needed for moderate pain or severe pain. 10 tablet 0   No facility-administered medications prior to visit.     No Known Allergies  ROS As per HPI  PE: Blood pressure 122/82, pulse 77, temperature 98 F (36.7 C), temperature source Oral, resp. rate 16, weight 160 lb 12.8 oz (72.9 kg), last menstrual period 01/24/2012, SpO2 98 %. VS: noted--normal. Gen: alert, NAD, NONTOXIC APPEARING. HEENT: eyes without injection, drainage, or swelling.  Ears: EACs clear, TMs with normal light reflex and landmarks.  Nose: Clear rhinorrhea, with some dried, crusty exudate adherent to mildly injected mucosa.  No purulent d/c.  Diffuse paranasal sinus TTP, R maxillary region worse than L.  No facial swelling.  Throat and mouth without focal lesion.  No pharyngial swelling, erythema, or exudate.   Neck: supple, no LAD.   LUNGS: CTA bilat, nonlabored resps.   CV: RRR, no m/r/g. EXT: no c/c/e SKIN: no rash  LABS:  none  IMPRESSION AND PLAN:  Acute sinusitis: dx based on the severity of her symptoms. Amoxil 875mg  bid x 10d. Try generic for zyrtec D or allegra D as directed on packaging.  Try flonase 2 sprays each nostril once daily.  Try otc generic saline nasal spray to irrigate and moisturize your nasal passages several times per day.  An After Visit  Summary was printed and given to the patient.  FOLLOW UP: Return if symptoms worsen or fail to improve.  Signed:  Crissie Sickles, MD           08/30/2016

## 2016-08-30 NOTE — Progress Notes (Signed)
Pre visit review using our clinic review tool, if applicable. No additional management support is needed unless otherwise documented below in the visit note. 

## 2016-08-30 NOTE — Patient Instructions (Signed)
Try generic for zyrtec D or allegra D as directed on packaging.  Try flonase 2 sprays each nostril once daily.  Try otc generic saline nasal spray to irrigate and moisturize your nasal passages several times per day.

## 2016-11-25 DIAGNOSIS — H524 Presbyopia: Secondary | ICD-10-CM | POA: Diagnosis not present

## 2016-12-09 DIAGNOSIS — R338 Other retention of urine: Secondary | ICD-10-CM

## 2016-12-09 HISTORY — DX: Other retention of urine: R33.8

## 2017-12-03 ENCOUNTER — Telehealth: Payer: Self-pay | Admitting: Family Medicine

## 2017-12-03 ENCOUNTER — Encounter (HOSPITAL_BASED_OUTPATIENT_CLINIC_OR_DEPARTMENT_OTHER): Payer: Self-pay | Admitting: Emergency Medicine

## 2017-12-03 ENCOUNTER — Other Ambulatory Visit: Payer: Self-pay

## 2017-12-03 ENCOUNTER — Emergency Department (HOSPITAL_BASED_OUTPATIENT_CLINIC_OR_DEPARTMENT_OTHER)
Admission: EM | Admit: 2017-12-03 | Discharge: 2017-12-03 | Disposition: A | Discharge status: Home / Self Care | Payer: 59 | Attending: Emergency Medicine | Admitting: Emergency Medicine

## 2017-12-03 ENCOUNTER — Ambulatory Visit (INDEPENDENT_AMBULATORY_CARE_PROVIDER_SITE_OTHER): Payer: 59 | Admitting: Family Medicine

## 2017-12-03 ENCOUNTER — Encounter: Payer: Self-pay | Admitting: Family Medicine

## 2017-12-03 VITALS — BP 129/84 | HR 79 | Temp 98.4°F | Resp 16 | Wt 165.0 lb

## 2017-12-03 DIAGNOSIS — Z79899 Other long term (current) drug therapy: Secondary | ICD-10-CM | POA: Diagnosis not present

## 2017-12-03 DIAGNOSIS — J45909 Unspecified asthma, uncomplicated: Secondary | ICD-10-CM | POA: Insufficient documentation

## 2017-12-03 DIAGNOSIS — R338 Other retention of urine: Secondary | ICD-10-CM

## 2017-12-03 DIAGNOSIS — R1030 Lower abdominal pain, unspecified: Secondary | ICD-10-CM | POA: Diagnosis not present

## 2017-12-03 DIAGNOSIS — R339 Retention of urine, unspecified: Secondary | ICD-10-CM | POA: Diagnosis not present

## 2017-12-03 LAB — URINALYSIS, ROUTINE W REFLEX MICROSCOPIC
Bilirubin Urine: NEGATIVE
Glucose, UA: NEGATIVE mg/dL
Ketones, ur: NEGATIVE mg/dL
Leukocytes, UA: NEGATIVE
Nitrite: NEGATIVE
Protein, ur: NEGATIVE mg/dL
Specific Gravity, Urine: 1.01 (ref 1.005–1.030)
pH: 6 (ref 5.0–8.0)

## 2017-12-03 LAB — URINALYSIS, MICROSCOPIC (REFLEX)

## 2017-12-03 NOTE — Progress Notes (Signed)
OFFICE VISIT  12/03/2017  CC:  Chief Complaint  Patient presents with  . Hospitalization Follow-up    from ED, urinary retention   HPI:    Patient is a 51 y.o.  female who presents accompanied by her mother for f/u ED visit from early this morning. Reviewed all records from the visit today. Presented to ED for lower abd pain.   Was found to have acute urinary retention.  She had 800 ml urine on bladder scan.  Foley insertion resulted in return of > 1L urine. Pt was d/c'd home with a foley cath in and was advised to f/u with a urologist. I cannot find any urine culture ordered.  HPI: about 18 hrs ago she started having lower abd and difficulty urinating.  She had previously been normal state of health--w/out any urinary (or other) sx's whatsoever.  No new meds started recently (takes zyrtec daily and excedrin prn.) Sx's never abated and she had nothing more than a trickle of urine coming, so she went to the ED. Felt significantly improved with bladder cath. Currently feels some soreness in body, some discomfort from urinary cath.  Has hx of post-op urinary retention after hysterectomy in 2013 that required foley cath briefly.  GYN did f/u for this, never saw a urologist.  No hx of recurrent UTI.  ROS: denies fever, flank pain, hematuria, difficulty having BM, LE weakness or paresthesias, no saddle anesthesia, arm weakness or paresthesias, no myalgias, arthralgias, or rash. 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  Pt reports remote hx of MRI brain for HAs and recalls results were normal.   Past Medical History:  Diagnosis Date  . ALLERGIC RHINITIS 03/31/2010   Qualifier: Diagnosis of  By: Alveta Heimlich MD, Cornelia Copa    . Asthma   . Bilateral external ear infections   . Cervical spondylosis 2011   w/out myelopathy; nerve block injections done 08/2010 and 10/2010 (Dr. Letta Pate)  . Fibroids, submucosal    u/s 07/2010  . Heart murmur    Pt  reports this is present intermittently, says past ECHO normal.  . Menorrhagia    Endo ablation 02/14/11  . Migraine    plus tension HA's; some occipital HA's associated with her cervicalgia  . Panic attacks   . PONV (postoperative nausea and vomiting)     Past Surgical History:  Procedure Laterality Date  . ABDOMINAL HYSTERECTOMY    . APPENDECTOMY  1979  . CESAREAN SECTION  02/2004  . ENDOMETRIAL ABLATION  02/14/11  . ENDOMETRIAL BIOPSY  12/2010   Benign, with exogenous hormone effect.  No hyperplasia.  Marland Kitchen FINGER SURGERY  1982   Ring finger right hand; no metal/screws in finger.  . ROBOTIC ASSISTED LAPAROSCOPIC VAGINAL HYSTERECTOMY WITH FIBROID REMOVAL  02/17/12   Benign leiomyomata and benign cervix on path    Outpatient Medications Prior to Visit  Medication Sig Dispense Refill  . cetirizine (ZYRTEC) 10 MG tablet Take 10 mg by mouth daily.    Marland Kitchen ibuprofen (ADVIL,MOTRIN) 600 MG tablet Take 1 tablet (600 mg total) by mouth every 6 (six) hours as needed. 30 tablet 0  . traMADol (ULTRAM) 50 MG tablet Take 1 tablet (50 mg total) by mouth every 6 (six) hours as needed for moderate pain or severe pain. (Patient not taking: Reported on 12/03/2017) 10 tablet 0   No facility-administered medications prior to visit.     No Known Allergies  ROS As per HPI  PE: Blood  pressure 129/84, pulse 79, temperature 98.4 F (36.9 C), temperature source Oral, resp. rate 16, weight 165 lb (74.8 kg), last menstrual period 01/24/2012, SpO2 95 %. Gen: Alert, well appearing.  Patient is oriented to person, place, time, and situation. AFFECT: pleasant, lucid thought and speech. CNG:FREV: no injection, icteris, swelling, or exudate.  EOMI, PERRLA. Mouth: lips without lesion/swelling.  Oral mucosa pink and moist. Oropharynx without erythema, exudate, or swelling.  CV: RRR, no m/r/g.   LUNGS: CTA bilat, nonlabored resps, good aeration in all lung fields. ABD: soft, NT/ND EXT: no clubbing, cyanosis, or edema.   Neuro: CN 2-12 intact bilaterally, strength 5/5 in proximal and distal upper extremities and lower extremities bilaterally.  No sensory deficits.  No tremor.  No disdiadochokinesis.  No ataxia.  Upper extremity and lower extremity DTRs symmetric.  No pronator drift.   LABS:    UA with micro today in the ED: NORMAL No urine culture ordered.  IMPRESSION AND PLAN:  Acute urinary retention of unknown etiology. No signif signs of neurologic or infectious cause.  Check CBC, CMET, ESR, CRP. [facility] recommended we have her leave her foley in for 7d prior to seeing them--they can consider voiding trial at that time but not earlier.  An After Visit Summary was printed and given to the patient.  FOLLOW UP: Return if symptoms worsen or fail to improve.  Signed:  Crissie Sickles, MD           12/03/2017

## 2017-12-03 NOTE — ED Provider Notes (Signed)
[facility] EMERGENCY DEPARTMENT Provider Note   CSN: [id number] Arrival date & time: 12/03/17  8546     History   Chief Complaint Chief Complaint  Patient presents with  . Abdominal Pain    HPI Sara Burton is a 51 y.o. female.  HPI  Patient comes in with chief complaint of lower quadrant abdominal pain. The patient has history of abdominal hysterectomy and appendectomy.  Patient reports that at 10 PM she started having sudden onset lower quadrant abdominal pain.  Patient also has been having urinary urgency without significant urine output.  Patient feels like she has to strain to get some urine out.  Patient denies any new medications.  Patient is taking ashwagnadha supplement for anxiety.  Patient is not on any psychiatric medication.  Patient denies any history of cancer, significant weight loss, night sweats.  Past Medical History:  Diagnosis Date  . ALLERGIC RHINITIS 03/31/2010   Qualifier: Diagnosis of  By: Alveta Heimlich MD, Cornelia Copa    . Asthma   . Bilateral external ear infections   . Cervical spondylosis 2011   w/out myelopathy; nerve block injections done 08/2010 and 10/2010 (Dr. Letta Pate)  . Fibroids, submucosal    u/s 07/2010  . Heart murmur    Pt reports this is present intermittently, says past ECHO normal.  . Menorrhagia    Endo ablation 02/14/11  . Migraine    plus tension HA's; some occipital HA's associated with her cervicalgia  . Panic attacks   . PONV (postoperative nausea and vomiting)     Patient Active Problem List   Diagnosis Date Noted  . Acute bronchitis 08/17/2015  . Acute nonsuppurative otitis media of left ear 10/05/2014  . Acute upper respiratory infection 10/05/2014  . GAD (generalized anxiety disorder) 05/18/2014  . Panic attacks 05/18/2014  . Health maintenance examination 05/10/2013    Past Surgical History:  Procedure Laterality Date  . ABDOMINAL HYSTERECTOMY    . APPENDECTOMY  1979  . CESAREAN SECTION  02/2004  .  ENDOMETRIAL ABLATION  02/14/11  . ENDOMETRIAL BIOPSY  12/2010   Benign, with exogenous hormone effect.  No hyperplasia.  Marland Kitchen FINGER SURGERY  1982   Ring finger right hand; no metal/screws in finger.  . ROBOTIC ASSISTED LAPAROSCOPIC VAGINAL HYSTERECTOMY WITH FIBROID REMOVAL  02/17/12   Benign leiomyomata and benign cervix on path    OB History    No data available       Home Medications    Prior to Admission medications   Medication Sig Start Date End Date Taking? Authorizing Provider  cetirizine (ZYRTEC) 10 MG tablet Take 10 mg by mouth daily.    [provider]  ibuprofen (ADVIL,MOTRIN) 600 MG tablet Take 1 tablet (600 mg total) by mouth every 6 (six) hours as needed. 04/15/16   Noe Gens, PA-C  traMADol (ULTRAM) 50 MG tablet Take 1 tablet (50 mg total) by mouth every 6 (six) hours as needed for moderate pain or severe pain. 04/15/16   Noe Gens, PA-C    Family History Family History  Problem Relation Age of Onset  . Heart disease Father        CABG in his 68s  . Diabetes Father   . Asthma Brother   . Cancer Paternal Grandmother        Breast; detected in her 42's at the earliest    Social History Social History   Tobacco Use  . Smoking status: Never Smoker  . Smokeless tobacco: Never Used  Substance Use Topics  . Alcohol use: No  . Drug use: No     Allergies   Patient has no known allergies.   Review of Systems Review of Systems  Constitutional: Positive for activity change.  Gastrointestinal: Positive for abdominal pain.  Genitourinary: Positive for decreased urine volume and difficulty urinating. Negative for dysuria, flank pain, frequency and hematuria.  Hematological: Does not bruise/bleed easily.     Physical Exam Updated Vital Signs BP (!) 172/97   Pulse 79   Temp 98.1 F (36.7 C) (Oral)   Resp 18   Ht 5\' 6"  (1.676 m)   Wt 68 kg (150 lb)   LMP 01/24/2012   SpO2 100%   BMI 24.21 kg/m   Physical Exam  Constitutional: She is  oriented to person, place, and time. She appears well-developed.  HENT:  Head: Normocephalic and atraumatic.  Eyes: EOM are normal.  Neck: Normal range of motion. Neck supple.  Cardiovascular: Normal rate.  Pulmonary/Chest: Effort normal.  Abdominal: Bowel sounds are normal. There is tenderness in the right lower quadrant, suprapubic area and left lower quadrant. There is guarding. There is no rebound.  Neurological: She is alert and oriented to person, place, and time.  Skin: Skin is warm and dry.  Nursing note and vitals reviewed.    ED Treatments / Results  Labs (all labs ordered are listed, but only abnormal results are displayed) Labs Reviewed  URINALYSIS, ROUTINE W REFLEX MICROSCOPIC - Abnormal; Notable for the following components:      Result Value   Hgb urine dipstick TRACE (*)    All other components within normal limits  URINALYSIS, MICROSCOPIC (REFLEX) - Abnormal; Notable for the following components:   Bacteria, UA FEW (*)    Squamous Epithelial / LPF 0-5 (*)    All other components within normal limits    EKG  EKG Interpretation None       Radiology No results found.  Procedures Procedures (including critical care time)  Medications Ordered in ED Medications - No data to display   Initial Impression / Assessment and Plan / ED Course  I have reviewed the triage vital signs and the nursing notes.  Pertinent labs & imaging results that were available during my care of the patient were reviewed by me and considered in my medical decision making (see chart for details).     Patient comes in with chief complaint of lower quadrant abdominal pain.  Based on history it appears that patient might be having urinary retention.  Bladder scan reveals more than 800 cc of urinary retention, Foley catheter placed and patient had more than a liter of urine output immediately.  No specific etiology is uncovered for the urinary retention on history.  Patient is not on  any psychiatry medications, antihistamines.  Also constitutional is are not positive for tumor.  Patient will be advised to follow-up with urologist for further workup.  Final Clinical Impressions(s) / ED Diagnoses   Final diagnoses:  Acute urinary retention    ED Discharge Orders    None       Varney Biles, MD 12/03/17 [phone]

## 2017-12-03 NOTE — Discharge Instructions (Signed)
It appears that the lower abdominal discomfort you are having was due to urinary retention. The urinary retention was relieved with a Foley catheter, and we are sending you home with it. Please follow-up with urologist for further diagnostic workup.

## 2017-12-03 NOTE — Telephone Encounter (Signed)
Spoke with patient she has foley cath in place. She denies any abdominal pain at this time and states her foley is draining well. Dr Anitra Lauth made aware patient will keep her appt.

## 2017-12-03 NOTE — ED Triage Notes (Signed)
Pt c/o lower abd pain with urinary frequency.

## 2017-12-03 NOTE — Telephone Encounter (Signed)
Pt was seen in ED early this morning and had significant urinary retention. Pls ask her if they left a foley cath in her bladder, or did they just do the in/out cath and d/c her w/out a foley. If no foley, is she still having significant trouble urinating?  Signif lower abd pain? If so, it would make more sense for [place] to try to get her in to urologist today (or at least try to) instead of coming here b/c she may need foley and we don't have that in our clinic. If she has foley, then f/u here this afternoon is fine. -thx

## 2017-12-03 NOTE — ED Notes (Signed)
Bladder scan showed <934mL; MD at bedside.

## 2017-12-04 LAB — COMPREHENSIVE METABOLIC PANEL
ALT: 13 U/L (ref 0–35)
AST: 17 U/L (ref 0–37)
Albumin: 4.4 g/dL (ref 3.5–5.2)
Alkaline Phosphatase: 61 U/L (ref 39–117)
BUN: 13 mg/dL (ref 6–23)
CO2: 27 mEq/L (ref 19–32)
Calcium: 9.6 mg/dL (ref 8.4–10.5)
Chloride: 105 mEq/L (ref 96–112)
Creatinine, Ser: 0.85 mg/dL (ref 0.40–1.20)
GFR: 75.15 mL/min (ref >60.00)
Glucose, Bld: 121 mg/dL — ABNORMAL HIGH (ref 70–99)
Potassium: 3.5 mEq/L (ref 3.5–5.1)
Sodium: 140 mEq/L (ref 135–145)
Total Bilirubin: 1.6 mg/dL — ABNORMAL HIGH (ref 0.2–1.2)
Total Protein: 6.8 g/dL (ref 6.0–8.3)

## 2017-12-04 LAB — CBC WITH DIFFERENTIAL/PLATELET
Basophils Absolute: 0.1 10*3/uL (ref 0.0–0.1)
Basophils Relative: 0.8 % (ref 0.0–3.0)
Eosinophils Absolute: 0.1 10*3/uL (ref 0.0–0.7)
Eosinophils Relative: 0.8 % (ref 0.0–5.0)
HCT: 44 % (ref 36.0–46.0)
Hemoglobin: 14.8 g/dL (ref 12.0–15.0)
Lymphocytes Relative: 19.5 % (ref 12.0–46.0)
Lymphs Abs: 2.1 10*3/uL (ref 0.7–4.0)
MCHC: 33.6 g/dL (ref 30.0–36.0)
MCV: 94.5 fl (ref 78.0–100.0)
Monocytes Absolute: 0.6 10*3/uL (ref 0.1–1.0)
Monocytes Relative: 5.5 % (ref 3.0–12.0)
Neutro Abs: 8 10*3/uL — ABNORMAL HIGH (ref 1.4–7.7)
Neutrophils Relative %: 73.4 % (ref 43.0–77.0)
Platelets: 266 10*3/uL (ref 150.0–400.0)
RBC: 4.65 Mil/uL (ref 3.87–5.11)
RDW: 12.9 % (ref 11.5–15.5)
WBC: 10.9 10*3/uL — ABNORMAL HIGH (ref 4.0–10.5)

## 2017-12-04 LAB — SEDIMENTATION RATE: Sed Rate: 2 mm/hr (ref 0–30)

## 2017-12-04 LAB — C-REACTIVE PROTEIN: CRP: 1 mg/dL (ref 0.5–20.0)

## 2017-12-05 ENCOUNTER — Encounter: Payer: Self-pay | Admitting: *Deleted

## 2017-12-10 MED FILL — TAMSULOSIN HCL 0.4 MG CAP: 0.4 | 30 days supply | Qty: 30 | Fill #0

## 2017-12-19 ENCOUNTER — Encounter: Payer: Self-pay | Admitting: Family Medicine

## 2017-12-23 ENCOUNTER — Encounter: Payer: Self-pay | Admitting: Family Medicine

## 2018-01-05 MED FILL — TAMSULOSIN HCL 0.4 MG CAP: 0.4 | 30 days supply | Qty: 30 | Fill #1

## 2018-01-07 ENCOUNTER — Ambulatory Visit (INDEPENDENT_AMBULATORY_CARE_PROVIDER_SITE_OTHER): Payer: No Typology Code available for payment source | Admitting: Family Medicine

## 2018-01-07 ENCOUNTER — Encounter: Payer: Self-pay | Admitting: Family Medicine

## 2018-01-07 VITALS — BP 120/72 | HR 74 | Temp 99.0°F | Resp 16 | Ht 66.0 in | Wt 166.5 lb

## 2018-01-07 DIAGNOSIS — R6889 Other general symptoms and signs: Secondary | ICD-10-CM | POA: Diagnosis not present

## 2018-01-07 DIAGNOSIS — J101 Influenza due to other identified influenza virus with other respiratory manifestations: Secondary | ICD-10-CM | POA: Diagnosis not present

## 2018-01-07 LAB — POC INFLUENZA A&B (BINAX/QUICKVUE)
Influenza A, POC: POSITIVE — AB
Influenza B, POC: NEGATIVE

## 2018-01-07 MED ORDER — OSELTAMIVIR PHOSPHATE 75 MG PO CAPS: 75.0000 mg | ORAL_CAPSULE | Freq: Two times a day (BID) | ORAL | 0 refills | Status: DC

## 2018-01-07 MED ORDER — HYDROCODONE-HOMATROPINE 5-1.5 MG/5ML PO SYRP: ORAL_SOLUTION | ORAL | 0 refills | Status: DC

## 2018-01-07 MED FILL — OSELTAMIVIR PHOSPHATE 75 MG: 75 | 5 days supply | Qty: 10 | Fill #0

## 2018-01-07 MED FILL — HYDROCODONE-HOMATROPINE SYR: 5-1.5 | 12 days supply | Qty: 120 | Fill #0

## 2018-01-07 NOTE — Progress Notes (Signed)
OFFICE VISIT  01/07/2018   CC:  Chief Complaint  Patient presents with  . Fever    102.72F, runny nose, body aches x 1 day   HPI:    Patient is a 52 y.o. Caucasian female who presents for "flu symptoms". Onset of nasal stuffiness, ST, HA, cough 3 d/a, then yesterday began to feel worse and run fever to 102.3 and get some diffuse back pain.  Very fatigued but no body aches other than her back. No n/v.  Some diarrhea 2 days ago: watery, frequent BMs---but these resolved after that day. No rash.  ST is gone now.  Cough still prominent. Nyquil taken last night and about 5 hours ago.   Past Medical History:  Diagnosis Date  . Acute urinary retention 2018   Once after hysterectomy surgery 2012, another spontaneous occurrence 11/2017--Dr. Jeffie Pollock started her on flomax 12/2017--much improved as of 12/22/2017 urol f/u.  Marland Kitchen ALLERGIC RHINITIS 03/31/2010   Qualifier: Diagnosis of  By: Alveta Heimlich MD, Cornelia Copa    . Asthma   . Bilateral external ear infections   . Cervical spondylosis 2011   w/out myelopathy; nerve block injections done 08/2010 and 10/2010 (Dr. Letta Pate)  . Fibroids, submucosal    u/s 07/2010  . Heart murmur    Pt reports this is present intermittently, says past ECHO normal.  . Menorrhagia    Endo ablation 02/14/11  . Migraine    plus tension HA's; some occipital HA's associated with her cervicalgia  . Panic attacks     Past Surgical History:  Procedure Laterality Date  . ABDOMINAL HYSTERECTOMY    . APPENDECTOMY  1979  . CESAREAN SECTION  02/2004  . ENDOMETRIAL ABLATION  02/14/11  . ENDOMETRIAL BIOPSY  12/2010   Benign, with exogenous hormone effect.  No hyperplasia.  Marland Kitchen FINGER SURGERY  1982   Ring finger right hand; no metal/screws in finger.  . ROBOTIC ASSISTED LAPAROSCOPIC VAGINAL HYSTERECTOMY WITH FIBROID REMOVAL  02/17/12   Benign leiomyomata and benign cervix on path    Outpatient Medications Prior to Visit  Medication Sig Dispense Refill  . cetirizine (ZYRTEC) 10 MG tablet  Take 10 mg by mouth daily.    Marland Kitchen ibuprofen (ADVIL,MOTRIN) 600 MG tablet Take 1 tablet (600 mg total) by mouth every 6 (six) hours as needed. 30 tablet 0  . tamsulosin (FLOMAX) 0.4 MG CAPS capsule Take 0.4 mg by mouth daily.  11  . traMADol (ULTRAM) 50 MG tablet Take 1 tablet (50 mg total) by mouth every 6 (six) hours as needed for moderate pain or severe pain. (Patient not taking: Reported on 12/03/2017) 10 tablet 0   No facility-administered medications prior to visit.     No Known Allergies  ROS As per HPI  PE: Blood pressure 120/72, pulse 74, temperature 99 F (37.2 C), temperature source Oral, resp. rate 16, height 5\' 6"  (1.676 m), weight 166 lb 8 oz (75.5 kg), last menstrual period 01/24/2012, SpO2 96 %. VS: noted--normal. Gen: alert, NAD, NONTOXIC APPEARING. HEENT: eyes without injection, drainage, or swelling.  Ears: EACs clear, TMs with normal light reflex and landmarks.  Nose: Clear rhinorrhea, with some dried, crusty exudate adherent to mildly injected mucosa.  No purulent d/c.  No paranasal sinus TTP.  No facial swelling.  Throat and mouth without focal lesion.  No pharyngial swelling, erythema, or exudate.   Neck: supple, no LAD.   LUNGS: CTA bilat, nonlabored resps.   CV: RRR, no m/r/g. EXT: no c/c/e SKIN: no rash  ROS: as  per HPI.  No neck stiffness.  No SOB, no wheezing, no chest tightness.  LABS:    Chemistry      Component Value Date/Time   NA 140 12/03/2017 1612   K 3.5 12/03/2017 1612   CL 105 12/03/2017 1612   CO2 27 12/03/2017 1612   BUN 13 12/03/2017 1612   CREATININE 0.85 12/03/2017 1612      Component Value Date/Time   CALCIUM 9.6 12/03/2017 1612   ALKPHOS 61 12/03/2017 1612   AST 17 12/03/2017 1612   ALT 13 12/03/2017 1612   BILITOT 1.6 (H) 12/03/2017 1612     Rapid Flu today showed POS influenza A.  IMPRESSION AND PLAN:  Influenza A: tamiflu 75mg  bid x 5d. Get otc generic robitussin DM OR Mucinex DM and use as directed on the packaging for  cough and congestion. Use otc generic saline nasal spray 2-3 times per day to irrigate/moisturize your nasal passages. Hycodan syrup 1-2 tsp qhs prn cough/aches, #120 ml. Instructions: No work or contact with infants or elderly people until 60 hours after your last fever (T > 100.4).  An After Visit Summary was printed and given to the patient.  FOLLOW UP: Return if symptoms worsen or fail to improve.  Signed:  Crissie Sickles, MD           01/07/2018

## 2018-01-07 NOTE — Patient Instructions (Signed)
Get otc generic robitussin DM OR Mucinex DM and use as directed on the packaging for cough and congestion. Use otc generic saline nasal spray 2-3 times per day to irrigate/moisturize your nasal passages.  No work or contact with infants or elderly people until 47 hours after your last fever (T > 100.4).

## 2018-02-04 MED FILL — TAMSULOSIN HCL 0.4 MG CAP: 0.4 | 30 days supply | Qty: 30 | Fill #2

## 2018-02-20 ENCOUNTER — Encounter: Payer: Self-pay | Admitting: Family Medicine

## 2018-02-20 ENCOUNTER — Ambulatory Visit (INDEPENDENT_AMBULATORY_CARE_PROVIDER_SITE_OTHER): Payer: No Typology Code available for payment source | Admitting: Family Medicine

## 2018-02-20 VITALS — BP 134/83 | HR 66 | Temp 98.0°F | Resp 20 | Ht 66.0 in | Wt 166.5 lb

## 2018-02-20 DIAGNOSIS — J0100 Acute maxillary sinusitis, unspecified: Secondary | ICD-10-CM | POA: Diagnosis not present

## 2018-02-20 MED ORDER — DOXYCYCLINE HYCLATE 100 MG PO TABS: 100.0000 mg | ORAL_TABLET | Freq: Two times a day (BID) | ORAL | 0 refills | Status: DC

## 2018-02-20 MED ORDER — METHYLPREDNISOLONE ACETATE 80 MG/ML IJ SUSP
80.0000 mg | Freq: Once | INTRAMUSCULAR | Status: AC
  Administered 2018-02-20: 80 mg via INTRAMUSCULAR

## 2018-02-20 NOTE — Progress Notes (Signed)
Sara Burton , 06/27/1966, 52 y.o., female MRN: [id number] Patient Care Team    Relationship Specialty Notifications Start End  McGowen, Adrian Blackwater, MD PCP - General Family Medicine  10/28/11   Christophe Louis, MD Consulting Physician Obstetrics and Gynecology  02/18/12   Irine Seal, MD Consulting Physician Urology  12/19/17     Chief Complaint  Patient presents with  . URI    congested x 6 weeks     Subjective: Pt presents for an OV with complaints of nasal congestion of 6 weeks duration.  Associated symptoms include facial pressure, cough, ears feeling full, headache. Her son was ill and she believes she was exposed to his virus. She initially started feeling better, but symptoms have hung on and recently started to become worse.   Pt has tried Flonase and nettie pot  to ease their symptoms.   Depression screen PHQ 2/9 04/08/2016  Decreased Interest 0  Down, Depressed, Hopeless 0  PHQ - 2 Score 0   No Known Allergies Social History   Tobacco Use  . Smoking status: Never Smoker  . Smokeless tobacco: Never Used  Substance Use Topics  . Alcohol use: No   Past Medical History:  Diagnosis Date  . Acute urinary retention 2018   Once after hysterectomy surgery 2012, another spontaneous occurrence 11/2017--Dr. Jeffie Pollock started her on flomax 12/2017--much improved as of 12/22/2017 urol f/u.  Marland Kitchen ALLERGIC RHINITIS 03/31/2010   Qualifier: Diagnosis of  By: Alveta Heimlich MD, Cornelia Copa    . Asthma   . Bilateral external ear infections   . Cervical spondylosis 2011   w/out myelopathy; nerve block injections done 08/2010 and 10/2010 (Dr. Letta Pate)  . Fibroids, submucosal    u/s 07/2010  . Heart murmur    Pt reports this is present intermittently, says past ECHO normal.  . Menorrhagia    Endo ablation 02/14/11  . Migraine    plus tension HA's; some occipital HA's associated with her cervicalgia  . Panic attacks    Past Surgical History:  Procedure Laterality Date  . ABDOMINAL HYSTERECTOMY    .  APPENDECTOMY  1979  . CESAREAN SECTION  02/2004  . ENDOMETRIAL ABLATION  02/14/11  . ENDOMETRIAL BIOPSY  12/2010   Benign, with exogenous hormone effect.  No hyperplasia.  Marland Kitchen FINGER SURGERY  1982   Ring finger right hand; no metal/screws in finger.  . ROBOTIC ASSISTED LAPAROSCOPIC VAGINAL HYSTERECTOMY WITH FIBROID REMOVAL  02/17/12   Benign leiomyomata and benign cervix on path   Family History  Problem Relation Age of Onset  . Heart disease Father        CABG in his 7s  . Diabetes Father   . Asthma Brother   . Cancer Paternal Grandmother        Breast; detected in her 70's at the earliest   Allergies as of 02/20/2018   No Known Allergies     Medication List        Accurate as of 02/20/18  2:29 PM. Always use your most recent med list.          cetirizine 10 MG tablet Commonly known as:  ZYRTEC Take 10 mg by mouth daily.   HYDROcodone-homatropine 5-1.5 MG/5ML syrup Commonly known as:  HYCODAN 1-2 tsp po qhs prn cough/body aches   ibuprofen 600 MG tablet Commonly known as:  ADVIL,MOTRIN Take 1 tablet (600 mg total) by mouth every 6 (six) hours as needed.   tamsulosin 0.4 MG Caps capsule Commonly known as:  FLOMAX Take 0.4 mg by mouth daily.       All past medical history, surgical history, allergies, family history, immunizations andmedications were updated in the EMR today and reviewed under the history and medication portions of their EMR.     ROS: Negative, with the exception of above mentioned in HPI   Objective:  BP 134/83 (BP Location: Left Arm, Patient Position: Sitting, Cuff Size: Normal)   Pulse 66   Temp 98 F (36.7 C)   Resp 20   Ht 5\' 6"  (1.676 m)   Wt 166 lb 8 oz (75.5 kg)   LMP 01/24/2012   SpO2 97%   BMI 26.87 kg/m  Body mass index is 26.87 kg/m. Gen: Afebrile. No acute distress. Nontoxic in appearance, well developed, well nourished.  HENT: AT. [place]. Bilateral TM visualized with fullness bilaterl, no erythema.  MMM, no oral lesions.  Bilateral nares with erythema, swelling and drainaeg. Throat without erythema or exudates. TTP sinus. Hoarseness present.  Eyes:Pupils Equal Round Reactive to light, Extraocular movements intact,  Conjunctiva without redness, discharge or icterus. Neck/lymp/endocrine: Supple,no lymphadenopathy CV: RRR  Chest: CTAB, no wheeze or crackles. Good air movement, normal resp effort.  Abd: Soft. NTND. BS present..  Neuro: Normal gait. PERLA. EOMi. Alert. Oriented x3  Psych: Normal affect, dress and demeanor. Normal speech. Normal thought content and judgment.  No exam data present No results found. No results found for this or any previous visit (from the past 24 hour(s)).  Assessment/Plan: Sara Burton is a 52 y.o. female present for OV for  Acute maxillary sinusitis, recurrence not specified Rest, hydrate.  +/- flonase, mucinex (DM if cough), nettie pot or nasal saline.  Doxycyline  prescribed, take until completed.  If cough present it can last up to 6-8 weeks.  F/U 2 weeks of not improved.    Reviewed expectations re: course of current medical issues.  Discussed self-management of symptoms.  Outlined signs and symptoms indicating need for more acute intervention.  Patient verbalized understanding and all questions were answered.  Patient received an After-Visit Summary.    No orders of the defined types were placed in this encounter.    Note is dictated utilizing voice recognition software. Although note has been proof read prior to signing, occasional typographical errors still can be missed. If any questions arise, please do not hesitate to call for verification.   electronically signed by:  Howard Pouch, DO  [place]

## 2018-02-20 NOTE — Patient Instructions (Signed)
Rest, hydrate.  +/- flonase, mucinex (DM if cough), nettie pot or nasal saline.  Doxycyline  prescribed, take until completed.  Steroid shot provided.  If cough present it can last up to 6-8 weeks.  F/U 2 weeks of not improved.    Sinusitis, Adult Sinusitis is soreness and inflammation of your sinuses. Sinuses are hollow spaces in the bones around your face. They are located:  Around your eyes.  In the middle of your forehead.  Behind your nose.  In your cheekbones.  Your sinuses and nasal passages are lined with a stringy fluid (mucus). Mucus normally drains out of your sinuses. When your nasal tissues get inflamed or swollen, the mucus can get trapped or blocked so air cannot flow through your sinuses. This lets bacteria, viruses, and funguses grow, and that leads to infection. Follow these instructions at home: Medicines  Take, use, or apply over-the-counter and prescription medicines only as told by your doctor. These may include nasal sprays.  If you were prescribed an antibiotic medicine, take it as told by your doctor. Do not stop taking the antibiotic even if you start to feel better. Hydrate and Humidify  Drink enough water to keep your pee (urine) clear or pale yellow.  Use a cool mist humidifier to keep the humidity level in your home above 50%.  Breathe in steam for 10-15 minutes, 3-4 times a day or as told by your doctor. You can do this in the bathroom while a hot shower is running.  Try not to spend time in cool or dry air. Rest  Rest as much as possible.  Sleep with your head raised (elevated).  Make sure to get enough sleep each night. General instructions  Put a warm, moist washcloth on your face 3-4 times a day or as told by your doctor. This will help with discomfort.  Wash your hands often with soap and water. If there is no soap and water, use hand sanitizer.  Do not smoke. Avoid being around people who are smoking (secondhand smoke).  Keep all  follow-up visits as told by your doctor. This is important. Contact a doctor if:  You have a fever.  Your symptoms get worse.  Your symptoms do not get better within 10 days. Get help right away if:  You have a very bad headache.  You cannot stop throwing up (vomiting).  You have pain or swelling around your face or eyes.  You have trouble seeing.  You feel confused.  Your neck is stiff.  You have trouble breathing. This information is not intended to replace advice given to you by your health care provider. Make sure you discuss any questions you have with your health care provider. Document Released: 05/13/2008 Document Revised: 07/21/2016 Document Reviewed: 09/20/2015 Elsevier Interactive Patient Education  [employer].

## 2018-03-09 MED FILL — TAMSULOSIN HCL 0.4 MG CAP: 0.4 | 30 days supply | Qty: 30 | Fill #3

## 2018-03-26 ENCOUNTER — Encounter: Payer: Self-pay | Admitting: Family Medicine

## 2018-03-26 ENCOUNTER — Ambulatory Visit (INDEPENDENT_AMBULATORY_CARE_PROVIDER_SITE_OTHER): Payer: No Typology Code available for payment source | Admitting: Family Medicine

## 2018-03-26 ENCOUNTER — Other Ambulatory Visit: Payer: Self-pay

## 2018-03-26 VITALS — BP 130/84 | HR 66 | Temp 98.3°F | Ht 66.0 in | Wt 167.4 lb

## 2018-03-26 DIAGNOSIS — M722 Plantar fascial fibromatosis: Secondary | ICD-10-CM | POA: Diagnosis not present

## 2018-03-26 MED ORDER — DICLOFENAC SODIUM 75 MG PO TBEC: 75.0000 mg | DELAYED_RELEASE_TABLET | Freq: Two times a day (BID) | ORAL | 0 refills | Status: DC

## 2018-03-26 NOTE — Progress Notes (Signed)
Subjective  CC:  Chief Complaint  Patient presents with  . Foot Pain    Patient states she is having pain in her right heel on both sides x a couple months     HPI: Sara Burton is a 52 y.o. female who presents to the office today to address the problems listed above in the chief complaint.  As above, right heel pain. Has had before - about a year ago related to running. Went away spontaneously that time, however now with progressive pain, worse upon standing in am and now interfering with walking. Hasn't tried any meds or therapies. Did have to stand on hard surface for her son's band concert last week - perhaps that is making it worse. No knee pain. No injuries   I reviewed the patients updated PMH, FH, and SocHx.    Patient Active Problem List   Diagnosis Date Noted  . GAD (generalized anxiety disorder) 05/18/2014  . Panic attacks 05/18/2014  . Health maintenance examination 05/10/2013   Current Meds  Medication Sig  . cetirizine (ZYRTEC) 10 MG tablet Take 10 mg by mouth daily.  . tamsulosin (FLOMAX) 0.4 MG CAPS capsule Take 0.4 mg by mouth daily.    Allergies: Patient has No Known Allergies. Family History: Patient family history includes Asthma in her brother; Cancer in her paternal grandmother; Diabetes in her father; Heart disease in her father. Social History:  Patient  reports that she has never smoked. She has never used smokeless tobacco. She reports that she does not drink alcohol or use drugs.  Review of Systems: Constitutional: Negative for fever malaise or anorexia Cardiovascular: negative for chest pain Respiratory: negative for SOB or persistent cough Gastrointestinal: negative for abdominal pain  Objective  Vitals: BP 130/84   Pulse 66   Temp 98.3 F (36.8 C)   Ht 5\' 6"  (1.676 m)   Wt 167 lb 6.4 oz (75.9 kg)   LMP 01/24/2012   BMI 27.02 kg/m  General: no acute distress , A&Ox3 Right foot: nl appearing, ttp over fascia insertion, no  achilles ttp, FROM at ankle  Assessment  1. Plantar fasciitis, right      Plan   PF right:  Education given. Ice, massage, heel cups, achilles stretches demonstrated and nsaids x 2 weeks. F/u with pcp if not improving. Avoid firm shoes or excessive walking. No running.   Follow up: Return if symptoms worsen or fail to improve.    Commons side effects, risks, benefits, and alternatives for medications and treatment plan prescribed today were discussed, and the patient expressed understanding of the given instructions. Patient is instructed to call or message via MyChart if he/she has any questions or concerns regarding our treatment plan. No barriers to understanding were identified. We discussed Red Flag symptoms and signs in detail. Patient expressed understanding regarding what to do in case of urgent or emergency type symptoms.   Medication list was reconciled, printed and provided to the patient in AVS. Patient instructions and summary information was reviewed with the patient as documented in the AVS. This note was prepared with assistance of Dragon voice recognition software. Occasional wrong-word or sound-a-like substitutions may have occurred due to the inherent limitations of voice recognition software  No orders of the defined types were placed in this encounter.  Meds ordered this encounter  Medications  . diclofenac (VOLTAREN) 75 MG EC tablet    Sig: Take 1 tablet (75 mg total) by mouth 2 (two) times daily.  Dispense:  30 tablet    Refill:  0

## 2018-03-26 NOTE — Patient Instructions (Signed)
Please follow up if symptoms do not improve or as needed.   Plantar Fasciitis Plantar fasciitis is a painful foot condition that affects the heel. It occurs when the band of tissue that connects the toes to the heel bone (plantar fascia) becomes irritated. This can happen after exercising too much or doing other repetitive activities (overuse injury). The pain from plantar fasciitis can range from mild irritation to severe pain that makes it difficult for you to walk or move. The pain is usually worse in the morning or after you have been sitting or lying down for a while. What are the causes? This condition may be caused by:  Standing for long periods of time.  Wearing shoes that do not fit.  Doing high-impact activities, including running, aerobics, and ballet.  Being overweight.  Having an abnormal way of walking (gait).  Having tight calf muscles.  Having high arches in your feet.  Starting a new athletic activity.  What are the signs or symptoms? The main symptom of this condition is heel pain. Other symptoms include:  Pain that gets worse after activity or exercise.  Pain that is worse in the morning or after resting.  Pain that goes away after you walk for a few minutes.  How is this diagnosed? This condition may be diagnosed based on your signs and symptoms. Your health care provider will also do a physical exam to check for:  A tender area on the bottom of your foot.  A high arch in your foot.  Pain when you move your foot.  Difficulty moving your foot.  You may also need to have imaging studies to confirm the diagnosis. These can include:  X-rays.  Ultrasound.  MRI.  How is this treated? Treatment for plantar fasciitis depends on the severity of the condition. Your treatment may include:  Rest, ice, and over-the-counter pain medicines to manage your pain.  Exercises to stretch your calves and your plantar fascia.  A splint that holds your foot in a  stretched, upward position while you sleep (night splint).  Physical therapy to relieve symptoms and prevent problems in the future.  Cortisone injections to relieve severe pain.  Extracorporeal shock wave therapy (ESWT) to stimulate damaged plantar fascia with electrical impulses. It is often used as a last resort before surgery.  Surgery, if other treatments have not worked after 12 months.  Follow these instructions at home:  Take medicines only as directed by your health care provider.  Avoid activities that cause pain.  Roll the bottom of your foot over a bag of ice or a bottle of cold water. Do this for 20 minutes, 3-4 times a day.  Perform simple stretches as directed by your health care provider.  Try wearing athletic shoes with air-sole or gel-sole cushions or soft shoe inserts.  Wear a night splint while sleeping, if directed by your health care provider.  Keep all follow-up appointments with your health care provider. How is this prevented?  Do not perform exercises or activities that cause heel pain.  Consider finding low-impact activities if you continue to have problems.  Lose weight if you need to. The best way to prevent plantar fasciitis is to avoid the activities that aggravate your plantar fascia. Contact a health care provider if:  Your symptoms do not go away after treatment with home care measures.  Your pain gets worse.  Your pain affects your ability to move or do your daily activities. This information is not intended to  replace advice given to you by your health care provider. Make sure you discuss any questions you have with your health care provider. Document Released: 08/20/2001 Document Revised: 04/29/2016 Document Reviewed: 10/05/2014 Elsevier Interactive Patient Education  [employer].

## 2018-03-30 ENCOUNTER — Encounter: Payer: Self-pay | Admitting: Family Medicine

## 2018-04-06 MED FILL — TAMSULOSIN HCL 0.4 MG CAP: 0.4 | 30 days supply | Qty: 30 | Fill #4

## 2018-04-09 ENCOUNTER — Encounter: Payer: Self-pay | Admitting: Family Medicine

## 2018-04-23 ENCOUNTER — Ambulatory Visit (INDEPENDENT_AMBULATORY_CARE_PROVIDER_SITE_OTHER): Payer: No Typology Code available for payment source | Admitting: Family Medicine

## 2018-04-23 ENCOUNTER — Encounter: Payer: Self-pay | Admitting: Family Medicine

## 2018-04-23 VITALS — BP 103/69 | HR 69 | Temp 98.2°F | Resp 20 | Ht 66.0 in | Wt 167.0 lb

## 2018-04-23 DIAGNOSIS — J0100 Acute maxillary sinusitis, unspecified: Secondary | ICD-10-CM

## 2018-04-23 MED ORDER — BENZONATATE 200 MG PO CAPS: 200.0000 mg | ORAL_CAPSULE | Freq: Two times a day (BID) | ORAL | 0 refills | Status: DC | PRN

## 2018-04-23 MED ORDER — DOXYCYCLINE HYCLATE 100 MG PO TABS: 100.0000 mg | ORAL_TABLET | Freq: Two times a day (BID) | ORAL | 0 refills | Status: DC

## 2018-04-23 NOTE — Progress Notes (Signed)
Sara Burton , [birth year], 52 y.o., female MRN: [id number] Patient Care Team    Relationship Specialty Notifications Start End  McGowen, Adrian Blackwater, MD PCP - General Family Medicine  10/28/11   Christophe Louis, MD Consulting Physician Obstetrics and Gynecology  02/18/12   Irine Seal, MD Consulting Physician Urology  12/19/17     Chief Complaint  Patient presents with  . URI    sinus pressure,fever, chills x 2 days     Subjective: Pt presents for an OV with complaints of sinus pressure of 3 days duration.  Associated symptoms include nasal drainage, fullness of left ear, sore throat, mild nausea and chills/fever. She denies vomit or diarrhea. Her son was seen yesterday with similar symptoms.  Pt has tried many OTC sinus meds to ease their symptoms.   Depression screen PHQ 2/9 04/08/2016  Decreased Interest 0  Down, Depressed, Hopeless 0  PHQ - 2 Score 0    No Known Allergies Social History   Tobacco Use  . Smoking status: Never Smoker  . Smokeless tobacco: Never Used  Substance Use Topics  . Alcohol use: No   Past Medical History:  Diagnosis Date  . Acute urinary retention 2018   Once after hysterectomy surgery 2012, another spontaneous occurrence 11/2017--Dr. Jeffie Pollock started her on flomax 12/2017--much improved as of 12/22/2017 and 03/2018 urol f/u.  Marland Kitchen ALLERGIC RHINITIS 03/31/2010   Qualifier: Diagnosis of  By: Alveta Heimlich MD, Cornelia Copa    . Asthma   . Bilateral external ear infections   . Cervical spondylosis 2011   w/out myelopathy; nerve block injections done 08/2010 and 10/2010 (Dr. Letta Pate)  . Fibroids, submucosal    u/s 07/2010  . Heart murmur    Pt reports this is present intermittently, says past ECHO normal.  . Menorrhagia    Endo ablation 02/14/11  . Migraine    plus tension HA's; some occipital HA's associated with her cervicalgia  . Panic attacks   . Plantar fasciitis    Past Surgical History:  Procedure Laterality Date  . ABDOMINAL HYSTERECTOMY    . APPENDECTOMY   1979  . CESAREAN SECTION  02/2004  . ENDOMETRIAL ABLATION  02/14/11  . ENDOMETRIAL BIOPSY  12/2010   Benign, with exogenous hormone effect.  No hyperplasia.  Marland Kitchen FINGER SURGERY  1982   Ring finger right hand; no metal/screws in finger.  . ROBOTIC ASSISTED LAPAROSCOPIC VAGINAL HYSTERECTOMY WITH FIBROID REMOVAL  02/17/12   Benign leiomyomata and benign cervix on path   Family History  Problem Relation Age of Onset  . Heart disease Father        CABG in his 57s  . Diabetes Father   . Asthma Brother   . Cancer Paternal Grandmother        Breast; detected in her 35's at the earliest   Allergies as of 04/23/2018   No Known Allergies     Medication List        Accurate as of 04/23/18 11:00 AM. Always use your most recent med list.          cetirizine 10 MG tablet Commonly known as:  ZYRTEC Take 10 mg by mouth daily.   ibuprofen 600 MG tablet Commonly known as:  ADVIL,MOTRIN Take 1 tablet (600 mg total) by mouth every 6 (six) hours as needed.   tamsulosin 0.4 MG Caps capsule Commonly known as:  FLOMAX Take 0.4 mg by mouth daily.       All past medical history, surgical history, allergies, family  history, immunizations andmedications were updated in the EMR today and reviewed under the history and medication portions of their EMR.     ROS: Negative, with the exception of above mentioned in HPI   Objective:  BP 103/69 (BP Location: Left Arm, Patient Position: Sitting, Cuff Size: Large)   Pulse 69   Temp 98.2 F (36.8 C)   Resp 20   Ht 5\' 6"  (1.676 m)   Wt 167 lb (75.8 kg)   LMP 01/24/2012   SpO2 97%   BMI 26.95 kg/m  Body mass index is 26.95 kg/m. Gen: Afebrile. No acute distress. Nontoxic in appearance, well developed, well nourished.  HENT: AT. [place]. Bilateral TM visualized with fullness no erythema. MMM, no oral lesions. Bilateral nares with drainage, mild erythema. Throat without erythema or exudates. PND present. Mild cough. TTP left max sinus Eyes:Pupils Equal  Round Reactive to light, Extraocular movements intact,  Conjunctiva without redness, discharge or icterus. Neck/lymp/endocrine: Supple,no lymphadenopathy CV: RRR  Chest: CTAB, no wheeze or crackles. Good air movement, normal resp effort.  Abd: Soft. NTND. BS present. no Masses palpated. No rebound or guarding.  Skin: no rashes, purpura or petechiae.  Neuro:  Normal gait. PERLA. EOMi. Alert. Oriented x3   No exam data present No results found. No results found for this or any previous visit (from the past 24 hour(s)).  Assessment/Plan: HEYDY MONTILLA is a 52 y.o. female present for OV for  Acute non-recurrent maxillary sinusitis Rest, hydrate.  +/- flonase, mucinex (DM if cough), nettie pot or nasal saline.  Doxycycline every 12 hours prescribed, take until completed.  Tessalon perles prescribed If cough present it can last up to 6-8 weeks.  F/U 2 weeks of not improved.    Reviewed expectations re: course of current medical issues.  Discussed self-management of symptoms.  Outlined signs and symptoms indicating need for more acute intervention.  Patient verbalized understanding and all questions were answered.  Patient received an After-Visit Summary.    No orders of the defined types were placed in this encounter.    Note is dictated utilizing voice recognition software. Although note has been proof read prior to signing, occasional typographical errors still can be missed. If any questions arise, please do not hesitate to call for verification.   electronically signed by:  Howard Pouch, DO  [place]

## 2018-04-23 NOTE — Patient Instructions (Addendum)
Rest, hydrate.  + flonase, mucinex (DM if cough), nettie pot or nasal saline.  Doxycycline every 12 hours prescribed, take until completed.  Tessalon perles prescribed If cough present it can last up to 6-8 weeks.  F/U 2 weeks of not improved.    Sinusitis, Adult Sinusitis is soreness and inflammation of your sinuses. Sinuses are hollow spaces in the bones around your face. They are located:  Around your eyes.  In the middle of your forehead.  Behind your nose.  In your cheekbones.  Your sinuses and nasal passages are lined with a stringy fluid (mucus). Mucus normally drains out of your sinuses. When your nasal tissues get inflamed or swollen, the mucus can get trapped or blocked so air cannot flow through your sinuses. This lets bacteria, viruses, and funguses grow, and that leads to infection. Follow these instructions at home: Medicines  Take, use, or apply over-the-counter and prescription medicines only as told by your doctor. These may include nasal sprays.  If you were prescribed an antibiotic medicine, take it as told by your doctor. Do not stop taking the antibiotic even if you start to feel better. Hydrate and Humidify  Drink enough water to keep your pee (urine) clear or pale yellow.  Use a cool mist humidifier to keep the humidity level in your home above 50%.  Breathe in steam for 10-15 minutes, 3-4 times a day or as told by your doctor. You can do this in the bathroom while a hot shower is running.  Try not to spend time in cool or dry air. Rest  Rest as much as possible.  Sleep with your head raised (elevated).  Make sure to get enough sleep each night. General instructions  Put a warm, moist washcloth on your face 3-4 times a day or as told by your doctor. This will help with discomfort.  Wash your hands often with soap and water. If there is no soap and water, use hand sanitizer.  Do not smoke. Avoid being around people who are smoking (secondhand  smoke).  Keep all follow-up visits as told by your doctor. This is important. Contact a doctor if:  You have a fever.  Your symptoms get worse.  Your symptoms do not get better within 10 days. Get help right away if:  You have a very bad headache.  You cannot stop throwing up (vomiting).  You have pain or swelling around your face or eyes.  You have trouble seeing.  You feel confused.  Your neck is stiff.  You have trouble breathing. This information is not intended to replace advice given to you by your health care provider. Make sure you discuss any questions you have with your health care provider. Document Released: 05/13/2008 Document Revised: 07/21/2016 Document Reviewed: 09/20/2015 Elsevier Interactive Patient Education  [employer].

## 2018-05-07 MED FILL — TAMSULOSIN HCL 0.4 MG CAP: 0.4 | 30 days supply | Qty: 30 | Fill #5

## 2018-05-28 ENCOUNTER — Encounter: Payer: Self-pay | Admitting: Family Medicine

## 2018-05-28 ENCOUNTER — Ambulatory Visit (INDEPENDENT_AMBULATORY_CARE_PROVIDER_SITE_OTHER): Payer: No Typology Code available for payment source | Admitting: Family Medicine

## 2018-05-28 VITALS — BP 130/73 | HR 61 | Temp 98.7°F | Resp 16 | Ht 65.0 in | Wt 164.2 lb

## 2018-05-28 DIAGNOSIS — E663 Overweight: Secondary | ICD-10-CM

## 2018-05-28 DIAGNOSIS — Z0000 Encounter for general adult medical examination without abnormal findings: Secondary | ICD-10-CM

## 2018-05-28 DIAGNOSIS — Z23 Encounter for immunization: Secondary | ICD-10-CM

## 2018-05-28 NOTE — Patient Instructions (Signed)

## 2018-05-28 NOTE — Addendum Note (Signed)
Addended by: Gordy Councilman on: 05/28/2018 03:25 PM   Modules accepted: Orders

## 2018-05-28 NOTE — Progress Notes (Signed)
Office Note 05/28/2018  CC:  Chief Complaint  Patient presents with  . Annual Exam    Pt is not fasting.    HPI:  Sara Burton is a 52 y.o. White female who is here for annual health maintenance exam. GYN MD is Dr. Christophe Louis.  Exercise: 2 days per week CV exercise at work, daily walking at work. Diet: cutting down on bread. Dental: preventatives UTD. Eyes: q2 yrs.  Has been monitoring bp at home: 120s/70s, with occ reading 150/90.  Has been stressed a lot lately.   Past Medical History:  Diagnosis Date  . Acute urinary retention 2018   Once after hysterectomy surgery 2012, another spontaneous occurrence 11/2017--Dr. Jeffie Pollock started her on flomax 12/2017--much improved as of 12/22/2017 and 03/2018 urol f/u.  Marland Kitchen ALLERGIC RHINITIS 03/31/2010   Qualifier: Diagnosis of  By: Alveta Heimlich MD, Cornelia Copa    . Asthma   . Bilateral external ear infections   . Cervical spondylosis 2011   w/out myelopathy; nerve block injections done 08/2010 and 10/2010 (Dr. Letta Pate)  . Fibroids, submucosal    u/s 07/2010  . Heart murmur    Pt reports this is present intermittently, says past ECHO normal.  . Menorrhagia    Endo ablation 02/14/11  . Migraine    plus tension HA's; some occipital HA's associated with her cervicalgia  . Panic attacks   . Plantar fasciitis   . Shingles    x 2.  Once L rib cage, once L ear.    Past Surgical History:  Procedure Laterality Date  . ABDOMINAL HYSTERECTOMY    . APPENDECTOMY  1979  . CESAREAN SECTION  02/2004  . ENDOMETRIAL ABLATION  02/14/11  . ENDOMETRIAL BIOPSY  12/2010   Benign, with exogenous hormone effect.  No hyperplasia.  Marland Kitchen FINGER SURGERY  1982   Ring finger right hand; no metal/screws in finger.  . ROBOTIC ASSISTED LAPAROSCOPIC VAGINAL HYSTERECTOMY WITH FIBROID REMOVAL  02/17/12   Benign leiomyomata and benign cervix on path    Family History  Problem Relation Age of Onset  . Heart disease Father        CABG in his 23s  . Diabetes Father   .  Asthma Brother   . Cancer Paternal Grandmother        Breast; detected in her 50's at the earliest    Social History   Socioeconomic History  . Marital status: Married    Spouse name: Not on file  . Number of children: Not on file  . Years of education: Not on file  . Highest education level: Not on file  Occupational History  . Not on file  Social Needs  . Financial resource strain: Not on file  . Food insecurity:    Worry: Not on file    Inability: Not on file  . Transportation needs:    Medical: Not on file    Non-medical: Not on file  Tobacco Use  . Smoking status: Never Smoker  . Smokeless tobacco: Never Used  Substance and Sexual Activity  . Alcohol use: No  . Drug use: No  . Sexual activity: Yes  Lifestyle  . Physical activity:    Days per week: Not on file    Minutes per session: Not on file  . Stress: Not on file  Relationships  . Social connections:    Talks on phone: Not on file    Gets together: Not on file    Attends religious service: Not on file  Active member of club or organization: Not on file    Attends meetings of clubs or organizations: Not on file    Relationship status: Not on file  . Intimate partner violence:    Fear of current or ex partner: Not on file    Emotionally abused: Not on file    Physically abused: Not on file    Forced sexual activity: Not on file  Other Topics Concern  . Not on file  Social History Narrative   Married, one son.   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   No T/A/Ds.   Has one brother and 1 sister.    Outpatient Medications Prior to Visit  Medication Sig Dispense Refill  . cetirizine (ZYRTEC) 10 MG tablet Take 10 mg by mouth daily.    Marland Kitchen ibuprofen (ADVIL,MOTRIN) 600 MG tablet Take 1 tablet (600 mg total) by mouth every 6 (six) hours as needed. 30 tablet 0  . tamsulosin (FLOMAX) 0.4 MG CAPS capsule Take 0.4 mg by mouth daily.  11  . benzonatate (TESSALON) 200 MG capsule Take 1 capsule (200 mg  total) by mouth 2 (two) times daily as needed for cough. (Patient not taking: Reported on 05/28/2018) 20 capsule 0  . doxycycline (VIBRA-TABS) 100 MG tablet Take 1 tablet (100 mg total) by mouth 2 (two) times daily. (Patient not taking: Reported on 05/28/2018) 20 tablet 0   No facility-administered medications prior to visit.     No Known Allergies  ROS Review of Systems  Constitutional: Negative for appetite change, chills, fatigue and fever.  HENT: Negative for congestion, dental problem, ear pain and sore throat.   Eyes: Negative for discharge, redness and visual disturbance.  Respiratory: Negative for cough, chest tightness, shortness of breath and wheezing.   Cardiovascular: Negative for chest pain, palpitations and leg swelling.  Gastrointestinal: Negative for abdominal pain, blood in stool, diarrhea, nausea and vomiting.  Genitourinary: Negative for difficulty urinating, dysuria, flank pain, frequency, hematuria and urgency.  Musculoskeletal: Negative for arthralgias, back pain, joint swelling, myalgias and neck stiffness.  Skin: Negative for pallor and rash.  Neurological: Negative for dizziness, speech difficulty, weakness and headaches.  Hematological: Negative for adenopathy. Does not bruise/bleed easily.  Psychiatric/Behavioral: Negative for confusion and sleep disturbance. The patient is not nervous/anxious.     PE; Blood pressure 130/73, pulse 61, temperature 98.7 F (37.1 C), temperature source Oral, resp. rate 16, height 5\' 5"  (1.651 m), weight 164 lb 4 oz (74.5 kg), last menstrual period 01/24/2012, SpO2 96 %. Body mass index is 27.33 kg/m. Pt examined with Helayne Seminole, CMA, as chaperone.  Gen: Alert, well appearing.  Patient is oriented to person, place, time, and situation. AFFECT: pleasant, lucid thought and speech. ENT: Ears: EACs clear, normal epithelium.  TMs with good light reflex and landmarks bilaterally.  Eyes: no injection, icteris, swelling, or  exudate.  EOMI, PERRLA. Nose: no drainage or turbinate edema/swelling.  No injection or focal lesion.  Mouth: lips without lesion/swelling.  Oral mucosa pink and moist.  Dentition intact and without obvious caries or gingival swelling.  Oropharynx without erythema, exudate, or swelling.  Neck: supple/nontender.  No LAD, mass, or TM.  Carotid pulses 2+ bilaterally, without bruits. CV: RRR, no m/r/g.   LUNGS: CTA bilat, nonlabored resps, good aeration in all lung fields. ABD: soft, NT, ND, BS normal.  No hepatospenomegaly or mass.  No bruits. EXT: no clubbing, cyanosis, or edema.  Musculoskeletal: no joint swelling, erythema, warmth, or tenderness.  ROM of all joints  intact. Skin - no sores or suspicious lesions or rashes or color changes   Pertinent labs:  Lab Results  Component Value Date   TSH 1.69 05/05/2013   Lab Results  Component Value Date   WBC 10.9 (H) 12/03/2017   HGB 14.8 12/03/2017   HCT 44.0 12/03/2017   MCV 94.5 12/03/2017   PLT 266.0 12/03/2017   Lab Results  Component Value Date   CREATININE 0.85 12/03/2017   BUN 13 12/03/2017   NA 140 12/03/2017   K 3.5 12/03/2017   CL 105 12/03/2017   CO2 27 12/03/2017   Lab Results  Component Value Date   ALT 13 12/03/2017   AST 17 12/03/2017   ALKPHOS 61 12/03/2017   BILITOT 1.6 (H) 12/03/2017   Lab Results  Component Value Date   CHOL 164 05/05/2013   Lab Results  Component Value Date   HDL 43.40 05/05/2013   Lab Results  Component Value Date   LDLCALC 105 (H) 05/05/2013   Lab Results  Component Value Date   TRIG 79.0 05/05/2013   Lab Results  Component Value Date   CHOLHDL 4 05/05/2013    ASSESSMENT AND PLAN:   Health maintenance exam: Reviewed age and gender appropriate health maintenance issues (prudent diet, regular exercise, health risks of tobacco and excessive alcohol, use of seatbelts, fire alarms in home, use of sunscreen).  Also reviewed age and gender appropriate health screening as well  as vaccine recommendations. Vaccines: UTD.  Shingrix discussed-->#1 given today. Labs: fasting HP labs ordered--> future. Cervical ca screening: has had hysterectomy, path benign.  Followed by GYN. Breast ca screening: last mammogram in EMR was 08/2016.  She plans on getting this with her GYN MD soon. Colon ca screening: time for initial screening colonoscopy-->she wants to wait until next year's cpe for ANY type of colon ca screening.  An After Visit Summary was printed and given to the patient.  FOLLOW UP:  Return in about 1 year (around 05/29/2019) for annual CPE (fasting).  Signed:  Crissie Sickles, MD           05/28/2018

## 2018-06-03 ENCOUNTER — Other Ambulatory Visit (INDEPENDENT_AMBULATORY_CARE_PROVIDER_SITE_OTHER): Payer: No Typology Code available for payment source

## 2018-06-03 DIAGNOSIS — Z0000 Encounter for general adult medical examination without abnormal findings: Secondary | ICD-10-CM

## 2018-06-03 NOTE — Addendum Note (Signed)
Addended by: Ralph Dowdy on: 06/03/2018 08:21 AM   Modules accepted: Orders

## 2018-06-03 NOTE — Addendum Note (Signed)
Addended by: Ralph Dowdy on: 06/03/2018 08:24 AM   Modules accepted: Orders

## 2018-06-03 NOTE — Addendum Note (Signed)
Addended by: Ralph Dowdy on: 06/03/2018 08:22 AM   Modules accepted: Orders

## 2018-06-04 LAB — CBC WITH DIFFERENTIAL/PLATELET
Basophils Absolute: 0 10*3/uL (ref 0.0–0.2)
Basos: 1 %
EOS (ABSOLUTE): 0.3 10*3/uL (ref 0.0–0.4)
Eos: 7 %
Hematocrit: 41.8 % (ref 34.0–46.6)
Hemoglobin: 14.5 g/dL (ref 11.1–15.9)
Immature Grans (Abs): 0 10*3/uL (ref 0.0–0.1)
Immature Granulocytes: 0 %
Lymphocytes Absolute: 1.9 10*3/uL (ref 0.7–3.1)
Lymphs: 42 %
MCH: 31.2 pg (ref 26.6–33.0)
MCHC: 34.7 g/dL (ref 31.5–35.7)
MCV: 90 fL (ref 79–97)
Monocytes Absolute: 0.3 10*3/uL (ref 0.1–0.9)
Monocytes: 7 %
Neutrophils Absolute: 1.9 10*3/uL (ref 1.4–7.0)
Neutrophils: 43 %
Platelets: 225 10*3/uL (ref 150–450)
RBC: 4.65 x10E6/uL (ref 3.77–5.28)
RDW: 12 % — ABNORMAL LOW (ref 12.3–15.4)
WBC: 4.4 10*3/uL (ref 3.4–10.8)

## 2018-06-04 LAB — LIPID PANEL
Chol/HDL Ratio: 3.9 ratio (ref 0.0–4.4)
Cholesterol, Total: 167 mg/dL (ref 100–199)
HDL: 43 mg/dL (ref >39)
LDL Calculated: 97 mg/dL (ref 0–99)
Triglycerides: 136 mg/dL (ref 0–149)
VLDL Cholesterol Cal: 27 mg/dL (ref 5–40)

## 2018-06-04 LAB — COMPREHENSIVE METABOLIC PANEL
ALT: 13 IU/L (ref 0–32)
AST: 15 IU/L (ref 0–40)
Albumin/Globulin Ratio: 2.1 (ref 1.2–2.2)
Albumin: 4.4 g/dL (ref 3.5–5.5)
Alkaline Phosphatase: 58 IU/L (ref 39–117)
BUN/Creatinine Ratio: 19 (ref 9–23)
BUN: 16 mg/dL (ref 6–24)
Bilirubin Total: 0.9 mg/dL (ref 0.0–1.2)
CO2: 23 mmol/L (ref 20–29)
Calcium: 9.1 mg/dL (ref 8.7–10.2)
Chloride: 105 mmol/L (ref 96–106)
Creatinine, Ser: 0.86 mg/dL (ref 0.57–1.00)
GFR calc Af Amer: 91 mL/min/{1.73_m2} (ref >59)
GFR calc non Af Amer: 79 mL/min/{1.73_m2} (ref >59)
Globulin, Total: 2.1 g/dL (ref 1.5–4.5)
Glucose: 97 mg/dL (ref 65–99)
Potassium: 3.7 mmol/L (ref 3.5–5.2)
Sodium: 141 mmol/L (ref 134–144)
Total Protein: 6.5 g/dL (ref 6.0–8.5)

## 2018-06-04 LAB — SPECIMEN STATUS REPORT

## 2018-06-04 LAB — TSH: TSH: 1.64 u[IU]/mL (ref 0.450–4.500)

## 2018-06-08 MED FILL — TAMSULOSIN HCL 0.4 MG CAP: 0.4 | 30 days supply | Qty: 30 | Fill #6

## 2018-07-08 MED FILL — TAMSULOSIN HCL 0.4 MG CAP: 0.4 | 30 days supply | Qty: 30 | Fill #7

## 2018-07-29 ENCOUNTER — Telehealth: Payer: No Typology Code available for payment source | Admitting: Nurse Practitioner

## 2018-07-29 DIAGNOSIS — M545 Low back pain, unspecified: Secondary | ICD-10-CM

## 2018-07-29 MED ORDER — NAPROXEN 500 MG PO TABS: 500.0000 mg | ORAL_TABLET | Freq: Two times a day (BID) | ORAL | 1 refills | Status: DC

## 2018-07-29 MED ORDER — CYCLOBENZAPRINE HCL 10 MG PO TABS: 10.0000 mg | ORAL_TABLET | Freq: Three times a day (TID) | ORAL | 1 refills | Status: DC | PRN

## 2018-07-29 MED FILL — CYCLOBENZAPRINE 10 MG TAB: 10 | 10 days supply | Qty: 30 | Fill #0

## 2018-07-29 MED FILL — NAPROXEN 500 MG TABLET: 500 | 30 days supply | Qty: 60 | Fill #0

## 2018-07-29 NOTE — Progress Notes (Signed)

## 2018-08-06 MED FILL — TAMSULOSIN HCL 0.4 MG CAP: 0.4 | 30 days supply | Qty: 30 | Fill #8

## 2018-08-27 ENCOUNTER — Ambulatory Visit (INDEPENDENT_AMBULATORY_CARE_PROVIDER_SITE_OTHER): Payer: No Typology Code available for payment source | Admitting: Family Medicine

## 2018-08-27 DIAGNOSIS — Z23 Encounter for immunization: Secondary | ICD-10-CM

## 2018-09-04 MED FILL — TAMSULOSIN HCL 0.4 MG CAP: 0.4 | 30 days supply | Qty: 30 | Fill #9

## 2018-09-28 ENCOUNTER — Ambulatory Visit: Payer: Self-pay

## 2018-09-28 NOTE — Telephone Encounter (Signed)
Pt called with C/O high BP starting last week. She has been out of town and just getting back today. BP this AM was 163/93 before getting out of bed. Pt does not have a Dx of hypertension but at last visit she was told to monitor because there was some elevation. She has a headache but states she has neck issues so she always has a headache. She denies blurred vision or weakness or chest pain. Pt states last week her chest felt a little tight but nothing since then. Pt was advised that she should see a physician within 4 hours or go to urgent care. Pt verbalized that understanding but requested instead to have an appointment tomorrow with the practice. Care advice read to patient.  Patient verbalized understanding of all instructions.   Reason for Disposition . [9] Systolic BP  >= 373 OR Diastolic >= 428  AND [7] having NO cardiac or neurologic symptoms  Answer Assessment - Initial Assessment Questions 1. BLOOD PRESSURE: "What is the blood pressure?" "Did you take at least two measurements 5 minutes apart?"     163/93  2. ONSET: "When did you take your blood pressure?"     This AM 3. HOW: "How did you obtain the blood pressure?" (e.g., visiting nurse, automatic home BP monitor)     home 4. HISTORY: "Do you have a history of high blood pressure?"     no 5. MEDICATIONS: "Are you taking any medications for blood pressure?" "Have you missed any doses recently?"     none 6. OTHER SYMPTOMS: "Do you have any symptoms?" (e.g., headache, chest pain, blurred vision, difficulty breathing, weakness)     headache 7. PREGNANCY: "Is there any chance you are pregnant?" "When was your last menstrual period?"     histerectomy  Protocols used: HIGH BLOOD PRESSURE-A-AH

## 2018-09-29 ENCOUNTER — Encounter: Payer: Self-pay | Admitting: Family Medicine

## 2018-09-29 ENCOUNTER — Ambulatory Visit (INDEPENDENT_AMBULATORY_CARE_PROVIDER_SITE_OTHER): Payer: No Typology Code available for payment source | Admitting: Family Medicine

## 2018-09-29 VITALS — BP 147/90 | HR 63 | Temp 97.9°F | Resp 20 | Ht 65.0 in | Wt 166.0 lb

## 2018-09-29 DIAGNOSIS — I10 Essential (primary) hypertension: Secondary | ICD-10-CM

## 2018-09-29 MED ORDER — LISINOPRIL 5 MG PO TABS: 5.0000 mg | ORAL_TABLET | Freq: Every day | ORAL | 0 refills | Status: DC

## 2018-09-29 MED FILL — LISINOPRIL 5 MG TABLET: 5 | 30 days supply | Qty: 30 | Fill #0

## 2018-09-29 NOTE — Progress Notes (Signed)
Sara Burton , November 01, 1966, 52 y.o., female MRN: [id number] Patient Care Team    Relationship Specialty Notifications Start End  McGowen, Adrian Blackwater, MD PCP - General Family Medicine  10/28/11   Christophe Louis, MD Consulting Physician Obstetrics and Gynecology  02/18/12   Irine Seal, MD Consulting Physician Urology  12/19/17     Chief Complaint  Patient presents with  . Hypertension     Subjective: Pt presents for an OV with complaints of elevated BP. She and her PCP have been monitoring her borderline BP for a few months. She reports her BP has been routinely above goal for a few weeks. Highest 195/117 - lowest 138/84. She reports when her BP is in the higher range she does get a headache. She states she has occasional swelling in her ankles, but not much.  Patient denies chest pain, shortness of breath, dizziness or lower extremity edema.   Depression screen [facility] 2/9 05/28/2018 04/08/2016  Decreased Interest 0 0  Down, Depressed, Hopeless 1 0  PHQ - 2 Score 1 0  Altered sleeping 2 -  Tired, decreased energy 1 -  Change in appetite 0 -  Feeling bad or failure about yourself  1 -  Trouble concentrating 0 -  Moving slowly or fidgety/restless 0 -  Suicidal thoughts 0 -  PHQ-9 Score 5 -  Difficult doing work/chores Somewhat difficult -    No Known Allergies Social History   Tobacco Use  . Smoking status: Never Smoker  . Smokeless tobacco: Never Used  Substance Use Topics  . Alcohol use: No   Past Medical History:  Diagnosis Date  . Acute urinary retention 2018   Once after hysterectomy surgery 2012, another spontaneous occurrence 11/2017--Dr. Jeffie Pollock started her on flomax 12/2017--much improved as of 12/22/2017 and 03/2018 urol f/u.  Marland Kitchen ALLERGIC RHINITIS 03/31/2010   Qualifier: Diagnosis of  By: Alveta Heimlich MD, Cornelia Copa    . Asthma   . Bilateral external ear infections   . Cervical spondylosis 2011   w/out myelopathy; nerve block injections done 08/2010 and 10/2010 (Dr. Letta Pate)  .  Fibroids, submucosal    u/s 07/2010  . Heart murmur    Pt reports this is present intermittently, says past ECHO normal.  . Menorrhagia    Endo ablation 02/14/11  . Migraine    plus tension HA's; some occipital HA's associated with her cervicalgia  . Panic attacks   . Plantar fasciitis   . Shingles    x 2.  Once L rib cage, once L ear.   Past Surgical History:  Procedure Laterality Date  . ABDOMINAL HYSTERECTOMY    . APPENDECTOMY  1979  . CESAREAN SECTION  02/2004  . ENDOMETRIAL ABLATION  02/14/11  . ENDOMETRIAL BIOPSY  12/2010   Benign, with exogenous hormone effect.  No hyperplasia.  Marland Kitchen FINGER SURGERY  1982   Ring finger right hand; no metal/screws in finger.  . ROBOTIC ASSISTED LAPAROSCOPIC VAGINAL HYSTERECTOMY WITH FIBROID REMOVAL  02/17/12   Benign leiomyomata and benign cervix on path   Family History  Problem Relation Age of Onset  . Heart disease Father        CABG in his 30s  . Diabetes Father   . Asthma Brother   . Cancer Paternal Grandmother        Breast; detected in her 35's at the earliest   Allergies as of 09/29/2018   No Known Allergies     Medication List  Accurate as of 09/29/18  3:36 PM. Always use your most recent med list.          cetirizine 10 MG tablet Commonly known as:  ZYRTEC Take 10 mg by mouth daily.   tamsulosin 0.4 MG Caps capsule Commonly known as:  FLOMAX Take 0.4 mg by mouth daily.       All past medical history, surgical history, allergies, family history, immunizations andmedications were updated in the EMR today and reviewed under the history and medication portions of their EMR.     ROS: Negative, with the exception of above mentioned in HPI   Objective:  BP (!) 147/90 (BP Location: Right Arm, Patient Position: Sitting, Cuff Size: Normal)   Pulse 63   Temp 97.9 F (36.6 C)   Resp 20   Ht 5\' 5"  (1.651 m)   Wt 166 lb (75.3 kg)   LMP 01/24/2012   SpO2 98%   BMI 27.62 kg/m  Body mass index is 27.62 kg/m. Gen:  Afebrile. No acute distress. Nontoxic in appearance, well developed, well nourished.  HENT: AT. [place]. MMM Eyes:Pupils Equal Round Reactive to light, Extraocular movements intact,  Conjunctiva without redness, discharge or icterus. CV: RRR no murmur, no edema Chest: CTAB, no wheeze or crackles. Good air movement, normal resp effort.  Neuro: Normal gait. PERLA. EOMi. Alert. Oriented x3    No exam data present No results found. No results found for this or any previous visit (from the past 24 hour(s)).  Assessment/Plan: [name] is a 52 y.o. female present for OV for  Essential hypertension - new formal diagnosis. She and her PCP have been monitoring borderline BP. Her home pressures reviewed today and her elevated BP in office today meet criteria for BP diagnosis.  - start lisinopril 5 mg QD. Monitor and record BP over next week (at least 2 hours after taking med) and bring readings to followup appt.  - labs UTD-06/03/2018 with normal kidney fx.  - low sodium diet and exercise encouraged.  - lisinopril (PRINIVIL,ZESTRIL) 5 MG tablet; Take 1 tablet (5 mg total) by mouth daily.  Dispense: 30 tablet; Refill: 0 - f/u 1 week, with PCP. Pt aware dose may be increased at that visit if needed   Reviewed expectations re: course of current medical issues.  Discussed self-management of symptoms.  Outlined signs and symptoms indicating need for more acute intervention.  Patient verbalized understanding and all questions were answered.  Patient received an After-Visit Summary.    No orders of the defined types were placed in this encounter.   Note is dictated utilizing voice recognition software. Although note has been proof read prior to signing, occasional typographical errors still can be missed. If any questions arise, please do not hesitate to call for verification.   electronically signed by:  Howard Pouch, DO  [place]

## 2018-09-29 NOTE — Patient Instructions (Addendum)
Start lisinopril 5 mg a day in the morning. Record your BP readings (at least 2 hours after medicines).   Followup with Dr. Genelle Gather in 1 week, he will adjust if needed.  BP should be <135/85, but ideally in the 120s/70s.  Low sodium/salt meals. Increase exercise.  If anxiety continues to be an issue, may need to consider treatment with daily med for anxiety, you can talk to your PCP if you desire assistance.     Hypertension Hypertension, commonly called high blood pressure, is when the force of blood pumping through the arteries is too strong. The arteries are the blood vessels that carry blood from the heart throughout the body. Hypertension forces the heart to work harder to pump blood and may cause arteries to become narrow or stiff. Having untreated or uncontrolled hypertension can cause heart attacks, strokes, kidney disease, and other problems. A blood pressure reading consists of a higher number over a lower number. Ideally, your blood pressure should be below 120/80. The first ("top") number is called the systolic pressure. It is a measure of the pressure in your arteries as your heart beats. The second ("bottom") number is called the diastolic pressure. It is a measure of the pressure in your arteries as the heart relaxes. What are the causes? The cause of this condition is not known. What increases the risk? Some risk factors for high blood pressure are under your control. Others are not. Factors you can change  Smoking.  Having type 2 diabetes mellitus, high cholesterol, or both.  Not getting enough exercise or physical activity.  Being overweight.  Having too much fat, sugar, calories, or salt (sodium) in your diet.  Drinking too much alcohol. Factors that are difficult or impossible to change  Having chronic kidney disease.  Having a family history of high blood pressure.  Age. Risk increases with age.  Race. You may be at higher risk if you are  African-American.  Gender. Men are at higher risk than women before age 7. After age 43, women are at higher risk than men.  Having obstructive sleep apnea.  Stress. What are the signs or symptoms? Extremely high blood pressure (hypertensive crisis) may cause:  Headache.  Anxiety.  Shortness of breath.  Nosebleed.  Nausea and vomiting.  Severe chest pain.  Jerky movements you cannot control (seizures).  How is this diagnosed? This condition is diagnosed by measuring your blood pressure while you are seated, with your arm resting on a surface. The cuff of the blood pressure monitor will be placed directly against the skin of your upper arm at the level of your heart. It should be measured at least twice using the same arm. Certain conditions can cause a difference in blood pressure between your right and left arms. Certain factors can cause blood pressure readings to be lower or higher than normal (elevated) for a short period of time:  When your blood pressure is higher when you are in a health care provider's office than when you are at home, this is called white coat hypertension. Most people with this condition do not need medicines.  When your blood pressure is higher at home than when you are in a health care provider's office, this is called masked hypertension. Most people with this condition may need medicines to control blood pressure.  If you have a high blood pressure reading during one visit or you have normal blood pressure with other risk factors:  You may be asked to return  on a different day to have your blood pressure checked again.  You may be asked to monitor your blood pressure at home for 1 week or longer.  If you are diagnosed with hypertension, you may have other blood or imaging tests to help your health care provider understand your overall risk for other conditions. How is this treated? This condition is treated by making healthy lifestyle changes,  such as eating healthy foods, exercising more, and reducing your alcohol intake. Your health care provider may prescribe medicine if lifestyle changes are not enough to get your blood pressure under control, and if:  Your systolic blood pressure is above 130.  Your diastolic blood pressure is above 80.  Your personal target blood pressure may vary depending on your medical conditions, your age, and other factors. Follow these instructions at home: Eating and drinking  Eat a diet that is high in fiber and potassium, and low in sodium, added sugar, and fat. An example eating plan is called the DASH (Dietary Approaches to Stop Hypertension) diet. To eat this way: ? Eat plenty of fresh fruits and vegetables. Try to fill half of your plate at each meal with fruits and vegetables. ? Eat whole grains, such as whole wheat pasta, brown rice, or whole grain bread. Fill about one quarter of your plate with whole grains. ? Eat or drink low-fat dairy products, such as skim milk or low-fat yogurt. ? Avoid fatty cuts of meat, processed or cured meats, and poultry with skin. Fill about one quarter of your plate with lean proteins, such as fish, chicken without skin, beans, eggs, and tofu. ? Avoid premade and processed foods. These tend to be higher in sodium, added sugar, and fat.  Reduce your daily sodium intake. Most people with hypertension should eat less than 1,500 mg of sodium a day.  Limit alcohol intake to no more than 1 drink a day for nonpregnant women and 2 drinks a day for men. One drink equals 12 oz of beer, 5 oz of wine, or 1 oz of hard liquor. Lifestyle  Work with your health care provider to maintain a healthy body weight or to lose weight. Ask what an ideal weight is for you.  Get at least 30 minutes of exercise that causes your heart to beat faster (aerobic exercise) most days of the week. Activities may include walking, swimming, or biking.  Include exercise to strengthen your muscles  (resistance exercise), such as pilates or lifting weights, as part of your weekly exercise routine. Try to do these types of exercises for 30 minutes at least 3 days a week.  Do not use any products that contain nicotine or tobacco, such as cigarettes and e-cigarettes. If you need help quitting, ask your health care provider.  Monitor your blood pressure at home as told by your health care provider.  Keep all follow-up visits as told by your health care provider. This is important. Medicines  Take over-the-counter and prescription medicines only as told by your health care provider. Follow directions carefully. Blood pressure medicines must be taken as prescribed.  Do not skip doses of blood pressure medicine. Doing this puts you at risk for problems and can make the medicine less effective.  Ask your health care provider about side effects or reactions to medicines that you should watch for. Contact a health care provider if:  You think you are having a reaction to a medicine you are taking.  You have headaches that keep coming back (recurring).  You feel dizzy.  You have swelling in your ankles.  You have trouble with your vision. Get help right away if:  You develop a severe headache or confusion.  You have unusual weakness or numbness.  You feel faint.  You have severe pain in your chest or abdomen.  You vomit repeatedly.  You have trouble breathing. Summary  Hypertension is when the force of blood pumping through your arteries is too strong. If this condition is not controlled, it may put you at risk for serious complications.  Your personal target blood pressure may vary depending on your medical conditions, your age, and other factors. For most people, a normal blood pressure is less than 120/80.  Hypertension is treated with lifestyle changes, medicines, or a combination of both. Lifestyle changes include weight loss, eating a healthy, low-sodium diet, exercising  more, and limiting alcohol. This information is not intended to replace advice given to you by your health care provider. Make sure you discuss any questions you have with your health care provider. Document Released: 11/25/2005 Document Revised: 10/23/2016 Document Reviewed: 10/23/2016 Elsevier Interactive Patient Education  [employer].

## 2018-10-06 MED FILL — TAMSULOSIN HCL 0.4 MG CAP: 0.4 | 30 days supply | Qty: 30 | Fill #10

## 2018-10-07 ENCOUNTER — Encounter: Payer: Self-pay | Admitting: Family Medicine

## 2018-10-07 ENCOUNTER — Ambulatory Visit (INDEPENDENT_AMBULATORY_CARE_PROVIDER_SITE_OTHER): Payer: No Typology Code available for payment source | Admitting: Family Medicine

## 2018-10-07 VITALS — BP 121/77 | HR 63 | Temp 98.2°F | Resp 16 | Ht 65.0 in | Wt 166.5 lb

## 2018-10-07 DIAGNOSIS — I10 Essential (primary) hypertension: Secondary | ICD-10-CM | POA: Diagnosis not present

## 2018-10-07 MED ORDER — LISINOPRIL 10 MG PO TABS: 10.0000 mg | ORAL_TABLET | Freq: Every day | ORAL | 1 refills | Status: DC

## 2018-10-07 MED FILL — LISINOPRIL 10 MG TABLET: 10 | 30 days supply | Qty: 30 | Fill #0

## 2018-10-07 NOTE — Progress Notes (Signed)
OFFICE VISIT  10/07/2018   CC:  Chief Complaint  Patient presents with  . Follow-up    HTN   HPI:    Patient is a 52 y.o. Caucasian female who presents for 8 day f/u new dx HTN. Was started on lisinopril 5mg  qd by Dr. Raoul Pitch 8d/ago.  She had been having bp's in the 190s//120s x 2 wks at home prior to getting on the med.    Interim Hx:  No side effects from med. Home bp's: 109-145/upper 70s-91.  Past Medical History:  Diagnosis Date  . Acute urinary retention 2018   Once after hysterectomy surgery 2012, another spontaneous occurrence 11/2017--Dr. Jeffie Pollock started her on flomax 12/2017--much improved as of 12/22/2017 and 03/2018 urol f/u.  Marland Kitchen ALLERGIC RHINITIS 03/31/2010   Qualifier: Diagnosis of  By: Alveta Heimlich MD, Cornelia Copa    . Asthma   . Bilateral external ear infections   . Cervical spondylosis 2011   w/out myelopathy; nerve block injections done 08/2010 and 10/2010 (Dr. Letta Pate)  . Fibroids, submucosal    u/s 07/2010  . Heart murmur    Pt reports this is present intermittently, says past ECHO normal.  . Menorrhagia    Endo ablation 02/14/11  . Migraine    plus tension HA's; some occipital HA's associated with her cervicalgia  . Panic attacks   . Plantar fasciitis   . Shingles    x 2.  Once L rib cage, once L ear.    Past Surgical History:  Procedure Laterality Date  . ABDOMINAL HYSTERECTOMY    . APPENDECTOMY  1979  . CESAREAN SECTION  02/2004  . ENDOMETRIAL ABLATION  02/14/11  . ENDOMETRIAL BIOPSY  12/2010   Benign, with exogenous hormone effect.  No hyperplasia.  Marland Kitchen FINGER SURGERY  1982   Ring finger right hand; no metal/screws in finger.  . ROBOTIC ASSISTED LAPAROSCOPIC VAGINAL HYSTERECTOMY WITH FIBROID REMOVAL  02/17/12   Benign leiomyomata and benign cervix on path    Outpatient Medications Prior to Visit  Medication Sig Dispense Refill  . cetirizine (ZYRTEC) 10 MG tablet Take 10 mg by mouth daily.    . tamsulosin (FLOMAX) 0.4 MG CAPS capsule Take 0.4 mg by mouth daily.   11  . lisinopril (PRINIVIL,ZESTRIL) 5 MG tablet Take 1 tablet (5 mg total) by mouth daily. 30 tablet 0   No facility-administered medications prior to visit.     No Known Allergies  ROS As per HPI  PE: Blood pressure 121/77, pulse 63, temperature 98.2 F (36.8 C), temperature source Oral, resp. rate 16, height 5\' 5"  (1.651 m), weight 166 lb 8 oz (75.5 kg), last menstrual period 01/24/2012, SpO2 98 %. Gen: Alert, well appearing.  Patient is oriented to person, place, time, and situation. AFFECT: pleasant, lucid thought and speech. No further exam today.  LABS:    Chemistry      Component Value Date/Time   NA 141 06/03/2018 0825   K 3.7 06/03/2018 0825   CL 105 06/03/2018 0825   CO2 23 06/03/2018 0825   BUN 16 06/03/2018 0825   CREATININE 0.86 06/03/2018 0825      Component Value Date/Time   CALCIUM 9.1 06/03/2018 0825   ALKPHOS 58 06/03/2018 0825   AST 15 06/03/2018 0825   ALT 13 06/03/2018 0825   BILITOT 0.9 06/03/2018 0825      IMPRESSION AND PLAN:  HTN, recent new dx: improved on lisinopril 5mg  qd. Discussed goal of 130/80 or better-->she's not quite there. Discussed low Na diet/DASH diet  and exercise importance. Increase to lisinopril 10 mg qd. BMET today.  An After Visit Summary was printed and given to the patient.  FOLLOW UP: Return in about 4 weeks (around 11/04/2018) for f/u HTN.  Signed:  Crissie Sickles, MD           10/07/2018

## 2018-10-07 NOTE — Patient Instructions (Signed)
DASH Eating Plan DASH stands for "Dietary Approaches to Stop Hypertension." The DASH eating plan is a healthy eating plan that has been shown to reduce high blood pressure (hypertension). It may also reduce your risk for type 2 diabetes, heart disease, and stroke. The DASH eating plan may also help with weight loss. What are tips for following this plan? General guidelines  Avoid eating more than 2,300 mg (milligrams) of salt (sodium) a day. If you have hypertension, you may need to reduce your sodium intake to 1,500 mg a day.  Limit alcohol intake to no more than 1 drink a day for nonpregnant women and 2 drinks a day for men. One drink equals 12 oz of beer, 5 oz of wine, or 1 oz of hard liquor.  Work with your health care provider to maintain a healthy body weight or to lose weight. Ask what an ideal weight is for you.  Get at least 30 minutes of exercise that causes your heart to beat faster (aerobic exercise) most days of the week. Activities may include walking, swimming, or biking.  Work with your health care provider or diet and nutrition specialist (dietitian) to adjust your eating plan to your individual calorie needs. Reading food labels  Check food labels for the amount of sodium per serving. Choose foods with less than 5 percent of the Daily Value of sodium. Generally, foods with less than 300 mg of sodium per serving fit into this eating plan.  To find whole grains, look for the word "whole" as the first word in the ingredient list. Shopping  Buy products labeled as "low-sodium" or "no salt added."  Buy fresh foods. Avoid canned foods and premade or frozen meals. Cooking  Avoid adding salt when cooking. Use salt-free seasonings or herbs instead of table salt or sea salt. Check with your health care provider or pharmacist before using salt substitutes.  Do not fry foods. Cook foods using healthy methods such as baking, boiling, grilling, and broiling instead.  Cook with  heart-healthy oils, such as olive, canola, soybean, or sunflower oil. Meal planning   Eat a balanced diet that includes: ? 5 or more servings of fruits and vegetables each day. At each meal, try to fill half of your plate with fruits and vegetables. ? Up to 6-8 servings of whole grains each day. ? Less than 6 oz of lean meat, poultry, or fish each day. A 3-oz serving of meat is about the same size as a deck of cards. One egg equals 1 oz. ? 2 servings of low-fat dairy each day. ? A serving of nuts, seeds, or beans 5 times each week. ? Heart-healthy fats. Healthy fats called Omega-3 fatty acids are found in foods such as flaxseeds and coldwater fish, like sardines, salmon, and mackerel.  Limit how much you eat of the following: ? Canned or prepackaged foods. ? Food that is high in trans fat, such as fried foods. ? Food that is high in saturated fat, such as fatty meat. ? Sweets, desserts, sugary drinks, and other foods with added sugar. ? Full-fat dairy products.  Do not salt foods before eating.  Try to eat at least 2 vegetarian meals each week.  Eat more home-cooked food and less restaurant, buffet, and fast food.  When eating at a restaurant, ask that your food be prepared with less salt or no salt, if possible. What foods are recommended? The items listed may not be a complete list. Talk with your dietitian about what   dietary choices are best for you. Grains Whole-grain or whole-wheat bread. Whole-grain or whole-wheat pasta. Brown rice. Oatmeal. Quinoa. Bulgur. Whole-grain and low-sodium cereals. Pita bread. Low-fat, low-sodium crackers. Whole-wheat flour tortillas. Vegetables Fresh or frozen vegetables (raw, steamed, roasted, or grilled). Low-sodium or reduced-sodium tomato and vegetable juice. Low-sodium or reduced-sodium tomato sauce and tomato paste. Low-sodium or reduced-sodium canned vegetables. Fruits All fresh, dried, or frozen fruit. Canned fruit in natural juice (without  added sugar). Meat and other protein foods Skinless chicken or turkey. Ground chicken or turkey. Pork with fat trimmed off. Fish and seafood. Egg whites. Dried beans, peas, or lentils. Unsalted nuts, nut butters, and seeds. Unsalted canned beans. Lean cuts of beef with fat trimmed off. Low-sodium, lean deli meat. Dairy Low-fat (1%) or fat-free (skim) milk. Fat-free, low-fat, or reduced-fat cheeses. Nonfat, low-sodium ricotta or cottage cheese. Low-fat or nonfat yogurt. Low-fat, low-sodium cheese. Fats and oils Soft margarine without trans fats. Vegetable oil. Low-fat, reduced-fat, or light mayonnaise and salad dressings (reduced-sodium). Canola, safflower, olive, soybean, and sunflower oils. Avocado. Seasoning and other foods Herbs. Spices. Seasoning mixes without salt. Unsalted popcorn and pretzels. Fat-free sweets. What foods are not recommended? The items listed may not be a complete list. Talk with your dietitian about what dietary choices are best for you. Grains Baked goods made with fat, such as croissants, muffins, or some breads. Dry pasta or rice meal packs. Vegetables Creamed or fried vegetables. Vegetables in a cheese sauce. Regular canned vegetables (not low-sodium or reduced-sodium). Regular canned tomato sauce and paste (not low-sodium or reduced-sodium). Regular tomato and vegetable juice (not low-sodium or reduced-sodium). Pickles. Olives. Fruits Canned fruit in a light or heavy syrup. Fried fruit. Fruit in cream or butter sauce. Meat and other protein foods Fatty cuts of meat. Ribs. Fried meat. Bacon. Sausage. Bologna and other processed lunch meats. Salami. Fatback. Hotdogs. Bratwurst. Salted nuts and seeds. Canned beans with added salt. Canned or smoked fish. Whole eggs or egg yolks. Chicken or turkey with skin. Dairy Whole or 2% milk, cream, and half-and-half. Whole or full-fat cream cheese. Whole-fat or sweetened yogurt. Full-fat cheese. Nondairy creamers. Whipped toppings.  Processed cheese and cheese spreads. Fats and oils Butter. Stick margarine. Lard. Shortening. Ghee. Bacon fat. Tropical oils, such as coconut, palm kernel, or palm oil. Seasoning and other foods Salted popcorn and pretzels. Onion salt, garlic salt, seasoned salt, table salt, and sea salt. Worcestershire sauce. Tartar sauce. Barbecue sauce. Teriyaki sauce. Soy sauce, including reduced-sodium. Steak sauce. Canned and packaged gravies. Fish sauce. Oyster sauce. Cocktail sauce. Horseradish that you find on the shelf. Ketchup. Mustard. Meat flavorings and tenderizers. Bouillon cubes. Hot sauce and Tabasco sauce. Premade or packaged marinades. Premade or packaged taco seasonings. Relishes. Regular salad dressings. Where to find more information:  National Heart, Lung, and Blood Institute: www.nhlbi.nih.gov  American Heart Association: www.heart.org Summary  The DASH eating plan is a healthy eating plan that has been shown to reduce high blood pressure (hypertension). It may also reduce your risk for type 2 diabetes, heart disease, and stroke.  With the DASH eating plan, you should limit salt (sodium) intake to 2,300 mg a day. If you have hypertension, you may need to reduce your sodium intake to 1,500 mg a day.  When on the DASH eating plan, aim to eat more fresh fruits and vegetables, whole grains, lean proteins, low-fat dairy, and heart-healthy fats.  Work with your health care provider or diet and nutrition specialist (dietitian) to adjust your eating plan to your individual   calorie needs. This information is not intended to replace advice given to you by your health care provider. Make sure you discuss any questions you have with your health care provider. Document Released: 11/14/2011 Document Revised: 11/18/2016 Document Reviewed: 11/18/2016 Elsevier Interactive Patient Education  2018 [employer] Inc.  

## 2018-10-08 LAB — BASIC METABOLIC PANEL
BUN: 19 mg/dL (ref 6–23)
CO2: 30 mEq/L (ref 19–32)
Calcium: 9.3 mg/dL (ref 8.4–10.5)
Chloride: 105 mEq/L (ref 96–112)
Creatinine, Ser: 0.83 mg/dL (ref 0.40–1.20)
GFR: 76.99 mL/min (ref >60.00)
Glucose, Bld: 126 mg/dL — ABNORMAL HIGH (ref 70–99)
Potassium: 3.6 mEq/L (ref 3.5–5.1)
Sodium: 140 mEq/L (ref 135–145)

## 2018-11-03 MED FILL — TAMSULOSIN HCL 0.4 MG CAP: 0.4 | 30 days supply | Qty: 30 | Fill #11

## 2018-11-23 MED FILL — LISINOPRIL 10 MG TABS: 10 | 30 days supply | Qty: 30 | Fill #1

## 2018-11-25 ENCOUNTER — Telehealth: Payer: No Typology Code available for payment source | Admitting: Nurse Practitioner

## 2018-11-25 DIAGNOSIS — K591 Functional diarrhea: Secondary | ICD-10-CM | POA: Diagnosis not present

## 2018-11-25 DIAGNOSIS — R110 Nausea: Secondary | ICD-10-CM | POA: Diagnosis not present

## 2018-11-25 MED ORDER — ONDANSETRON HCL 4 MG PO TABS: 4.0000 mg | ORAL_TABLET | Freq: Three times a day (TID) | ORAL | 0 refills | Status: DC | PRN

## 2018-11-25 MED ORDER — CIPROFLOXACIN HCL 500 MG PO TABS: 500.0000 mg | ORAL_TABLET | Freq: Two times a day (BID) | ORAL | 0 refills | Status: DC

## 2018-11-25 MED FILL — CIPROFLOXACIN HCL 500 MG TA: 500 | 5 days supply | Qty: 10 | Fill #0

## 2018-11-25 MED FILL — ONDANSETRON HCL 4 MG TABLET: 4 | 7 days supply | Qty: 20 | Fill #0

## 2018-11-25 NOTE — Progress Notes (Signed)
We are sorry that you are not feeling well.  Here is how we plan to help!  Based on what you have shared with me it looks like you have Acute Infectious Diarrhea.  Most cases of acute diarrhea are due to infections with virus and bacteria and are self-limited conditions lasting less than 14 days.  For your symptoms you may take Imodium 2 mg tablets that are over the counter at your local pharmacy. Take two tablet now and then one after each loose stool up to 6 a day.  Antibiotics are not needed for most people with diarrhea.  Optional: Zofran 4 mg 1 tablet every 8 hours as needed for nausea and vomiting  Optional: I have sent in Cipro 500 mg two tablets twice a day for five days- given due to possible food poisoning.  HOME CARE  We recommend changing your diet to help with your symptoms for the next few days.  Drink plenty of fluids that contain water salt and sugar. Sports drinks such as Gatorade may help.   You may try broths, soups, bananas, applesauce, soft breads, mashed potatoes or crackers.   You are considered infectious for as long as the diarrhea continues. Hand washing or use of alcohol based hand sanitizers is recommend.  It is best to stay out of work or school until your symptoms stop.   GET HELP RIGHT AWAY  If you have dark yellow colored urine or do not pass urine frequently you should drink more fluids.    If your symptoms worsen   If you feel like you are going to pass out (faint)  You have a new problem  MAKE SURE YOU   Understand these instructions.  Will watch your condition.  Will get help right away if you are not doing well or get worse.  Your e-visit answers were reviewed by a board certified advanced clinical practitioner to complete your personal care plan.  Depending on the condition, your plan could have included both over the counter or prescription medications.  If there is a problem please reply  once you have received a response from your  provider.  Your safety is important to [place].  If you have drug allergies check your prescription carefully.    You can use MyChart to ask questions about today's visit, request a non-urgent call back, or ask for a work or school excuse for 24 hours related to this e-Visit. If it has been greater than 24 hours you will need to follow up with your provider, or enter a new e-Visit to address those concerns.   You will get an e-mail in the next two days asking about your experience.  I hope that your e-visit has been valuable and will speed your recovery. Thank you for using e-visits.

## 2018-12-04 ENCOUNTER — Telehealth: Payer: No Typology Code available for payment source | Admitting: Family

## 2018-12-04 DIAGNOSIS — J329 Chronic sinusitis, unspecified: Secondary | ICD-10-CM

## 2018-12-04 DIAGNOSIS — B9789 Other viral agents as the cause of diseases classified elsewhere: Secondary | ICD-10-CM | POA: Diagnosis not present

## 2018-12-04 MED ORDER — FLUTICASONE PROPIONATE 50 MCG/ACT NA SUSP: 1.0000 | Freq: Two times a day (BID) | NASAL | 3 refills | Status: DC

## 2018-12-04 MED ORDER — BENZONATATE 100 MG PO CAPS: 100.0000 mg | ORAL_CAPSULE | Freq: Three times a day (TID) | ORAL | 0 refills | Status: DC | PRN

## 2018-12-04 MED FILL — FLUTICASONE PROP 50 MCG SPR: 50 | 30 days supply | Qty: 16 | Fill #0

## 2018-12-04 MED FILL — BENZONATATE 100 MG CAPS: 100 | 5 days supply | Qty: 30 | Fill #0

## 2018-12-04 NOTE — Progress Notes (Signed)
Thank you for the details you included in the comment boxes. Those details are very helpful in determining the best course of treatment for you and help [place] to provide the best care.  Providers prescribe antibiotics to treat infections caused by bacteria. Antibiotics are very powerful in treating bacterial infections when they are used properly. To maintain their effectiveness, they should be used only when necessary. Overuse of antibiotics has resulted in the development of superbugs that are resistant to treatment.   After careful review of your answers, I would not recommend an antibiotic for your condition.  Antibiotics are not effective against viruses and therefore should not be used to treat them. Common examples of infections caused by viruses include colds and flu, as well as viral sinusitis.  We are sorry that you are not feeling well.  Here is how we plan to help!  Based on what you have shared with me it looks like you have sinusitis.  Sinusitis is inflammation and infection in the sinus cavities of the head.  Based on your presentation I believe you most likely have Acute Viral Sinusitis.This is an infection most likely caused by a virus. There is not specific treatment for viral sinusitis other than to help you with the symptoms until the infection runs its course.  You may use an oral decongestant such as Mucinex D or if you have glaucoma or high blood pressure use plain Mucinex. Saline nasal spray help and can safely be used as often as needed for congestion, I have prescribed: Fluticasone nasal spray two sprays in each nostril once a day   I have also sent Tessalon Perles 100mg , take 1 or 2 every 8 hours as needed for cough.   Some authorities believe that zinc sprays or the use of Echinacea may shorten the course of your symptoms.  Sinus infections are not as easily transmitted as other respiratory infection, however we still recommend that you avoid close contact with loved ones,  especially the very young and elderly.  Remember to wash your hands thoroughly throughout the day as this is the number one way to prevent the spread of infection!  Home Care:  Only take medications as instructed by your medical team.  Do not take these medications with alcohol.  A steam or ultrasonic humidifier can help congestion.  You can place a towel over your head and breathe in the steam from hot water coming from a faucet.  Avoid close contacts especially the very young and the elderly.  Cover your mouth when you cough or sneeze.  Always remember to wash your hands.  Get Help Right Away If:  You develop worsening fever or sinus pain.  You develop a severe head ache or visual changes.  Your symptoms persist after you have completed your treatment plan.  Make sure you  Understand these instructions.  Will watch your condition.  Will get help right away if you are not doing well or get worse.  Your e-visit answers were reviewed by a board certified advanced clinical practitioner to complete your personal care plan.  Depending on the condition, your plan could have included both over the counter or prescription medications.  If there is a problem please reply  once you have received a response from your provider.  Your safety is important to [place].  If you have drug allergies check your prescription carefully.    You can use MyChart to ask questions about today's visit, request a non-urgent call back, or ask  for a work or school excuse for 24 hours related to this e-Visit. If it has been greater than 24 hours you will need to follow up with your provider, or enter a new e-Visit to address those concerns.  You will get an e-mail in the next two days asking about your experience.  I hope that your e-visit has been valuable and will speed your recovery. Thank you for using e-visits.

## 2018-12-17 ENCOUNTER — Ambulatory Visit (INDEPENDENT_AMBULATORY_CARE_PROVIDER_SITE_OTHER): Payer: No Typology Code available for payment source | Admitting: Family Medicine

## 2018-12-17 ENCOUNTER — Encounter: Payer: Self-pay | Admitting: Family Medicine

## 2018-12-17 VITALS — BP 129/88 | HR 67 | Temp 98.2°F | Resp 16 | Ht 66.0 in | Wt 160.0 lb

## 2018-12-17 DIAGNOSIS — M5432 Sciatica, left side: Secondary | ICD-10-CM

## 2018-12-17 DIAGNOSIS — R69 Illness, unspecified: Secondary | ICD-10-CM

## 2018-12-17 DIAGNOSIS — J111 Influenza due to unidentified influenza virus with other respiratory manifestations: Secondary | ICD-10-CM

## 2018-12-17 DIAGNOSIS — J209 Acute bronchitis, unspecified: Secondary | ICD-10-CM

## 2018-12-17 MED ORDER — LISINOPRIL 10 MG PO TABS: 10.0000 mg | ORAL_TABLET | Freq: Every day | ORAL | 1 refills | Status: DC

## 2018-12-17 MED ORDER — PREDNISONE 20 MG PO TABS: ORAL_TABLET | ORAL | 0 refills | Status: DC

## 2018-12-17 MED ORDER — HYDROCODONE-HOMATROPINE 5-1.5 MG/5ML PO SYRP: ORAL_SOLUTION | ORAL | 0 refills | Status: DC

## 2018-12-17 MED FILL — LISINOPRIL 10 MG TABS: 10 | 90 days supply | Qty: 90 | Fill #0

## 2018-12-17 NOTE — Progress Notes (Signed)
OFFICE VISIT  12/17/2018   CC:  Chief Complaint  Patient presents with  . Follow-up    HTN  . Cough    then gets pain down Lt leg, feels like lighten blots   HPI:    Patient is a 53 y.o. Caucasian female who presents for 2 mo f/u uncontrolled HTN. At last visit her bp's were improved on lisinopril 5mg  qd, but not at goal of <130/80. I increased her lisinopril to 10mg  qd at that time.  BMET at that time was normal.  Home bp monitoring now consistently <130/80. Mom has recently had flu-like illness.  She had fever, cough, n/v, nasal congestion. About 10 d/a she did MD live through [facility] and was empirically treated with tamiflu.  She was rx'd tessalon pearls and is taking dimetapp.  She says tessalon does not help. Chest still feeling heavy and still with bad cough which is making her have L leg sciatica pain. Use of inhaler had helped her cough minimally.  Past Medical History:  Diagnosis Date  . Acute urinary retention 2018   Once after hysterectomy surgery 2012, another spontaneous occurrence 11/2017--Dr. Jeffie Pollock started her on flomax 12/2017--much improved as of 12/22/2017 and 03/2018 urol f/u.  Marland Kitchen ALLERGIC RHINITIS 03/31/2010   Qualifier: Diagnosis of  By: Alveta Heimlich MD, Cornelia Copa    . Asthma   . Bilateral external ear infections   . Cervical spondylosis 2011   w/out myelopathy; nerve block injections done 08/2010 and 10/2010 (Dr. Letta Pate)  . Fibroids, submucosal    u/s 07/2010  . Heart murmur    Pt reports this is present intermittently, says past ECHO normal.  . Menorrhagia    Endo ablation 02/14/11  . Migraine    plus tension HA's; some occipital HA's associated with her cervicalgia  . Panic attacks   . Plantar fasciitis   . Shingles    x 2.  Once L rib cage, once L ear.    Past Surgical History:  Procedure Laterality Date  . ABDOMINAL HYSTERECTOMY    . APPENDECTOMY  1979  . CESAREAN SECTION  02/2004  . ENDOMETRIAL ABLATION  02/14/11  . ENDOMETRIAL BIOPSY  12/2010   Benign, with  exogenous hormone effect.  No hyperplasia.  Marland Kitchen FINGER SURGERY  1982   Ring finger right hand; no metal/screws in finger.  . ROBOTIC ASSISTED LAPAROSCOPIC VAGINAL HYSTERECTOMY WITH FIBROID REMOVAL  02/17/12   Benign leiomyomata and benign cervix on path    Outpatient Medications Prior to Visit  Medication Sig Dispense Refill  . cetirizine (ZYRTEC) 10 MG tablet Take 10 mg by mouth daily.    . fluticasone (FLONASE) 50 MCG/ACT nasal spray Place 1 spray into both nostrils 2 (two) times daily. 16 g 3  . ondansetron (ZOFRAN) 4 MG tablet Take 1 tablet (4 mg total) by mouth every 8 (eight) hours as needed for nausea or vomiting. 20 tablet 0  . tamsulosin (FLOMAX) 0.4 MG CAPS capsule Take 0.4 mg by mouth daily.  11  . benzonatate (TESSALON PERLES) 100 MG capsule Take 1-2 capsules (100-200 mg total) by mouth every 8 (eight) hours as needed for cough. 30 capsule 0  . lisinopril (PRINIVIL,ZESTRIL) 10 MG tablet Take 1 tablet (10 mg total) by mouth daily. 30 tablet 1  . ciprofloxacin (CIPRO) 500 MG tablet Take 1 tablet (500 mg total) by mouth 2 (two) times daily. (Patient not taking: Reported on 12/17/2018) 10 tablet 0   No facility-administered medications prior to visit.     No Known  Allergies  ROS As per HPI  PE: Blood pressure 129/88, pulse 67, temperature 98.2 F (36.8 C), temperature source Oral, resp. rate 16, height 5\' 6"  (1.676 m), weight 160 lb (72.6 kg), last menstrual period 01/24/2012, SpO2 99 %. VS: noted--normal. Gen: alert, NAD, NONTOXIC APPEARING. HEENT: eyes without injection, drainage, or swelling.  Ears: EACs clear, TMs with normal light reflex and landmarks.  Nose: Clear rhinorrhea, with some dried, crusty exudate adherent to mildly injected mucosa.  No purulent d/c.  No paranasal sinus TTP.  No facial swelling.  Throat and mouth without focal lesion.  No pharyngial swelling, erythema, or exudate.   Neck: supple, no LAD.   LUNGS: CTA bilat, nonlabored resps.  No wheezing but coughs  easily with exhalation.  Aeration is good. CV: RRR, no m/r/g. EXT: no c/c/e SKIN: no rash Sitting SLR neg bilat.  Mild TTP around L ischial tuberosity.  LE strength 5/5 prox/dist bilat.  Patellar and achilles DTRs symmetric.      LABS:    Chemistry      Component Value Date/Time   NA 140 10/07/2018 1612   NA 141 06/03/2018 0825   K 3.6 10/07/2018 1612   CL 105 10/07/2018 1612   CO2 30 10/07/2018 1612   BUN 19 10/07/2018 1612   BUN 16 06/03/2018 0825   CREATININE 0.83 10/07/2018 1612      Component Value Date/Time   CALCIUM 9.3 10/07/2018 1612   ALKPHOS 58 06/03/2018 0825   AST 15 06/03/2018 0825   ALT 13 06/03/2018 0825   BILITOT 0.9 06/03/2018 0825       IMPRESSION AND PLAN:  ILI with bronchitis/RAD-->prednisone 40mg  qd x 5d. Hycodan syrup 1-2 tsp po qhs prn, #120 ml. Mucinex dm q12h prn. Fluids, rest, saline nasal spray. She has mild sciatic nerve stretch/irritation from excessive forceful coughing.  An After Visit Summary was printed and given to the patient.  FOLLOW UP: Return if symptoms worsen or fail to improve.  Signed:  Crissie Sickles, MD           12/17/2018

## 2018-12-22 ENCOUNTER — Telehealth: Payer: No Typology Code available for payment source | Admitting: Family

## 2018-12-22 DIAGNOSIS — B9689 Other specified bacterial agents as the cause of diseases classified elsewhere: Secondary | ICD-10-CM

## 2018-12-22 DIAGNOSIS — J208 Acute bronchitis due to other specified organisms: Secondary | ICD-10-CM | POA: Diagnosis not present

## 2018-12-22 MED ORDER — PREDNISONE 5 MG PO TABS: 5.0000 mg | ORAL_TABLET | ORAL | 0 refills | Status: DC

## 2018-12-22 MED ORDER — DOXYCYCLINE HYCLATE 100 MG PO TABS: 100.0000 mg | ORAL_TABLET | Freq: Two times a day (BID) | ORAL | 0 refills | Status: DC

## 2018-12-22 MED FILL — predniSONE 5 MG TABS: 5 | 6 days supply | Qty: 21 | Fill #0

## 2018-12-22 MED FILL — DOXYCYCLINE HYCLATE 100 MG: 100 | 7 days supply | Qty: 14 | Fill #0

## 2018-12-22 NOTE — Progress Notes (Signed)
Thank you for the details you included in the comment boxes. Those details are very helpful in determining the best course of treatment for you and help [place] to provide the best care. I read your notes from recent office visit and I see you were treated as a viral infection. Given the length of time, may be bacterial at this point. See plan below. If you have a failure to improve in the next 48-72 hours, please be seen face to face again.    We are sorry that you are not feeling well.  Here is how we plan to help!  Based on your presentation I believe you most likely have A cough due to bacteria.  When patients have a fever and a productive cough with a change in color or increased sputum production, we are concerned about bacterial bronchitis.  If left untreated it can progress to pneumonia.  If your symptoms do not improve with your treatment plan it is important that you contact your provider.   I have prescribed Doxycycline 100 mg twice a day for 7 days     In addition you may use A non-prescription cough medication called Mucinex DM: take 2 tablets every 12 hours.  Prednisone 5 mg daily for 6 days (see taper instructions below)  Directions for 6 day taper: Day 1: 2 tablets before breakfast, 1 after both lunch & dinner and 2 at bedtime Day 2: 1 tab before breakfast, 1 after both lunch & dinner and 2 at bedtime Day 3: 1 tab at each meal & 1 at bedtime Day 4: 1 tab at breakfast, 1 at lunch, 1 at bedtime Day 5: 1 tab at breakfast & 1 tab at bedtime Day 6: 1 tab at breakfast  I have also added an Albuterol inhaler, take 2 puffs every 6 hours as needed for shortness of breath.    From your responses in the eVisit questionnaire you describe inflammation in the upper respiratory tract which is causing a significant cough.  This is commonly called Bronchitis and has four common causes:    Allergies  Viral Infections  Acid Reflux  Bacterial Infection Allergies, viruses and acid reflux are  treated by controlling symptoms or eliminating the cause. An example might be a cough caused by taking certain blood pressure medications. You stop the cough by changing the medication. Another example might be a cough caused by acid reflux. Controlling the reflux helps control the cough.  USE OF BRONCHODILATOR ("RESCUE") INHALERS: There is a risk from using your bronchodilator too frequently.  The risk is that over-reliance on a medication which only relaxes the muscles surrounding the breathing tubes can reduce the effectiveness of medications prescribed to reduce swelling and congestion of the tubes themselves.  Although you feel brief relief from the bronchodilator inhaler, your asthma may actually be worsening with the tubes becoming more swollen and filled with mucus.  This can delay other crucial treatments, such as oral steroid medications. If you need to use a bronchodilator inhaler daily, several times per day, you should discuss this with your provider.  There are probably better treatments that could be used to keep your asthma under control.     HOME CARE . Only take medications as instructed by your medical team. . Complete the entire course of an antibiotic. . Drink plenty of fluids and get plenty of rest. . Avoid close contacts especially the very young and the elderly . Cover your mouth if you cough or cough into your  sleeve. . Always remember to wash your hands . A steam or ultrasonic humidifier can help congestion.   GET HELP RIGHT AWAY IF: . You develop worsening fever. . You become short of breath . You cough up blood. . Your symptoms persist after you have completed your treatment plan MAKE SURE YOU   Understand these instructions.  Will watch your condition.  Will get help right away if you are not doing well or get worse.  Your e-visit answers were reviewed by a board certified advanced clinical practitioner to complete your personal care plan.  Depending on the  condition, your plan could have included both over the counter or prescription medications. If there is a problem please reply  once you have received a response from your provider. Your safety is important to [place].  If you have drug allergies check your prescription carefully.    You can use MyChart to ask questions about today's visit, request a non-urgent call back, or ask for a work or school excuse for 24 hours related to this e-Visit. If it has been greater than 24 hours you will need to follow up with your provider, or enter a new e-Visit to address those concerns. You will get an e-mail in the next two days asking about your experience.  I hope that your e-visit has been valuable and will speed your recovery. Thank you for using e-visits.

## 2019-01-06 MED FILL — TAMSULOSIN HCL 0.4 MG CAP: 0.4 | 30 days supply | Qty: 30 | Fill #0

## 2019-01-07 ENCOUNTER — Encounter: Payer: Self-pay | Admitting: Family Medicine

## 2019-02-15 ENCOUNTER — Telehealth: Payer: No Typology Code available for payment source | Admitting: Physician Assistant

## 2019-02-15 ENCOUNTER — Encounter: Payer: Self-pay | Admitting: Physician Assistant

## 2019-02-15 DIAGNOSIS — M549 Dorsalgia, unspecified: Secondary | ICD-10-CM | POA: Diagnosis not present

## 2019-02-15 MED ORDER — NAPROXEN 500 MG PO TABS: 500.0000 mg | ORAL_TABLET | Freq: Two times a day (BID) | ORAL | 0 refills | Status: DC

## 2019-02-15 MED ORDER — TIZANIDINE HCL 2 MG PO TABS: 2.0000 mg | ORAL_TABLET | Freq: Three times a day (TID) | ORAL | 0 refills | Status: DC | PRN

## 2019-02-15 MED FILL — tiZANidine HCL 2 MG TABS: 2 | 4 days supply | Qty: 12 | Fill #0

## 2019-02-15 MED FILL — NAPROXEN 500 MG TABLET: 500 | 10 days supply | Qty: 20 | Fill #0

## 2019-02-15 NOTE — Progress Notes (Signed)
We are sorry that you are not feeling well.  Here is how we plan to help!  Based on what you have shared with me it looks like you mostly have acute back pain.  Acute back pain is defined as musculoskeletal pain that can resolve in 1-3 weeks with conservative treatment.   Ms Momo, Braun have denied any injury to the back. Review of your chart shows you recently has been treated for a cough. Persistent cough can cause back pain, which is called costochondritis. However, if you back pain persists, worsen, or if you develop any new symptoms, follow up with your primary care provider for further workup.   I have prescribed Naprosyn 500 mg twice a day non-steroid anti-inflammatory (NSAID) as well as Tizanidine 2 mg every eight hours as needed which is a muscle relaxer  Some patients experience stomach irritation or in increased heartburn with anti-inflammatory drugs.  Please keep in mind that muscle relaxer's can cause fatigue and should not be taken while at work or driving.  Back pain is very common.  The pain often gets better over time.  The cause of back pain is usually not dangerous.  Most people can learn to manage their back pain on their own.  Home Care  Stay active.  Start with short walks on flat ground if you can.  Try to walk farther each day.  Do not sit, drive or stand in one place for more than 30 minutes.  Do not stay in bed.  Do not avoid exercise or work.  Activity can help your back heal faster.  Be careful when you bend or lift an object.  Bend at your knees, keep the object close to you, and do not twist.  Sleep on a firm mattress.  Lie on your side, and bend your knees.  If you lie on your back, put a pillow under your knees.  Only take medicines as told by your doctor.  Put ice on the injured area.  Put ice in a plastic bag  Place a towel between your skin and the bag  Leave the ice on for 15-20 minutes, 3-4 times a day for the first 2-3 days. 210 After that, you  can switch between ice and heat packs.  Ask your doctor about back exercises or massage.  Avoid feeling anxious or stressed.  Find good ways to deal with stress, such as exercise.  Get Help Right Way If:  Your pain does not go away with rest or medicine.  Your pain does not go away in 1 week.  You have new problems.  You do not feel well.  The pain spreads into your legs.  You cannot control when you poop (bowel movement) or pee (urinate)  You feel sick to your stomach (nauseous) or throw up (vomit)  You have belly (abdominal) pain.  You feel like you may pass out (faint).  If you develop a fever.  Make Sure you:  Understand these instructions.  Will watch your condition  Will get help right away if you are not doing well or get worse.  Your e-visit answers were reviewed by a board certified advanced clinical practitioner to complete your personal care plan.  Depending on the condition, your plan could have included both over the counter or prescription medications.  If there is a problem please reply  once you have received a response from your provider.  Your safety is important to [place].  If you have drug allergies  check your prescription carefully.    You can use MyChart to ask questions about today's visit, request a non-urgent call back, or ask for a work or school excuse for 24 hours related to this e-Visit. If it has been greater than 24 hours you will need to follow up with your provider, or enter a new e-Visit to address those concerns.  You will get an e-mail in the next two days asking about your experience.  I hope that your e-visit has been valuable and will speed your recovery. Thank you for using e-visits.    I have spent 7 min to complete this note. Lacy Duverney [facility]

## 2019-03-30 MED FILL — LISINOPRIL 10 MG TABLET: 10 | 90 days supply | Qty: 90 | Fill #0

## 2019-05-17 MED FILL — NAPROXEN 500 MG TABLET: 500 | 30 days supply | Qty: 60 | Fill #1

## 2019-05-17 MED FILL — CYCLOBENZAPRINE 10 MG TAB: 10 | 10 days supply | Qty: 30 | Fill #1

## 2019-06-18 ENCOUNTER — Telehealth: Payer: Self-pay | Admitting: Family Medicine

## 2019-06-18 NOTE — Telephone Encounter (Signed)
Pls call and get some specifics--how elevated are her blood pressures lately?  Is it elevated on every check?  How often is she checking bp?  -thx

## 2019-06-18 NOTE — Telephone Encounter (Signed)
Elavated blood pressure readings over the last week or so.  Offered virtual visit today. She declined because she cant get the pharmacy in time if there is a change in meds or new prescription is written.  Leaving to go out of town, cannot make an appt next week  Please advise and contact

## 2019-06-18 NOTE — Telephone Encounter (Signed)
Pt takes Lisinopril 10mg  qd. Please advise, thanks.

## 2019-06-18 NOTE — Telephone Encounter (Signed)
Pt stated 2 weeks ago, she felt hot and chest tightness, like indigestion. She was advised to check 1-2 times daily instead of every 2 hours. No med change currently.

## 2019-06-18 NOTE — Telephone Encounter (Signed)
In review of her bp's, there is no pattern that makes me want to increase her bp med. Why is she checking it so often? She can change to checking it 1-2 times a day. She mentioned chest tightness.  Is this the reason why she started checking her bp so often??

## 2019-06-18 NOTE — Telephone Encounter (Signed)
Pt called back with BP readings. She gets really hot and takes pepcid and tums to alleviate chest tightness. She will be out of town all next week. She started out checking BP every hour then switched to every 2 hours. She does take her Lisinopril every night.  Monday 06/14/19    6AM 135/97 7:15am 119/69 136/68 105/69 TUESDAY 06/15/19 126/77 123/73 143/82 152/77 116/73 Wednesday 06/16/19 113/65 107/68 131/75 171/85 139/85 147/85 129/80 Thursday 06/17/19 127/71 133/83 136/75 159/95 122/87 136/79 111/69 Friday 06/18/19 126/80 150/77 151/90 138/78 170/90 177/97

## 2019-06-18 NOTE — Telephone Encounter (Signed)
LM for pt to call back. If calls back, please have her give BP readings, if elevated every check or just lately and how often checking BP.

## 2019-06-28 ENCOUNTER — Other Ambulatory Visit: Payer: Self-pay | Admitting: Family Medicine

## 2019-06-28 MED FILL — LISINOPRIL 10 MG TABLET: 10 | 90 days supply | Qty: 90 | Fill #0

## 2019-09-28 ENCOUNTER — Telehealth: Payer: Self-pay | Admitting: Family Medicine

## 2019-09-28 NOTE — Telephone Encounter (Signed)
Patient is requesting Rx for lisinopril (ZESTRIL) 10 MG tablet [facility] on [address]

## 2019-09-29 NOTE — Telephone Encounter (Signed)
Pt has not been seen since 10/07/2018 and was supposed to F/U in 4 weeks due to increase in BP medication at that appt. Pt needs to schedule appt

## 2019-09-30 ENCOUNTER — Telehealth: Payer: Self-pay | Admitting: Family Medicine

## 2019-09-30 ENCOUNTER — Other Ambulatory Visit: Payer: Self-pay

## 2019-09-30 MED ORDER — LISINOPRIL 10 MG PO TABS: 10.0000 mg | ORAL_TABLET | Freq: Every day | ORAL | 0 refills | Status: DC

## 2019-09-30 NOTE — Telephone Encounter (Signed)
Patient calling back regarding Lisinopril prescription. Per notes, patient needs appt   Scheduled virtual appt with Dr. Anitra Lauth 10/01/19

## 2019-09-30 NOTE — Telephone Encounter (Signed)
Appt made for 10/23, Rx sent for 30 day supply, pt notified.

## 2019-09-30 NOTE — Telephone Encounter (Signed)
Rx sent for 30 day supply, pt notified.

## 2019-10-01 ENCOUNTER — Ambulatory Visit: Payer: No Typology Code available for payment source | Admitting: Family Medicine

## 2019-10-01 MED FILL — LISINOPRIL 10 MG TABS: 10 | 30 days supply | Qty: 30 | Fill #0

## 2019-11-01 ENCOUNTER — Other Ambulatory Visit: Payer: Self-pay

## 2019-11-03 ENCOUNTER — Other Ambulatory Visit: Payer: Self-pay

## 2019-11-03 ENCOUNTER — Ambulatory Visit (INDEPENDENT_AMBULATORY_CARE_PROVIDER_SITE_OTHER): Payer: No Typology Code available for payment source | Admitting: Family Medicine

## 2019-11-03 ENCOUNTER — Encounter: Payer: Self-pay | Admitting: Family Medicine

## 2019-11-03 ENCOUNTER — Other Ambulatory Visit: Payer: Self-pay | Admitting: Family Medicine

## 2019-11-03 VITALS — BP 116/83 | HR 80 | Temp 98.7°F | Resp 16 | Ht 66.0 in | Wt 161.8 lb

## 2019-11-03 DIAGNOSIS — R7301 Impaired fasting glucose: Secondary | ICD-10-CM

## 2019-11-03 DIAGNOSIS — G8929 Other chronic pain: Secondary | ICD-10-CM | POA: Diagnosis not present

## 2019-11-03 DIAGNOSIS — M7918 Myalgia, other site: Secondary | ICD-10-CM

## 2019-11-03 DIAGNOSIS — Z0000 Encounter for general adult medical examination without abnormal findings: Secondary | ICD-10-CM

## 2019-11-03 DIAGNOSIS — Z1211 Encounter for screening for malignant neoplasm of colon: Secondary | ICD-10-CM | POA: Diagnosis not present

## 2019-11-03 DIAGNOSIS — I10 Essential (primary) hypertension: Secondary | ICD-10-CM | POA: Diagnosis not present

## 2019-11-03 DIAGNOSIS — M542 Cervicalgia: Secondary | ICD-10-CM

## 2019-11-03 DIAGNOSIS — Z1231 Encounter for screening mammogram for malignant neoplasm of breast: Secondary | ICD-10-CM

## 2019-11-03 LAB — LIPID PANEL
Cholesterol: 182 mg/dL (ref 0–200)
HDL: 51.6 mg/dL (ref >39.00)
LDL Cholesterol: 111 mg/dL — ABNORMAL HIGH (ref 0–99)
NonHDL: 129.93
Total CHOL/HDL Ratio: 4
Triglycerides: 97 mg/dL (ref 0.0–149.0)
VLDL: 19.4 mg/dL (ref 0.0–40.0)

## 2019-11-03 LAB — CBC WITH DIFFERENTIAL/PLATELET
Basophils Absolute: 0 10*3/uL (ref 0.0–0.1)
Basophils Relative: 0.7 % (ref 0.0–3.0)
Eosinophils Absolute: 0.2 10*3/uL (ref 0.0–0.7)
Eosinophils Relative: 3.7 % (ref 0.0–5.0)
HCT: 43.5 % (ref 36.0–46.0)
Hemoglobin: 14.6 g/dL (ref 12.0–15.0)
Lymphocytes Relative: 26.6 % (ref 12.0–46.0)
Lymphs Abs: 1.5 10*3/uL (ref 0.7–4.0)
MCHC: 33.6 g/dL (ref 30.0–36.0)
MCV: 92.9 fl (ref 78.0–100.0)
Monocytes Absolute: 0.3 10*3/uL (ref 0.1–1.0)
Monocytes Relative: 5.6 % (ref 3.0–12.0)
Neutro Abs: 3.5 10*3/uL (ref 1.4–7.7)
Neutrophils Relative %: 63.4 % (ref 43.0–77.0)
Platelets: 243 10*3/uL (ref 150.0–400.0)
RBC: 4.68 Mil/uL (ref 3.87–5.11)
RDW: 12.8 % (ref 11.5–15.5)
WBC: 5.5 10*3/uL (ref 4.0–10.5)

## 2019-11-03 LAB — COMPREHENSIVE METABOLIC PANEL
ALT: 14 U/L (ref 0–35)
AST: 16 U/L (ref 0–37)
Albumin: 4.4 g/dL (ref 3.5–5.2)
Alkaline Phosphatase: 58 U/L (ref 39–117)
BUN: 16 mg/dL (ref 6–23)
CO2: 24 mEq/L (ref 19–32)
Calcium: 9.5 mg/dL (ref 8.4–10.5)
Chloride: 105 mEq/L (ref 96–112)
Creatinine, Ser: 0.88 mg/dL (ref 0.40–1.20)
GFR: 67.42 mL/min (ref >60.00)
Glucose, Bld: 110 mg/dL — ABNORMAL HIGH (ref 70–99)
Potassium: 4 mEq/L (ref 3.5–5.1)
Sodium: 139 mEq/L (ref 135–145)
Total Bilirubin: 1.2 mg/dL (ref 0.2–1.2)
Total Protein: 7 g/dL (ref 6.0–8.3)

## 2019-11-03 LAB — TSH: TSH: 0.84 u[IU]/mL (ref 0.35–4.50)

## 2019-11-03 MED ORDER — LISINOPRIL 10 MG PO TABS: 10.0000 mg | ORAL_TABLET | Freq: Every day | ORAL | 3 refills | Status: DC

## 2019-11-03 MED ORDER — MELOXICAM 15 MG PO TABS: 15.0000 mg | ORAL_TABLET | Freq: Every day | ORAL | 0 refills | Status: DC

## 2019-11-03 MED FILL — MELOXICAM 15 MG TABLET: 15 | 30 days supply | Qty: 30 | Fill #0

## 2019-11-03 MED FILL — LISINOPRIL 10 MG TABS: 10 | 90 days supply | Qty: 90 | Fill #0

## 2019-11-03 NOTE — Progress Notes (Signed)
Office Note 11/03/2019  CC:  Chief Complaint  Patient presents with  . Annual Exam    pt is fasting    HPI:  Sara Burton is a 53 y.o. White female who is here for annual health maintenance exam. GYN MD: Dr. Christophe Louis.  Works for [employer], Hydrographic surveyor. Exercise: walks regularly, starting to jog, home exercises. Diet: pretty healthy, trying to work on more veggies.  Hx of chronic neck pain, mostly muscular but also with MRI confirmed DDD/spondylosis. Injections required in remote past.  No radicular pain/paresthesias.  No arm weakness. Gives her HAs often.  Uses excedrin daily, heat, ice, otc lidocaine patches.   Hurting a lot lately.  Recalls having PT in the past but says it didn't help much.  Past Medical History:  Diagnosis Date  . Acute urinary retention 2018   Once after hysterectomy surgery 2012, another spontaneous occurrence 11/2017--Dr. Jeffie Pollock started her on flomax 12/2017--much improved as of 12/22/2017 and 03/2018 urol f/u.  Marland Kitchen ALLERGIC RHINITIS 03/31/2010   Qualifier: Diagnosis of  By: Alveta Heimlich MD, Cornelia Copa    . Asthma   . Bilateral external ear infections   . Cervical spondylosis 2011   w/out myelopathy; nerve block injections done 08/2010 and 10/2010 (Dr. Letta Pate)  . Fibroids, submucosal    u/s 07/2010  . Heart murmur    Pt reports this is present intermittently, says past ECHO normal.  . Menorrhagia    Endo ablation 02/14/11  . Migraine    plus tension HA's; some occipital HA's associated with her cervicalgia  . Panic attacks   . Plantar fasciitis   . Shingles    x 2.  Once L rib cage, once L ear.    Past Surgical History:  Procedure Laterality Date  . ABDOMINAL HYSTERECTOMY    . APPENDECTOMY  1979  . CESAREAN SECTION  02/2004  . ENDOMETRIAL ABLATION  02/14/11  . ENDOMETRIAL BIOPSY  12/2010   Benign, with exogenous hormone effect.  No hyperplasia.  Marland Kitchen FINGER SURGERY  1982   Ring finger right hand; no metal/screws in finger.  . ROBOTIC  ASSISTED LAPAROSCOPIC VAGINAL HYSTERECTOMY WITH FIBROID REMOVAL  02/17/12   Benign leiomyomata and benign cervix on path    Family History  Problem Relation Age of Onset  . Heart disease Father        CABG in his 44s  . Diabetes Father   . Asthma Brother   . Cancer Paternal Grandmother        Breast; detected in her 23's at the earliest    Social History   Socioeconomic History  . Marital status: Divorced    Spouse name: Not on file  . Number of children: Not on file  . Years of education: Not on file  . Highest education level: Not on file  Occupational History  . Not on file  Social Needs  . Financial resource strain: Not on file  . Food insecurity    Worry: Not on file    Inability: Not on file  . Transportation needs    Medical: Not on file    Non-medical: Not on file  Tobacco Use  . Smoking status: Never Smoker  . Smokeless tobacco: Never Used  Substance and Sexual Activity  . Alcohol use: No  . Drug use: No  . Sexual activity: Yes  Lifestyle  . Physical activity    Days per week: Not on file    Minutes per session: Not on file  . Stress:  Not on file  Relationships  . Social Herbalist on phone: Not on file    Gets together: Not on file    Attends religious service: Not on file    Active member of club or organization: Not on file    Attends meetings of clubs or organizations: Not on file    Relationship status: Not on file  . Intimate partner violence    Fear of current or ex partner: Not on file    Emotionally abused: Not on file    Physically abused: Not on file    Forced sexual activity: Not on file  Other Topics Concern  . Not on file  Social History Narrative   Married, one son.   Originally from [place].   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   No T/A/Ds.   Has one brother and 1 sister.    Outpatient Medications Prior to Visit  Medication Sig Dispense Refill  . Aspirin-Acetaminophen-Caffeine (EXCEDRIN PO)  Take by mouth every morning.    . cetirizine (ZYRTEC) 10 MG tablet Take 10 mg by mouth daily.    . fluticasone (FLONASE) 50 MCG/ACT nasal spray Place 1 spray into both nostrils 2 (two) times daily. 16 g 3  . lisinopril (ZESTRIL) 10 MG tablet Take 1 tablet (10 mg total) by mouth daily. 30 tablet 0  . cyclobenzaprine (FLEXERIL) 10 MG tablet     . naproxen (NAPROSYN) 500 MG tablet Take 1 tablet (500 mg total) by mouth 2 (two) times daily with a meal. (Patient not taking: Reported on 11/03/2019) 20 tablet 0  . ondansetron (ZOFRAN) 4 MG tablet Take 1 tablet (4 mg total) by mouth every 8 (eight) hours as needed for nausea or vomiting. (Patient not taking: Reported on 11/03/2019) 20 tablet 0  . tamsulosin (FLOMAX) 0.4 MG CAPS capsule Take 0.4 mg by mouth daily.  11  . tiZANidine (ZANAFLEX) 2 MG tablet Take 1 tablet (2 mg total) by mouth every 8 (eight) hours as needed for muscle spasms. (Patient not taking: Reported on 11/03/2019) 12 tablet 0  . ciprofloxacin (CIPRO) 500 MG tablet Take 1 tablet (500 mg total) by mouth 2 (two) times daily. (Patient not taking: Reported on 12/17/2018) 10 tablet 0  . doxycycline (VIBRA-TABS) 100 MG tablet Take 1 tablet (100 mg total) by mouth 2 (two) times daily. 14 tablet 0  . HYDROcodone-homatropine (HYCODAN) 5-1.5 MG/5ML syrup 1-2 tsp po qhs prn cough 120 mL 0  . predniSONE (DELTASONE) 5 MG tablet Take 1 tablet (5 mg total) by mouth as directed. Taper 6,5,4,3,2,1 (Patient not taking: Reported on 11/03/2019) 21 tablet 0   No facility-administered medications prior to visit.     No Known Allergies  ROS Review of Systems  Constitutional: Negative for appetite change, chills, fatigue and fever.  HENT: Negative for congestion, dental problem, ear pain and sore throat.   Eyes: Negative for discharge, redness and visual disturbance.  Respiratory: Negative for cough, chest tightness, shortness of breath and wheezing.   Cardiovascular: Negative for chest pain, palpitations  and leg swelling.  Gastrointestinal: Negative for abdominal pain, blood in stool, diarrhea, nausea and vomiting.  Genitourinary: Negative for difficulty urinating, dysuria, flank pain, frequency, hematuria and urgency.  Musculoskeletal: Positive for myalgias (cervical), neck pain and neck stiffness. Negative for arthralgias, back pain and joint swelling.  Skin: Negative for pallor and rash.  Neurological: Negative for dizziness, speech difficulty, weakness and headaches.  Hematological: Negative for adenopathy. Does not bruise/bleed easily.  Psychiatric/Behavioral: Negative for confusion and sleep disturbance. The patient is not nervous/anxious.    PE; Blood pressure 116/83, pulse 80, temperature 98.7 F (37.1 C), temperature source Temporal, resp. rate 16, height 5\' 6"  (1.676 m), weight 161 lb 12.8 oz (73.4 kg), last menstrual period 01/24/2012, SpO2 98 %. Body mass index is 26.12 kg/m. Exam chaperoned by Deveron Furlong, CMA.  Gen: Alert, well appearing.  Patient is oriented to person, place, time, and situation. AFFECT: pleasant, lucid thought and speech. ENT: Ears: EACs clear, normal epithelium.  TMs with good light reflex and landmarks bilaterally.  Eyes: no injection, icteris, swelling, or exudate.  EOMI, PERRLA. Nose: no drainage or turbinate edema/swelling.  No injection or focal lesion.  Mouth: lips without lesion/swelling.  Oral mucosa pink and moist.  Dentition intact and without obvious caries or gingival swelling.  Oropharynx without erythema, exudate, or swelling.  Neck: supple/nontender.  No LAD, mass, or TM.  Carotid pulses 2+ bilaterally, without bruits. CV: RRR, no m/r/g.   LUNGS: CTA bilat, nonlabored resps, good aeration in all lung fields. ABD: soft, NT, ND, BS normal.  No hepatospenomegaly or mass.  No bruits. EXT: no clubbing, cyanosis, or edema.  Musculoskeletal: no joint swelling, erythema, warmth, or tenderness.  ROM of all joints intact. Neck ROM intact, a bit  stiff and mild pain with ROM, mild diffuse cervical and upper trap mm tenderness.  Spurling's neg. Skin - no sores or suspicious lesions or rashes or color changes   Pertinent labs:  Lab Results  Component Value Date   TSH 1.640 06/03/2018   Lab Results  Component Value Date   WBC 4.4 06/03/2018   HGB 14.5 06/03/2018   HCT 41.8 06/03/2018   MCV 90 06/03/2018   PLT 225 06/03/2018   Lab Results  Component Value Date   CREATININE 0.83 10/07/2018   BUN 19 10/07/2018   NA 140 10/07/2018   K 3.6 10/07/2018   CL 105 10/07/2018   CO2 30 10/07/2018   Lab Results  Component Value Date   ALT 13 06/03/2018   AST 15 06/03/2018   ALKPHOS 58 06/03/2018   BILITOT 0.9 06/03/2018   Lab Results  Component Value Date   CHOL 167 06/03/2018   Lab Results  Component Value Date   HDL 43 06/03/2018   Lab Results  Component Value Date   LDLCALC 97 06/03/2018   Lab Results  Component Value Date   TRIG 136 06/03/2018   Lab Results  Component Value Date   CHOLHDL 3.9 06/03/2018   No results found for: HGBA1C  ASSESSMENT AND PLAN:   1) Acute on chronic neck pain: she has a combination of spondylosis-related pain and myofascial pain. No imaging indicated at this time. Discussed PT but pt declined referral->wants to just do some home PT exercises that she recalls from PT in the past.  Continue heat, start TENS unit she has at home. Stop excedrin. Start meloxicam 15mg  qd.  2) HTN: The current medical regimen is effective;  continue present plan and medications. BMET today, med RF today.  3) Health maintenance exam: Reviewed age and gender appropriate health maintenance issues (prudent diet, regular exercise, health risks of tobacco and excessive alcohol, use of seatbelts, fire alarms in home, use of sunscreen).  Also reviewed age and gender appropriate health screening as well as vaccine recommendations. Vaccines: flu and all other appropriate vaccines UTD. Labs:  Fasting HP  today. Cervical ca screening: hx of hysterectomy for non-malignant reason.  Not seeing GYN  anymore. No pap indicated. Breast ca screening: most recent mammogram in EMR 08/2016-normal.  Ordered today. Colon ca screening: due for initial screening->prefers cologuard so we'll get this today.   An After Visit Summary was printed and given to the patient.  FOLLOW UP:  Return in about 6 months (around 05/02/2020) for routine chronic illness f/u.  Signed:  Crissie Sickles, MD           11/03/2019

## 2019-11-03 NOTE — Patient Instructions (Signed)
Health Maintenance, Female Adopting a healthy lifestyle and getting preventive care are important in promoting health and wellness. Ask your health care provider about:  The right schedule for you to have regular tests and exams.  Things you can do on your own to prevent diseases and keep yourself healthy. What should I know about diet, weight, and exercise? Eat a healthy diet   Eat a diet that includes plenty of vegetables, fruits, low-fat dairy products, and lean protein.  Do not eat a lot of foods that are high in solid fats, added sugars, or sodium. Maintain a healthy weight Body mass index (BMI) is used to identify weight problems. It estimates body fat based on height and weight. Your health care provider can help determine your BMI and help you achieve or maintain a healthy weight. Get regular exercise Get regular exercise. This is one of the most important things you can do for your health. Most adults should:  Exercise for at least 150 minutes each week. The exercise should increase your heart rate and make you sweat (moderate-intensity exercise).  Do strengthening exercises at least twice a week. This is in addition to the moderate-intensity exercise.  Spend less time sitting. Even light physical activity can be beneficial. Watch cholesterol and blood lipids Have your blood tested for lipids and cholesterol at 52 years of age, then have this test every 5 years. Have your cholesterol levels checked more often if:  Your lipid or cholesterol levels are high.  You are older than 52 years of age.  You are at high risk for heart disease. What should I know about cancer screening? Depending on your health history and family history, you may need to have cancer screening at various ages. This may include screening for:  Breast cancer.  Cervical cancer.  Colorectal cancer.  Skin cancer.  Lung cancer. What should I know about heart disease, diabetes, and high blood  pressure? Blood pressure and heart disease  High blood pressure causes heart disease and increases the risk of stroke. This is more likely to develop in people who have high blood pressure readings, are of African descent, or are overweight.  Have your blood pressure checked: ? Every 3-5 years if you are 18-39 years of age. ? Every year if you are 40 years old or older. Diabetes Have regular diabetes screenings. This checks your fasting blood sugar level. Have the screening done:  Once every three years after age 40 if you are at a normal weight and have a low risk for diabetes.  More often and at a younger age if you are overweight or have a high risk for diabetes. What should I know about preventing infection? Hepatitis B If you have a higher risk for hepatitis B, you should be screened for this virus. Talk with your health care provider to find out if you are at risk for hepatitis B infection. Hepatitis C Testing is recommended for:  Everyone born from 1945 through 1965.  Anyone with known risk factors for hepatitis C. Sexually transmitted infections (STIs)  Get screened for STIs, including gonorrhea and chlamydia, if: ? You are sexually active and are younger than 52 years of age. ? You are older than 52 years of age and your health care provider tells you that you are at risk for this type of infection. ? Your sexual activity has changed since you were last screened, and you are at increased risk for chlamydia or gonorrhea. Ask your health care provider if   you are at risk.  Ask your health care provider about whether you are at high risk for HIV. Your health care provider may recommend a prescription medicine to help prevent HIV infection. If you choose to take medicine to prevent HIV, you should first get tested for HIV. You should then be tested every 3 months for as long as you are taking the medicine. Pregnancy  If you are about to stop having your period (premenopausal) and  you may become pregnant, seek counseling before you get pregnant.  Take 400 to 800 micrograms (mcg) of folic acid every day if you become pregnant.  Ask for birth control (contraception) if you want to prevent pregnancy. Osteoporosis and menopause Osteoporosis is a disease in which the bones lose minerals and strength with aging. This can result in bone fractures. If you are 65 years old or older, or if you are at risk for osteoporosis and fractures, ask your health care provider if you should:  Be screened for bone loss.  Take a calcium or vitamin D supplement to lower your risk of fractures.  Be given hormone replacement therapy (HRT) to treat symptoms of menopause. Follow these instructions at home: Lifestyle  Do not use any products that contain nicotine or tobacco, such as cigarettes, e-cigarettes, and chewing tobacco. If you need help quitting, ask your health care provider.  Do not use street drugs.  Do not share needles.  Ask your health care provider for help if you need support or information about quitting drugs. Alcohol use  Do not drink alcohol if: ? Your health care provider tells you not to drink. ? You are pregnant, may be pregnant, or are planning to become pregnant.  If you drink alcohol: ? Limit how much you use to 0-1 drink a day. ? Limit intake if you are breastfeeding.  Be aware of how much alcohol is in your drink. In the [place] one drink equals one 12 oz bottle of beer (355 mL), one 5 oz glass of wine (148 mL), or one 1 oz glass of hard liquor (44 mL). General instructions  Schedule regular health, dental, and eye exams.  Stay current with your vaccines.  Tell your health care provider if: ? You often feel depressed. ? You have ever been abused or do not feel safe at home. Summary  Adopting a healthy lifestyle and getting preventive care are important in promoting health and wellness.  Follow your health care provider's instructions about healthy  diet, exercising, and getting tested or screened for diseases.  Follow your health care provider's instructions on monitoring your cholesterol and blood pressure. This information is not intended to replace advice given to you by your health care provider. Make sure you discuss any questions you have with your health care provider. Document Released: 06/10/2011 Document Revised: 11/18/2018 Document Reviewed: 11/18/2018 Elsevier Patient Education  2020 [employer]  

## 2019-11-18 ENCOUNTER — Other Ambulatory Visit: Payer: Self-pay

## 2019-11-18 ENCOUNTER — Ambulatory Visit (INDEPENDENT_AMBULATORY_CARE_PROVIDER_SITE_OTHER): Payer: No Typology Code available for payment source | Admitting: Family Medicine

## 2019-11-18 ENCOUNTER — Encounter: Payer: Self-pay | Admitting: Family Medicine

## 2019-11-18 DIAGNOSIS — R7301 Impaired fasting glucose: Secondary | ICD-10-CM

## 2019-11-18 DIAGNOSIS — H65112 Acute and subacute allergic otitis media (mucoid) (sanguinous) (serous), left ear: Secondary | ICD-10-CM

## 2019-11-18 DIAGNOSIS — H9202 Otalgia, left ear: Secondary | ICD-10-CM | POA: Diagnosis not present

## 2019-11-18 DIAGNOSIS — J069 Acute upper respiratory infection, unspecified: Secondary | ICD-10-CM

## 2019-11-18 LAB — GLUCOSE, RANDOM: Glucose, Bld: 90 mg/dL (ref 70–99)

## 2019-11-18 LAB — HEMOGLOBIN A1C: Hgb A1c MFr Bld: 5.9 % (ref 4.6–6.5)

## 2019-11-18 NOTE — Progress Notes (Signed)
Virtual Visit via Video Note  I connected with pt on 11/18/19 at  3:30 PM EST by a video enabled telemedicine application and verified that I am speaking with the correct person using two identifiers.  Location patient: home Location provider:work or home office Persons participating in the virtual visit: patient, provider  I discussed the limitations of evaluation and management by telemedicine and the availability of in person appointments. The patient expressed understanding and agreed to proceed.  Telemedicine visit is a necessity given the COVID-19 restrictions in place at the current time.  HPI: 53 y/o WF being seen today for runny nose. Onset couple days ago, stuffy nose/runny nose, L ear starting to hurt (sharp/shooting and brief, not constant.  "Feels like it does when i've had infection in ear in the past.  I've had some severe ear infections in the past when I waited too long to get treated", + mild facial pressure/pain. Just a bit of coughing.  No signif PND.  No fever. No SOB. No loss of taste or smell. Sudafed and flonase and zyrtec.  Neg covid test about 2 wks ago b/c she was exposed to her dad--she did not have sx's.    ROS: See pertinent positives and negatives per HPI.  Past Medical History:  Diagnosis Date  . Acute urinary retention 2018   Once after hysterectomy surgery 2012, another spontaneous occurrence 11/2017--Dr. Jeffie Pollock started her on flomax 12/2017--much improved as of 12/22/2017 and 03/2018 urol f/u.  Marland Kitchen ALLERGIC RHINITIS 03/31/2010   Qualifier: Diagnosis of  By: Alveta Heimlich MD, Cornelia Copa    . Asthma   . Bilateral external ear infections   . Cervical spondylosis 2011   w/out myelopathy; nerve block injections done 08/2010 and 10/2010 (Dr. Letta Pate)  . Fibroids, submucosal    u/s 07/2010  . Heart murmur    Pt reports this is present intermittently, says past ECHO normal.  . Menorrhagia    Endo ablation 02/14/11  . Migraine    plus tension HA's; some occipital HA's  associated with her cervicalgia  . Panic attacks   . Plantar fasciitis   . Shingles    x 2.  Once L rib cage, once L ear.    Past Surgical History:  Procedure Laterality Date  . ABDOMINAL HYSTERECTOMY    . APPENDECTOMY  1979  . CESAREAN SECTION  02/2004  . ENDOMETRIAL ABLATION  02/14/11  . ENDOMETRIAL BIOPSY  12/2010   Benign, with exogenous hormone effect.  No hyperplasia.  Marland Kitchen FINGER SURGERY  1982   Ring finger right hand; no metal/screws in finger.  . ROBOTIC ASSISTED LAPAROSCOPIC VAGINAL HYSTERECTOMY WITH FIBROID REMOVAL  02/17/12   Benign leiomyomata and benign cervix on path    Family History  Problem Relation Age of Onset  . Heart disease Father        CABG in his 26s  . Diabetes Father   . Asthma Brother   . Cancer Paternal Grandmother        Breast; detected in her 31's at the earliest     Current Outpatient Medications:  .  Aspirin-Acetaminophen-Caffeine (EXCEDRIN PO), Take by mouth every morning., Disp: , Rfl:  .  cetirizine (ZYRTEC) 10 MG tablet, Take 10 mg by mouth daily., Disp: , Rfl:  .  fluticasone (FLONASE) 50 MCG/ACT nasal spray, Place 1 spray into both nostrils 2 (two) times daily., Disp: 16 g, Rfl: 3 .  lisinopril (ZESTRIL) 10 MG tablet, Take 1 tablet (10 mg total) by mouth daily., Disp: 90 tablet,  Rfl: 3 .  meloxicam (MOBIC) 15 MG tablet, Take 1 tablet (15 mg total) by mouth daily., Disp: 30 tablet, Rfl: 0 .  Pseudoephedrine HCl (SUDAFED PO), Take by mouth as needed., Disp: , Rfl:  .  cyclobenzaprine (FLEXERIL) 10 MG tablet, , Disp: , Rfl:   EXAM:  VITALS per patient if applicable: LMP [id number]    GENERAL: alert, oriented, appears well and in no acute distress  HEENT: atraumatic, conjunttiva clear, no obvious abnormalities on inspection of external nose and ears  NECK: normal movements of the head and neck  LUNGS: on inspection no signs of respiratory distress, breathing rate appears normal, no obvious gross SOB, gasping or wheezing  CV: no  obvious cyanosis  MS: moves all visible extremities without noticeable abnormality  PSYCH/NEURO: pleasant and cooperative, no obvious depression or anxiety, speech and thought processing grossly intact  LABS: none today    Chemistry      Component Value Date/Time   NA 139 11/03/2019 1035   NA 141 06/03/2018 0825   K 4.0 11/03/2019 1035   CL 105 11/03/2019 1035   CO2 24 11/03/2019 1035   BUN 16 11/03/2019 1035   BUN 16 06/03/2018 0825   CREATININE 0.88 11/03/2019 1035      Component Value Date/Time   CALCIUM 9.5 11/03/2019 1035   ALKPHOS 58 11/03/2019 1035   AST 16 11/03/2019 1035   ALT 14 11/03/2019 1035   BILITOT 1.2 11/03/2019 1035   BILITOT 0.9 06/03/2018 0825      ASSESSMENT AND PLAN:  Discussed the following assessment and plan:  Viral URI suspected. Left otalgia, hx of recurrent ear infections that have felt same as this one. Will rx amoxil 875mg  bid x 7d. Cautious use of afrin for nasal congestion while monitoring bp. Continue all other current meds. Signs/symptoms to call or return for were reviewed and pt expressed understanding.  -we discussed possible serious and likely etiologies, options for evaluation and workup, limitations of telemedicine visit vs in person visit, treatment, treatment risks and precautions. Pt prefers to treat via telemedicine empirically rather then risking or undertaking an in person visit at this moment. Patient agrees to seek prompt in person care if worsening, new symptoms arise, or if is not improving with treatment.   I discussed the assessment and treatment plan with the patient. The patient was provided an opportunity to ask questions and all were answered. The patient agreed with the plan and demonstrated an understanding of the instructions.   The patient was advised to call back or seek an in-person evaluation if the symptoms worsen or if the condition fails to improve as anticipated.  F/u: prn  Signed:  Crissie Sickles, MD            11/18/2019

## 2019-11-19 ENCOUNTER — Telehealth: Payer: Self-pay | Admitting: Family Medicine

## 2019-11-19 MED ORDER — AMOXICILLIN 875 MG PO TABS: 875.0000 mg | ORAL_TABLET | Freq: Two times a day (BID) | ORAL | 0 refills | Status: AC

## 2019-11-19 MED ORDER — AMOXICILLIN 875 MG PO TABS: 875.0000 mg | ORAL_TABLET | Freq: Two times a day (BID) | ORAL | 0 refills | Status: DC

## 2019-11-19 MED FILL — AMOXICILLIN 875 MG TABS: 875 | 10 days supply | Qty: 20 | Fill #0

## 2019-11-19 NOTE — Telephone Encounter (Signed)
Pt had visit yesterday but Rx was not sent. Per PCP note, Amoxicillin 875mg  bid x 7d.  Please advise, thanks.

## 2019-11-19 NOTE — Telephone Encounter (Signed)
Rx for [facility] cancelled. Pt advised regarding medication.

## 2019-11-19 NOTE — Telephone Encounter (Signed)
My mistake. Pls extend my apologies. I sent the rx in just now. HOWEVER, I SENT [facility] TO [facility] B/C THIS WAS THE PHARMACY THAT CAME UP!! PLS CANCEL [facility] AND i'LL RESEND RX TO [facility] ON [address]

## 2019-11-19 NOTE — Telephone Encounter (Signed)
Patient called to check on Amoxicillin Rx from recent OV. Please send Rx to [facility].

## 2019-11-20 DIAGNOSIS — Z1211 Encounter for screening for malignant neoplasm of colon: Secondary | ICD-10-CM

## 2019-11-20 HISTORY — DX: Encounter for screening for malignant neoplasm of colon: Z12.11

## 2019-11-25 LAB — COLOGUARD: Cologuard: NEGATIVE

## 2019-11-26 ENCOUNTER — Other Ambulatory Visit: Payer: Self-pay

## 2019-11-26 DIAGNOSIS — Z1211 Encounter for screening for malignant neoplasm of colon: Secondary | ICD-10-CM

## 2019-11-26 NOTE — Addendum Note (Signed)
Addended by: Gerilyn Nestle on: 11/26/2019 10:04 AM   Modules accepted: Orders

## 2019-11-29 ENCOUNTER — Encounter: Payer: Self-pay | Admitting: Family Medicine

## 2019-12-28 ENCOUNTER — Ambulatory Visit: Payer: No Typology Code available for payment source

## 2020-01-27 ENCOUNTER — Ambulatory Visit: Payer: No Typology Code available for payment source

## 2020-02-01 MED FILL — LISINOPRIL 10 MG TABS: 10 | 90 days supply | Qty: 90 | Fill #1

## 2020-02-28 ENCOUNTER — Ambulatory Visit
Admission: RE | Admit: 2020-02-28 | Discharge: 2020-02-28 | Disposition: A | Discharge status: Home / Self Care | Payer: No Typology Code available for payment source | Source: Ambulatory Visit | Attending: Family Medicine | Admitting: Family Medicine

## 2020-02-28 ENCOUNTER — Other Ambulatory Visit: Payer: Self-pay

## 2020-02-28 ENCOUNTER — Other Ambulatory Visit: Payer: Self-pay | Admitting: Family Medicine

## 2020-02-28 DIAGNOSIS — Z1231 Encounter for screening mammogram for malignant neoplasm of breast: Secondary | ICD-10-CM

## 2020-05-01 MED FILL — LISINOPRIL 10 MG TABS: 10 | 90 days supply | Qty: 90 | Fill #2

## 2020-05-03 ENCOUNTER — Ambulatory Visit: Payer: No Typology Code available for payment source | Admitting: Family Medicine

## 2020-05-25 ENCOUNTER — Other Ambulatory Visit: Payer: Self-pay

## 2020-05-25 ENCOUNTER — Encounter: Payer: Self-pay | Admitting: Family Medicine

## 2020-05-25 ENCOUNTER — Ambulatory Visit (INDEPENDENT_AMBULATORY_CARE_PROVIDER_SITE_OTHER): Payer: No Typology Code available for payment source | Admitting: Family Medicine

## 2020-05-25 VITALS — BP 117/67 | HR 66 | Temp 97.7°F | Resp 16 | Ht 66.0 in | Wt 160.6 lb

## 2020-05-25 DIAGNOSIS — R7303 Prediabetes: Secondary | ICD-10-CM

## 2020-05-25 DIAGNOSIS — I10 Essential (primary) hypertension: Secondary | ICD-10-CM | POA: Diagnosis not present

## 2020-05-25 NOTE — Progress Notes (Signed)
OFFICE VISIT  05/25/2020   CC:  Chief Complaint  Patient presents with  . Follow-up    RCI, pt is not fasting   HPI:    Patient is a 54 y.o. Caucasian female who presents for 7 mo f/u HTN and prediabetes.Marland Kitchen Has hx of chronic neck pain.  A/P as of last visit: "1) Acute on chronic neck pain: she has a combination of spondylosis-related pain and myofascial pain. No imaging indicated at this time. Discussed PT but pt declined referral->wants to just do some home PT exercises that she recalls from PT in the past.  Continue heat, start TENS unit she has at home. Stop excedrin. Start meloxicam 15mg  qd.  2) HTN: The current medical regimen is effective;  continue present plan and medications. BMET today, med RF today.  3) Health maintenance exam: Reviewed age and gender appropriate health maintenance issues (prudent diet, regular exercise, health risks of tobacco and excessive alcohol, use of seatbelts, fire alarms in home, use of sunscreen).  Also reviewed age and gender appropriate health screening as well as vaccine recommendations. Vaccines: flu and all other appropriate vaccines UTD. Labs:  Fasting HP today. Cervical ca screening: hx of hysterectomy for non-malignant reason.  Not seeing GYN anymore. No pap indicated. Breast ca screening: most recent mammogram in EMR 08/2016-normal.  Ordered today. Colon ca screening: due for initial screening->prefers cologuard so we'll get this today."  INTERIM HX: Exercise: runs.   Diet: trying to eat more chicken, avoid red meat more. Tries to limit sodium.  Home bp's 115/70s avg.  Highest 128 syst. HR 60-70s.  ROS: no fevers, no CP, no SOB, no wheezing, no cough, no dizziness, no HAs, no rashes, no melena/hematochezia.  No polyuria or polydipsia.  No myalgias or arthralgias.  No focal weakness, paresthesias, or tremors.  No acute vision or hearing abnormalities. No n/v/d or abd pain.  No palpitations.     Past Medical History:   Diagnosis Date  . Acute urinary retention 2018   Once after hysterectomy surgery 2012, another spontaneous occurrence 11/2017--Dr. Jeffie Pollock started her on flomax 12/2017--much improved as of 12/22/2017 and 03/2018 urol f/u.  Marland Kitchen ALLERGIC RHINITIS 03/31/2010   Qualifier: Diagnosis of  By: Alveta Heimlich MD, Cornelia Copa    . Asthma   . Bilateral external ear infections   . Cervical spondylosis 2011   w/out myelopathy; nerve block injections done 08/2010 and 10/2010 (Dr. Letta Pate)  . Colon cancer screening 11/20/2019   cologuard neg.  Rpt 3 yrs  . Fibroids, submucosal    u/s 07/2010  . Heart murmur    Pt reports this is present intermittently, says past ECHO normal.  . Menorrhagia    Endo ablation 02/14/11  . Migraine    plus tension HA's; some occipital HA's associated with her cervicalgia  . Panic attacks   . Plantar fasciitis   . Shingles    x 2.  Once L rib cage, once L ear.    Past Surgical History:  Procedure Laterality Date  . ABDOMINAL HYSTERECTOMY    . APPENDECTOMY  1979  . CESAREAN SECTION  02/2004  . ENDOMETRIAL ABLATION  02/14/11  . ENDOMETRIAL BIOPSY  12/2010   Benign, with exogenous hormone effect.  No hyperplasia.  Marland Kitchen FINGER SURGERY  1982   Ring finger right hand; no metal/screws in finger.  . ROBOTIC ASSISTED LAPAROSCOPIC VAGINAL HYSTERECTOMY WITH FIBROID REMOVAL  02/17/12   Benign leiomyomata and benign cervix on path    Outpatient Medications Prior to Visit  Medication  Sig Dispense Refill  . Aspirin-Acetaminophen-Caffeine (EXCEDRIN PO) Take by mouth every morning.    . cetirizine (ZYRTEC) 10 MG tablet Take 10 mg by mouth daily.    Marland Kitchen lisinopril (ZESTRIL) 10 MG tablet Take 1 tablet (10 mg total) by mouth daily. 90 tablet 3  . Pseudoephedrine HCl (SUDAFED PO) Take by mouth as needed.    . cyclobenzaprine (FLEXERIL) 10 MG tablet  (Patient not taking: Reported on 05/25/2020)    . fluticasone (FLONASE) 50 MCG/ACT nasal spray Place 1 spray into both nostrils 2 (two) times daily. (Patient not  taking: Reported on 05/25/2020) 16 g 3  . meloxicam (MOBIC) 15 MG tablet Take 1 tablet (15 mg total) by mouth daily. (Patient not taking: Reported on 05/25/2020) 30 tablet 0   No facility-administered medications prior to visit.    No Known Allergies  ROS As per HPI  PE: Vitals with BMI 05/25/2020 11/03/2019 12/17/2018  Height 5\' 6"  5\' 6"  5\' 6"   Weight 160 lbs 10 oz 161 lbs 13 oz 160 lbs  BMI 25.93 40.98 11.91  Systolic 478 295 621  Diastolic 67 83 88  Pulse 66 80 67    Gen: Alert, well appearing.  Patient is oriented to person, place, time, and situation. AFFECT: pleasant, lucid thought and speech. CV: RRR, no m/r/g.   LUNGS: CTA bilat, nonlabored resps, good aeration in all lung fields. EXT: no clubbing or cyanosis.  no edema.    LABS:  Lab Results  Component Value Date   TSH 0.84 11/03/2019   Lab Results  Component Value Date   WBC 5.5 11/03/2019   HGB 14.6 11/03/2019   HCT 43.5 11/03/2019   MCV 92.9 11/03/2019   PLT 243.0 11/03/2019   Lab Results  Component Value Date   CREATININE 0.88 11/03/2019   BUN 16 11/03/2019   NA 139 11/03/2019   K 4.0 11/03/2019   CL 105 11/03/2019   CO2 24 11/03/2019   Lab Results  Component Value Date   ALT 14 11/03/2019   AST 16 11/03/2019   ALKPHOS 58 11/03/2019   BILITOT 1.2 11/03/2019   Lab Results  Component Value Date   CHOL 182 11/03/2019   Lab Results  Component Value Date   HDL 51.60 11/03/2019   Lab Results  Component Value Date   LDLCALC 111 (H) 11/03/2019   Lab Results  Component Value Date   TRIG 97.0 11/03/2019   Lab Results  Component Value Date   CHOLHDL 4 11/03/2019   Lab Results  Component Value Date   HGBA1C 5.9 11/18/2019    IMPRESSION AND PLAN:  1) HTN: The current medical regimen is effective;  continue present plan and medications. Lytes/cr today.  2) Prediabetes: diet fair, exercise good. Random glucose today as well as Hba1c today.  An After Visit Summary was printed and given  to the patient.  FOLLOW UP: Return in about 6 months (around 11/24/2020) for annual CPE (fasting).  Signed:  Crissie Sickles, MD           05/25/2020

## 2020-05-26 LAB — BASIC METABOLIC PANEL
BUN: 19 mg/dL (ref 6–23)
CO2: 27 mEq/L (ref 19–32)
Calcium: 9.1 mg/dL (ref 8.4–10.5)
Chloride: 105 mEq/L (ref 96–112)
Creatinine, Ser: 0.91 mg/dL (ref 0.40–1.20)
GFR: 64.72 mL/min (ref >60.00)
Glucose, Bld: 132 mg/dL — ABNORMAL HIGH (ref 70–99)
Potassium: 3.5 mEq/L (ref 3.5–5.1)
Sodium: 139 mEq/L (ref 135–145)

## 2020-05-26 LAB — HEMOGLOBIN A1C: Hgb A1c MFr Bld: 5.8 % (ref 4.6–6.5)

## 2020-08-01 MED FILL — LISINOPRIL 10 MG TABS: 10 | 90 days supply | Qty: 90 | Fill #3

## 2020-10-30 ENCOUNTER — Other Ambulatory Visit: Payer: Self-pay | Admitting: Family Medicine

## 2020-10-30 MED FILL — LISINOPRIL 10 MG TABS: 10 | 30 days supply | Qty: 30 | Fill #0

## 2020-11-23 ENCOUNTER — Other Ambulatory Visit: Payer: Self-pay

## 2020-11-24 ENCOUNTER — Ambulatory Visit (INDEPENDENT_AMBULATORY_CARE_PROVIDER_SITE_OTHER): Payer: No Typology Code available for payment source | Admitting: Family Medicine

## 2020-11-24 ENCOUNTER — Other Ambulatory Visit: Payer: Self-pay | Admitting: Family Medicine

## 2020-11-24 ENCOUNTER — Encounter: Payer: Self-pay | Admitting: Family Medicine

## 2020-11-24 VITALS — BP 125/75 | HR 66 | Temp 98.0°F | Resp 16 | Wt 164.6 lb

## 2020-11-24 DIAGNOSIS — Z1231 Encounter for screening mammogram for malignant neoplasm of breast: Secondary | ICD-10-CM | POA: Diagnosis not present

## 2020-11-24 DIAGNOSIS — F331 Major depressive disorder, recurrent, moderate: Secondary | ICD-10-CM | POA: Diagnosis not present

## 2020-11-24 DIAGNOSIS — Z0000 Encounter for general adult medical examination without abnormal findings: Secondary | ICD-10-CM | POA: Diagnosis not present

## 2020-11-24 DIAGNOSIS — I10 Essential (primary) hypertension: Secondary | ICD-10-CM

## 2020-11-24 DIAGNOSIS — R7301 Impaired fasting glucose: Secondary | ICD-10-CM

## 2020-11-24 MED ORDER — CITALOPRAM HYDROBROMIDE 20 MG PO TABS: 20.0000 mg | ORAL_TABLET | Freq: Every day | ORAL | 1 refills | Status: DC

## 2020-11-24 MED ORDER — LISINOPRIL 10 MG PO TABS: 10.0000 mg | ORAL_TABLET | Freq: Every day | ORAL | 3 refills | Status: DC

## 2020-11-24 MED FILL — LISINOPRIL 10 MG TABS: 10 | 90 days supply | Qty: 90 | Fill #0

## 2020-11-24 MED FILL — CITALOPRAM HBR 20 MG TABLET: 20 | 30 days supply | Qty: 30 | Fill #0

## 2020-11-24 NOTE — Patient Instructions (Signed)
Health Maintenance, Female Adopting a healthy lifestyle and getting preventive care are important in promoting health and wellness. Ask your health care provider about:  The right schedule for you to have regular tests and exams.  Things you can do on your own to prevent diseases and keep yourself healthy. What should I know about diet, weight, and exercise? Eat a healthy diet   Eat a diet that includes plenty of vegetables, fruits, low-fat dairy products, and lean protein.  Do not eat a lot of foods that are high in solid fats, added sugars, or sodium. Maintain a healthy weight Body mass index (BMI) is used to identify weight problems. It estimates body fat based on height and weight. Your health care provider can help determine your BMI and help you achieve or maintain a healthy weight. Get regular exercise Get regular exercise. This is one of the most important things you can do for your health. Most adults should:  Exercise for at least 150 minutes each week. The exercise should increase your heart rate and make you sweat (moderate-intensity exercise).  Do strengthening exercises at least twice a week. This is in addition to the moderate-intensity exercise.  Spend less time sitting. Even light physical activity can be beneficial. Watch cholesterol and blood lipids Have your blood tested for lipids and cholesterol at 53 years of age, then have this test every 5 years. Have your cholesterol levels checked more often if:  Your lipid or cholesterol levels are high.  You are older than 53 years of age.  You are at high risk for heart disease. What should I know about cancer screening? Depending on your health history and family history, you may need to have cancer screening at various ages. This may include screening for:  Breast cancer.  Cervical cancer.  Colorectal cancer.  Skin cancer.  Lung cancer. What should I know about heart disease, diabetes, and high blood  pressure? Blood pressure and heart disease  High blood pressure causes heart disease and increases the risk of stroke. This is more likely to develop in people who have high blood pressure readings, are of African descent, or are overweight.  Have your blood pressure checked: ? Every 3-5 years if you are 18-39 years of age. ? Every year if you are 40 years old or older. Diabetes Have regular diabetes screenings. This checks your fasting blood sugar level. Have the screening done:  Once every three years after age 40 if you are at a normal weight and have a low risk for diabetes.  More often and at a younger age if you are overweight or have a high risk for diabetes. What should I know about preventing infection? Hepatitis B If you have a higher risk for hepatitis B, you should be screened for this virus. Talk with your health care provider to find out if you are at risk for hepatitis B infection. Hepatitis C Testing is recommended for:  Everyone born from 1945 through 1965.  Anyone with known risk factors for hepatitis C. Sexually transmitted infections (STIs)  Get screened for STIs, including gonorrhea and chlamydia, if: ? You are sexually active and are younger than 53 years of age. ? You are older than 53 years of age and your health care provider tells you that you are at risk for this type of infection. ? Your sexual activity has changed since you were last screened, and you are at increased risk for chlamydia or gonorrhea. Ask your health care provider if   you are at risk.  Ask your health care provider about whether you are at high risk for HIV. Your health care provider may recommend a prescription medicine to help prevent HIV infection. If you choose to take medicine to prevent HIV, you should first get tested for HIV. You should then be tested every 3 months for as long as you are taking the medicine. Pregnancy  If you are about to stop having your period (premenopausal) and  you may become pregnant, seek counseling before you get pregnant.  Take 400 to 800 micrograms (mcg) of folic acid every day if you become pregnant.  Ask for birth control (contraception) if you want to prevent pregnancy. Osteoporosis and menopause Osteoporosis is a disease in which the bones lose minerals and strength with aging. This can result in bone fractures. If you are 65 years old or older, or if you are at risk for osteoporosis and fractures, ask your health care provider if you should:  Be screened for bone loss.  Take a calcium or vitamin D supplement to lower your risk of fractures.  Be given hormone replacement therapy (HRT) to treat symptoms of menopause. Follow these instructions at home: Lifestyle  Do not use any products that contain nicotine or tobacco, such as cigarettes, e-cigarettes, and chewing tobacco. If you need help quitting, ask your health care provider.  Do not use street drugs.  Do not share needles.  Ask your health care provider for help if you need support or information about quitting drugs. Alcohol use  Do not drink alcohol if: ? Your health care provider tells you not to drink. ? You are pregnant, may be pregnant, or are planning to become pregnant.  If you drink alcohol: ? Limit how much you use to 0-1 drink a day. ? Limit intake if you are breastfeeding.  Be aware of how much alcohol is in your drink. In the [place] one drink equals one 12 oz bottle of beer (355 mL), one 5 oz glass of wine (148 mL), or one 1 oz glass of hard liquor (44 mL). General instructions  Schedule regular health, dental, and eye exams.  Stay current with your vaccines.  Tell your health care provider if: ? You often feel depressed. ? You have ever been abused or do not feel safe at home. Summary  Adopting a healthy lifestyle and getting preventive care are important in promoting health and wellness.  Follow your health care provider's instructions about healthy  diet, exercising, and getting tested or screened for diseases.  Follow your health care provider's instructions on monitoring your cholesterol and blood pressure. This information is not intended to replace advice given to you by your health care provider. Make sure you discuss any questions you have with your health care provider. Document Revised: 11/18/2018 Document Reviewed: 11/18/2018 Elsevier Patient Education  2020 [employer]  

## 2020-11-24 NOTE — Progress Notes (Signed)
Office Note 11/24/2020  CC:  Chief Complaint  Patient presents with  . Follow-up    [facility], pt is     HPI:  Sara Burton is a 54 y.o. White female who is here for annual health maintenance and f/u HTN.  HTN: occ home bp check shows consistently 120/75. Compliant with lisin 10mg  qd. Elliptical machine regularly.  Very stressed and feeling depressed.  Many months now and getting more persistent/severe.  Just wants to sleep but can't sleep well.  +Crying spells.  Mild anhedonia.  Appetite not a prob. Poor energy level.  Concentration is OK.  Some irritability.  No panic attacks.  Having some neck pain and ha's. No alc.  No SI or HI.  Still working fine Optometrist, Engineer, maintenance (IT)). Took citalopram  2016, no adverse effect recalled. Paxil in remote past--"zombie".  Past Medical History:  Diagnosis Date  . Acute urinary retention 2018   Once after hysterectomy surgery 2012, another spontaneous occurrence 11/2017--Dr. Jeffie Pollock started her on flomax 12/2017--much improved as of 12/22/2017 and 03/2018 urol f/u.  Marland Kitchen ALLERGIC RHINITIS 03/31/2010   Qualifier: Diagnosis of  By: Alveta Heimlich MD, Cornelia Copa    . Asthma   . Bilateral external ear infections   . Cervical spondylosis 2011   w/out myelopathy; nerve block injections done 08/2010 and 10/2010 (Dr. Letta Pate)  . Colon cancer screening 11/20/2019   cologuard neg.  Rpt 3 yrs  . Fibroids, submucosal    u/s 07/2010  . Heart murmur    Pt reports this is present intermittently, says past ECHO normal.  . Menorrhagia    Endo ablation 02/14/11  . Migraine    plus tension HA's; some occipital HA's associated with her cervicalgia  . Panic attacks   . Plantar fasciitis   . Shingles    x 2.  Once L rib cage, once L ear.    Past Surgical History:  Procedure Laterality Date  . ABDOMINAL HYSTERECTOMY    . APPENDECTOMY  1979  . CESAREAN SECTION  02/2004  . ENDOMETRIAL ABLATION  02/14/11  . ENDOMETRIAL BIOPSY  12/2010   Benign, with exogenous hormone  effect.  No hyperplasia.  Marland Kitchen FINGER SURGERY  1982   Ring finger right hand; no metal/screws in finger.  . ROBOTIC ASSISTED LAPAROSCOPIC VAGINAL HYSTERECTOMY WITH FIBROID REMOVAL  02/17/12   Benign leiomyomata and benign cervix on path    Family History  Problem Relation Age of Onset  . Heart disease Father        CABG in his 54s  . Diabetes Father   . Asthma Brother   . Cancer Paternal Grandmother        Breast; detected in her 58's at the earliest  . Breast cancer Paternal Grandmother     Social History   Socioeconomic History  . Marital status: Divorced    Spouse name: Not on file  . Number of children: Not on file  . Years of education: Not on file  . Highest education level: Not on file  Occupational History  . Not on file  Tobacco Use  . Smoking status: Never Smoker  . Smokeless tobacco: Never Used  Vaping Use  . Vaping Use: Never used  Substance and Sexual Activity  . Alcohol use: No  . Drug use: No  . Sexual activity: Yes  Other Topics Concern  . Not on file  Social History Narrative   Married, one son.   Originally from [place].   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   No T/A/Ds.   Has one brother and 1 sister.   Social Determinants of Health   Financial Resource Strain: Not on file  Food Insecurity: Not on file  Transportation Needs: Not on file  Physical Activity: Not on file  Stress: Not on file  Social Connections: Not on file  Intimate Partner Violence: Not on file    Outpatient Medications Prior to Visit  Medication Sig Dispense Refill  . Aspirin-Acetaminophen-Caffeine (EXCEDRIN PO) Take by mouth every morning.    . cetirizine (ZYRTEC) 10 MG tablet Take 10 mg by mouth daily.    . Pseudoephedrine HCl (SUDAFED PO) Take by mouth as needed.    Marland Kitchen lisinopril (ZESTRIL) 10 MG tablet TAKE 1 TABLET (10 MG TOTAL) BY MOUTH DAILY. 30 tablet 0   No facility-administered medications prior to visit.    No Known  Allergies  ROS Review of Systems  Constitutional: Negative for appetite change, chills, fatigue and fever.  HENT: Negative for congestion, dental problem, ear pain and sore throat.   Eyes: Negative for discharge, redness and visual disturbance.  Respiratory: Negative for cough, chest tightness, shortness of breath and wheezing.   Cardiovascular: Negative for chest pain, palpitations and leg swelling.  Gastrointestinal: Negative for abdominal pain, blood in stool, diarrhea, nausea and vomiting.  Genitourinary: Negative for difficulty urinating, dysuria, flank pain, frequency, hematuria and urgency.  Musculoskeletal: Negative for arthralgias, back pain, joint swelling, myalgias and neck stiffness.  Skin: Negative for pallor and rash.  Neurological: Negative for dizziness, speech difficulty, weakness and headaches.  Hematological: Negative for adenopathy. Does not bruise/bleed easily.  Psychiatric/Behavioral: Positive for dysphoric mood and sleep disturbance. Negative for confusion and suicidal ideas. The patient is nervous/anxious.     PE; Vitals with BMI 11/24/2020 05/25/2020 11/03/2019  Height - 5\' 6"  5\' 6"   Weight 164 lbs 10 oz 160 lbs 10 oz 161 lbs 13 oz  BMI - 25.95 63.87  Systolic 564 332 951  Diastolic 75 67 83  Pulse 66 66 80   Exam chaperoned by Deveron Furlong, CMA.  Gen: Alert, well appearing.  Patient is oriented to person, place, time, and situation. AFFECT: pleasant, lucid thought and speech. ENT: Ears: EACs clear, normal epithelium.  TMs with good light reflex and landmarks bilaterally.  Eyes: no injection, icteris, swelling, or exudate.  EOMI, PERRLA. Nose: no drainage or turbinate edema/swelling.  No injection or focal lesion.  Mouth: lips without lesion/swelling.  Oral mucosa pink and moist.  Dentition intact and without obvious caries or gingival swelling.  Oropharynx without erythema, exudate, or swelling.  Neck: supple/nontender.  No LAD, mass, or TM.  Carotid  pulses 2+ bilaterally, without bruits. CV: RRR, no m/r/g.   LUNGS: CTA bilat, nonlabored resps, good aeration in all lung fields. ABD: soft, NT, ND, BS normal.  No hepatospenomegaly or mass.  No bruits. EXT: no clubbing, cyanosis, or edema.  Musculoskeletal: no joint swelling, erythema, warmth, or tenderness.  ROM of all joints intact. Skin - no sores or suspicious lesions or rashes or color changes   Pertinent labs:  Lab Results  Component Value Date   TSH 0.84 11/03/2019   Lab Results  Component Value Date   WBC 5.5 11/03/2019   HGB 14.6 11/03/2019   HCT 43.5 11/03/2019   MCV 92.9 11/03/2019   PLT 243.0 11/03/2019   Lab Results  Component Value Date   CREATININE 0.91 05/25/2020   BUN 19 05/25/2020   NA 139 05/25/2020   K 3.5 05/25/2020  CL 105 05/25/2020   CO2 27 05/25/2020   Lab Results  Component Value Date   ALT 14 11/03/2019   AST 16 11/03/2019   ALKPHOS 58 11/03/2019   BILITOT 1.2 11/03/2019   Lab Results  Component Value Date   CHOL 182 11/03/2019   Lab Results  Component Value Date   HDL 51.60 11/03/2019   Lab Results  Component Value Date   LDLCALC 111 (H) 11/03/2019   Lab Results  Component Value Date   TRIG 97.0 11/03/2019   Lab Results  Component Value Date   CHOLHDL 4 11/03/2019   Lab Results  Component Value Date   HGBA1C 5.8 05/25/2020   ASSESSMENT AND PLAN:   1) HTN: well controlled. Rf'd lisinopril 10mg  qd, #90, RF x 3. Lytes/cr ordered.  2) Recurrent episode of MDD. Tolerated citalopram back in 2015-2016 for anxiety/panic. Will get her back on this at 20mg  qd dosing now. She declined counseling.  3) Health maintenance exam: Reviewed age and gender appropriate health maintenance issues (prudent diet, regular exercise, health risks of tobacco and excessive alcohol, use of seatbelts, fire alarms in home, use of sunscreen).  Also reviewed age and gender appropriate health screening as well as vaccine recommendations. Vaccines:  Flu->UTD.  Covid->UTD.  Otherwise UTD. Labs: HP labs + Hba1c ordered--return to get these when fasting. Cervical ca screening: n/a due to having hysterectomy for benign dx in remote past. Breast ca screening: Normal mammogram 02/2020.  Plan rpt after 02/2021. Colon ca screening: Pt average risk for CC-->cologuard repeat 11/2022.   An After Visit Summary was printed and given to the patient.  FOLLOW UP:  Return in about 4 weeks (around 12/22/2020) for f/u dep/med.  Signed:  Crissie Sickles, MD           11/24/2020

## 2020-11-30 ENCOUNTER — Other Ambulatory Visit: Payer: Self-pay

## 2020-11-30 ENCOUNTER — Ambulatory Visit (INDEPENDENT_AMBULATORY_CARE_PROVIDER_SITE_OTHER): Payer: No Typology Code available for payment source

## 2020-11-30 DIAGNOSIS — R7301 Impaired fasting glucose: Secondary | ICD-10-CM

## 2020-11-30 DIAGNOSIS — I10 Essential (primary) hypertension: Secondary | ICD-10-CM | POA: Diagnosis not present

## 2020-12-01 LAB — COMPREHENSIVE METABOLIC PANEL
AG Ratio: 2 (calc) (ref 1.0–2.5)
ALT: 18 U/L (ref 6–29)
AST: 17 U/L (ref 10–35)
Albumin: 4.5 g/dL (ref 3.6–5.1)
Alkaline phosphatase (APISO): 61 U/L (ref 37–153)
BUN: 20 mg/dL (ref 7–25)
CO2: 25 mmol/L (ref 20–32)
Calcium: 9.5 mg/dL (ref 8.6–10.4)
Chloride: 104 mmol/L (ref 98–110)
Creat: 0.86 mg/dL (ref 0.50–1.05)
Globulin: 2.2 g/dL (calc) (ref 1.9–3.7)
Glucose, Bld: 113 mg/dL — ABNORMAL HIGH (ref 65–99)
Potassium: 4.2 mmol/L (ref 3.5–5.3)
Sodium: 140 mmol/L (ref 135–146)
Total Bilirubin: 1.1 mg/dL (ref 0.2–1.2)
Total Protein: 6.7 g/dL (ref 6.1–8.1)

## 2020-12-01 LAB — CBC WITH DIFFERENTIAL/PLATELET
Absolute Monocytes: 359 cells/uL (ref 200–950)
Basophils Absolute: 41 cells/uL (ref 0–200)
Basophils Relative: 0.9 %
Eosinophils Absolute: 129 cells/uL (ref 15–500)
Eosinophils Relative: 2.8 %
HCT: 41.2 % (ref 35.0–45.0)
Hemoglobin: 14.2 g/dL (ref 11.7–15.5)
Lymphs Abs: 1757 cells/uL (ref 850–3900)
MCH: 31.1 pg (ref 27.0–33.0)
MCHC: 34.5 g/dL (ref 32.0–36.0)
MCV: 90.4 fL (ref 80.0–100.0)
MPV: 9.8 fL (ref 7.5–12.5)
Monocytes Relative: 7.8 %
Neutro Abs: 2314 cells/uL (ref 1500–7800)
Neutrophils Relative %: 50.3 %
Platelets: 239 10*3/uL (ref 140–400)
RBC: 4.56 10*6/uL (ref 3.80–5.10)
RDW: 11.7 % (ref 11.0–15.0)
Total Lymphocyte: 38.2 %
WBC: 4.6 10*3/uL (ref 3.8–10.8)

## 2020-12-01 LAB — LIPID PANEL
Cholesterol: 176 mg/dL (ref <200)
HDL: 57 mg/dL (ref >=50)
LDL Cholesterol (Calc): 104 mg/dL (calc) — ABNORMAL HIGH
Non-HDL Cholesterol (Calc): 119 mg/dL (calc) (ref <130)
Total CHOL/HDL Ratio: 3.1 (calc) (ref <5.0)
Triglycerides: 67 mg/dL (ref <150)

## 2020-12-01 LAB — HEMOGLOBIN A1C
Hgb A1c MFr Bld: 5.7 % of total Hgb — ABNORMAL HIGH (ref <5.7)
Mean Plasma Glucose: 117 mg/dL
eAG (mmol/L): 6.5 mmol/L

## 2020-12-01 LAB — TSH: TSH: 1.24 mIU/L

## 2020-12-06 ENCOUNTER — Other Ambulatory Visit: Payer: No Typology Code available for payment source

## 2020-12-06 DIAGNOSIS — Z20822 Contact with and (suspected) exposure to covid-19: Secondary | ICD-10-CM

## 2020-12-07 LAB — SARS-COV-2, NAA 2 DAY TAT

## 2020-12-07 LAB — NOVEL CORONAVIRUS, NAA: SARS-CoV-2, NAA: NOT DETECTED

## 2020-12-09 DIAGNOSIS — E782 Mixed hyperlipidemia: Secondary | ICD-10-CM

## 2020-12-09 HISTORY — DX: Mixed hyperlipidemia: E78.2

## 2020-12-21 ENCOUNTER — Other Ambulatory Visit: Payer: Self-pay

## 2020-12-22 ENCOUNTER — Other Ambulatory Visit: Payer: Self-pay | Admitting: Family Medicine

## 2020-12-22 ENCOUNTER — Ambulatory Visit (INDEPENDENT_AMBULATORY_CARE_PROVIDER_SITE_OTHER): Payer: No Typology Code available for payment source | Admitting: Family Medicine

## 2020-12-22 ENCOUNTER — Encounter: Payer: Self-pay | Admitting: Family Medicine

## 2020-12-22 VITALS — BP 137/75 | HR 56 | Temp 98.0°F | Resp 16 | Ht 66.0 in | Wt 161.0 lb

## 2020-12-22 DIAGNOSIS — F330 Major depressive disorder, recurrent, mild: Secondary | ICD-10-CM

## 2020-12-22 MED ORDER — CITALOPRAM HYDROBROMIDE 20 MG PO TABS: 20.0000 mg | ORAL_TABLET | Freq: Every day | ORAL | 3 refills | Status: DC

## 2020-12-22 MED FILL — CITALOPRAM HBR 20 MG TABLET: 20 | 90 days supply | Qty: 90 | Fill #0

## 2020-12-22 NOTE — Progress Notes (Signed)
OFFICE VISIT  12/22/2020  CC:  Chief Complaint  Patient presents with  . Follow-up    Depression, medication   HPI:    Patient is a 55 y.o. Caucasian female who presents for 1 mo f/u recurrent MDD. A/P as of last visit: "1) HTN: well controlled. Rf'd lisinopril 10mg  qd, #90, RF x 3. Lytes/cr ordered.  2) Recurrent episode of MDD. Tolerated citalopram back in 2015-2016 for anxiety/panic. Will get her back on this at 20mg  qd dosing now. She declined counseling.  3) Health maintenance exam: Reviewed age and gender appropriate health maintenance issues (prudent diet, regular exercise, health risks of tobacco and excessive alcohol, use of seatbelts, fire alarms in home, use of sunscreen).  Also reviewed age and gender appropriate health screening as well as vaccine recommendations. Vaccines: Flu->UTD.  Covid->UTD.  Otherwise UTD. Labs: HP labs + Hba1c ordered--return to get these when fasting. Cervical ca screening: n/a due to having hysterectomy for benign dx in remote past. Breast ca screening: Normal mammogram 02/2020.  Plan rpt after 02/2021. Colon ca screening: Pt average risk for CC-->cologuard repeat 11/2022."  INTERIM HX: Labs 3 wks ago all excellent. Says she is feeling significantly improved regarding mood, anxiety level, overall sense of well-being.  No side effects from the med. Sleep initiation still a bit problematic but lots of this seems to center around some stress to do with her son's school/teachers, etc.   Past Medical History:  Diagnosis Date  . Acute urinary retention 2018   Once after hysterectomy surgery 2012, another spontaneous occurrence 11/2017--Dr. Jeffie Pollock started her on flomax 12/2017--much improved as of 12/22/2017 and 03/2018 urol f/u.  Marland Kitchen ALLERGIC RHINITIS 03/31/2010   Qualifier: Diagnosis of  By: Alveta Heimlich MD, Cornelia Copa    . Asthma   . Bilateral external ear infections   . Cervical spondylosis 2011   w/out myelopathy; nerve block injections done 08/2010 and  10/2010 (Dr. Letta Pate)  . Colon cancer screening 11/20/2019   cologuard neg.  Rpt 3 yrs  . Fibroids, submucosal    u/s 07/2010  . Heart murmur    Pt reports this is present intermittently, says past ECHO normal.  . Menorrhagia    Endo ablation 02/14/11  . Migraine    plus tension HA's; some occipital HA's associated with her cervicalgia  . Panic attacks   . Plantar fasciitis   . Shingles    x 2.  Once L rib cage, once L ear.    Past Surgical History:  Procedure Laterality Date  . ABDOMINAL HYSTERECTOMY    . APPENDECTOMY  1979  . CESAREAN SECTION  02/2004  . ENDOMETRIAL ABLATION  02/14/11  . ENDOMETRIAL BIOPSY  12/2010   Benign, with exogenous hormone effect.  No hyperplasia.  Marland Kitchen FINGER SURGERY  1982   Ring finger right hand; no metal/screws in finger.  . ROBOTIC ASSISTED LAPAROSCOPIC VAGINAL HYSTERECTOMY WITH FIBROID REMOVAL  02/17/12   Benign leiomyomata and benign cervix on path    Outpatient Medications Prior to Visit  Medication Sig Dispense Refill  . Aspirin-Acetaminophen-Caffeine (EXCEDRIN PO) Take by mouth every morning.    . cetirizine (ZYRTEC) 10 MG tablet Take 10 mg by mouth daily.    Marland Kitchen lisinopril (ZESTRIL) 10 MG tablet Take 1 tablet (10 mg total) by mouth daily. 90 tablet 3  . Pseudoephedrine HCl (SUDAFED PO) Take by mouth as needed.    . citalopram (CELEXA) 20 MG tablet Take 1 tablet (20 mg total) by mouth daily. 30 tablet 1   No facility-administered  medications prior to visit.    No Known Allergies  ROS As per HPI  PE: Vitals with BMI 12/22/2020 11/24/2020 05/25/2020  Height 5\' 6"  - 5\' 6"   Weight 161 lbs 164 lbs 10 oz 160 lbs 10 oz  BMI 26 - 56.21  Systolic 308 657 846  Diastolic 75 75 67  Pulse 56 66 66     Wt Readings from Last 2 Encounters:  12/22/20 161 lb (73 kg)  11/24/20 164 lb 9.6 oz (74.7 kg)    Gen: alert, oriented x 4, affect pleasant.  Lucid thinking and conversation noted. HEENT: PERRLA, EOMI.   Neck: no LAD, mass, or  thyromegaly. CV: RRR, no m/r/g LUNGS: CTA bilat, nonlabored. NEURO: no tremor or tics noted on observation.  Coordination intact. CN 2-12 grossly intact bilaterally, strength 5/5 in all extremeties.  No ataxia.   LABS:  Lab Results  Component Value Date   TSH 1.24 11/30/2020   Lab Results  Component Value Date   WBC 4.6 11/30/2020   HGB 14.2 11/30/2020   HCT 41.2 11/30/2020   MCV 90.4 11/30/2020   PLT 239 11/30/2020   Lab Results  Component Value Date   CREATININE 0.86 11/30/2020   BUN 20 11/30/2020   NA 140 11/30/2020   K 4.2 11/30/2020   CL 104 11/30/2020   CO2 25 11/30/2020   Lab Results  Component Value Date   ALT 18 11/30/2020   AST 17 11/30/2020   ALKPHOS 58 11/03/2019   BILITOT 1.1 11/30/2020   Lab Results  Component Value Date   CHOL 176 11/30/2020   Lab Results  Component Value Date   HDL 57 11/30/2020   Lab Results  Component Value Date   LDLCALC 104 (H) 11/30/2020   Lab Results  Component Value Date   TRIG 67 11/30/2020   Lab Results  Component Value Date   CHOLHDL 3.1 11/30/2020   Lab Results  Component Value Date   HGBA1C 5.7 (H) 11/30/2020   IMPRESSION AND PLAN:  Recurrent MDD, MUCH improved on citalopram 20mg  qd for the last month. Continue current dosing.  An After Visit Summary was printed and given to the patient.  FOLLOW UP: Return in about 3 months (around 03/22/2021) for routine chronic illness f/u.  Signed:  Crissie Sickles, MD           12/22/2020

## 2021-03-27 ENCOUNTER — Other Ambulatory Visit (HOSPITAL_COMMUNITY): Payer: Self-pay

## 2021-03-27 MED FILL — Citalopram Hydrobromide Tab 20 MG (Base Equiv): ORAL | 90 days supply | Qty: 90 | Fill #0 | Status: AC

## 2021-03-28 ENCOUNTER — Ambulatory Visit: Payer: No Typology Code available for payment source | Admitting: Family Medicine

## 2021-04-11 ENCOUNTER — Ambulatory Visit (INDEPENDENT_AMBULATORY_CARE_PROVIDER_SITE_OTHER): Payer: No Typology Code available for payment source | Admitting: Family Medicine

## 2021-04-11 ENCOUNTER — Other Ambulatory Visit (HOSPITAL_COMMUNITY): Payer: Self-pay

## 2021-04-11 ENCOUNTER — Other Ambulatory Visit: Payer: Self-pay

## 2021-04-11 ENCOUNTER — Encounter: Payer: Self-pay | Admitting: Family Medicine

## 2021-04-11 VITALS — BP 118/74 | HR 55 | Temp 98.2°F | Resp 16 | Ht 66.0 in | Wt 167.4 lb

## 2021-04-11 DIAGNOSIS — F331 Major depressive disorder, recurrent, moderate: Secondary | ICD-10-CM

## 2021-04-11 DIAGNOSIS — F411 Generalized anxiety disorder: Secondary | ICD-10-CM | POA: Diagnosis not present

## 2021-04-11 DIAGNOSIS — F5101 Primary insomnia: Secondary | ICD-10-CM | POA: Diagnosis not present

## 2021-04-11 MED ORDER — CITALOPRAM HYDROBROMIDE 40 MG PO TABS
40.0000 mg | ORAL_TABLET | Freq: Every day | ORAL | 1 refills | Status: DC
  Filled 2021-04-11 – 2021-05-17 (×2): qty 30, 30d supply, fill #0
  Filled 2021-06-15: qty 30, 30d supply, fill #1

## 2021-04-11 MED ORDER — ZOLPIDEM TARTRATE 5 MG PO TABS
5.0000 mg | ORAL_TABLET | Freq: Every evening | ORAL | 0 refills | Status: DC | PRN
  Filled 2021-04-11: qty 30, 30d supply, fill #0

## 2021-04-11 NOTE — Progress Notes (Signed)
OFFICE VISIT  04/11/2021  CC:  Chief Complaint  Patient presents with  . Follow-up    hypertension   HPI:    Patient is a 55 y.o. Caucasian female who presents for f/u recurrent MDD and HTN. A/P as of last visit 3 mo ago: "Recurrent MDD, MUCH improved on citalopram 20mg  qd for the last month. Continue current dosing."  INTERIM HX: Mood is still down, sad most days, esp since she found out her son wants to join [employer].  Some crying spells, no hopelessness, no anhedonia.  No SI or HI.  Wants to isolate.  Eats more when things bother her.  Anxiety level/worry is pretty high constantly but no recent probs with panic (has had this in the past, though).   Sleep issues the last several months. Describes lifelong hx of insomnia that waxes and wanes but seemingly worse the last few months. Goes to bed 10pm, TV on--she insists she has to have it on to sleep.  Wakes up q2hrs after finally initiating sleep and gets back to sleep fairly easily.  Gets up 6:30 or so. No caffeine in evening, no evening exercise.  For a few months she has tried nightly melatonin 10mg  and hasn't helped yet. Avoids benadryl b/c of urinary/bladder issues.  Keeping up with work ok. Has supportive family and friends.  Past Medical History:  Diagnosis Date  . Acute urinary retention 2018   Once after hysterectomy surgery 2012, another spontaneous occurrence 11/2017--Dr. Jeffie Pollock started her on flomax 12/2017--much improved as of 12/22/2017 and 03/2018 urol f/u.  Marland Kitchen ALLERGIC RHINITIS 03/31/2010   Qualifier: Diagnosis of  By: Alveta Heimlich MD, Cornelia Copa    . Asthma   . Bilateral external ear infections   . Cervical spondylosis 2011   w/out myelopathy; nerve block injections done 08/2010 and 10/2010 (Dr. Letta Pate)  . Colon cancer screening 11/20/2019   cologuard neg.  Rpt 3 yrs  . Fibroids, submucosal    u/s 07/2010  . Heart murmur    Pt reports this is present intermittently, says past ECHO normal.  . Menorrhagia    Endo ablation  02/14/11  . Migraine    plus tension HA's; some occipital HA's associated with her cervicalgia  . Panic attacks   . Plantar fasciitis   . Shingles    x 2.  Once L rib cage, once L ear.    Past Surgical History:  Procedure Laterality Date  . ABDOMINAL HYSTERECTOMY    . APPENDECTOMY  1979  . CESAREAN SECTION  02/2004  . ENDOMETRIAL ABLATION  02/14/11  . ENDOMETRIAL BIOPSY  12/2010   Benign, with exogenous hormone effect.  No hyperplasia.  Marland Kitchen FINGER SURGERY  1982   Ring finger right hand; no metal/screws in finger.  . ROBOTIC ASSISTED LAPAROSCOPIC VAGINAL HYSTERECTOMY WITH FIBROID REMOVAL  02/17/12   Benign leiomyomata and benign cervix on path    Outpatient Medications Prior to Visit  Medication Sig Dispense Refill  . Aspirin-Acetaminophen-Caffeine (EXCEDRIN PO) Take by mouth every morning.    . cetirizine (ZYRTEC) 10 MG tablet Take 10 mg by mouth daily.    Marland Kitchen lisinopril (ZESTRIL) 10 MG tablet TAKE 1 TABLET (10 MG TOTAL) BY MOUTH DAILY. 90 tablet 3  . Pseudoephedrine HCl (SUDAFED PO) Take by mouth as needed.    . citalopram (CELEXA) 20 MG tablet TAKE 1 TABLET (20 MG TOTAL) BY MOUTH DAILY. 90 tablet 3   No facility-administered medications prior to visit.    No Known Allergies  ROS As per HPI  PE: Vitals with BMI 04/11/2021 12/22/2020 11/24/2020  Height 5\' 6"  5\' 6"  -  Weight 167 lbs 6 oz 161 lbs 164 lbs 10 oz  BMI 47.42 26 -  Systolic 595 638 756  Diastolic 74 75 75  Pulse 55 56 66     Gen: Alert, well appearing.  Patient is oriented to person, place, time, and situation. AFFECT: pleasant, lucid thought and speech. No further exam today.  LABS:  Lab Results  Component Value Date   TSH 1.24 11/30/2020   Lab Results  Component Value Date   WBC 4.6 11/30/2020   HGB 14.2 11/30/2020   HCT 41.2 11/30/2020   MCV 90.4 11/30/2020   PLT 239 11/30/2020   Lab Results  Component Value Date   CREATININE 0.86 11/30/2020   BUN 20 11/30/2020   NA 140 11/30/2020   K 4.2  11/30/2020   CL 104 11/30/2020   CO2 25 11/30/2020   Lab Results  Component Value Date   ALT 18 11/30/2020   AST 17 11/30/2020   ALKPHOS 58 11/03/2019   BILITOT 1.1 11/30/2020   Lab Results  Component Value Date   CHOL 176 11/30/2020   Lab Results  Component Value Date   HDL 57 11/30/2020   Lab Results  Component Value Date   LDLCALC 104 (H) 11/30/2020   Lab Results  Component Value Date   TRIG 67 11/30/2020   Lab Results  Component Value Date   CHOLHDL 3.1 11/30/2020   IMPRESSION AND PLAN:  1) MDD, mild/mod episode, in partial remission but therapeutic effect of the 20mg  citalopram has plateaued. Inc cital to 40 qd.  Therapeutic expectations and side effect profile of medication discussed today.  Patient's questions answered.  2) GAD: w/out panic.  Hopefully we'll see this improve with inc of citalopram to 40mg  qd dosing.  3) Primary insomnia, complicated by her anx/depression of late. Antihistamines contraindicated d/t her hx of urinary retention. Start trial of ambien 5mg  qhs prn, inc to 10mg  qhs prn if not helpful after 3-4 tries.  No labs needed today  An After Visit Summary was printed and given to the patient.  FOLLOW UP: Return in about 4 weeks (around 05/09/2021) for f/u dep/anx/insomn.  Signed:  Crissie Sickles, MD           04/11/2021

## 2021-04-12 ENCOUNTER — Other Ambulatory Visit (HOSPITAL_COMMUNITY): Payer: Self-pay

## 2021-05-09 ENCOUNTER — Ambulatory Visit: Payer: No Typology Code available for payment source | Admitting: Family Medicine

## 2021-05-09 ENCOUNTER — Encounter: Payer: Self-pay | Admitting: Family Medicine

## 2021-05-09 NOTE — Progress Notes (Signed)
Virtual Visit via Video Note  I connected with Sara Burton on 05/10/21 at 10:30 AM EDT by a video enabled telemedicine application and verified that I am speaking with the correct person using two identifiers.  Location patient: home, [place] Location provider:work or home office Persons participating in the virtual visit: patient, provider  I discussed the limitations of evaluation and management by telemedicine and the availability of in person appointments. The patient expressed understanding and agreed to proceed.  Telemedicine visit is a necessity given the COVID-19 restrictions in place at the current time.  HPI: 55 y/o WF being seen today for respiratory symptoms.  Also for 1 mo f/u recurrent MDD--increased citalopram to 40mg  1 mo ago.  Onset of symptoms about 6 d/a increased sneezing, then ST, lots of nonproductive coughing, ears painful, L>R maxillary sinus pain, lots of nasal mucous.  ST has resolved.  +malaise/fatigue.  No wheezing. Tm 102 a couple nights ago. T 99 last night.  Covid testing x 2 NEG. She is fully vaccinated for covid. Mucinex DM, tylenol cold/flu, advil, and nyquil of minimal help.   -COVID-19 vaccine status: UTD.  Regarding her mood/anxiety she feels like the increased dose of citalopram has been helpful. No adverse side effect from increased dose.  ROS: See pertinent positives and negatives per HPI.  Past Medical History:  Diagnosis Date  . Acute urinary retention 2018   Once after hysterectomy surgery 2012, another spontaneous occurrence 11/2017--Dr. Jeffie Pollock started her on flomax 12/2017--much improved as of 12/22/2017 and 03/2018 urol f/u.  Marland Kitchen ALLERGIC RHINITIS 03/31/2010   Qualifier: Diagnosis of  By: Alveta Heimlich MD, Cornelia Copa    . Asthma   . Cervical spondylosis 2011   w/out myelopathy; nerve block injections done 08/2010 and 10/2010 (Dr. Letta Pate)  . Colon cancer screening 11/20/2019   cologuard neg.  Rpt 3 yrs  . Essential hypertension   . Fibroids, submucosal    u/s  07/2010  . GAD (generalized anxiety disorder)    with panic attacks  . Heart murmur    Sara Burton reports this is present intermittently, says past ECHO normal.  . IFG (impaired fasting glucose)   . MDD (major depressive disorder)   . Menorrhagia    Endo ablation 02/14/11  . Migraine    plus tension HA's; some occipital HA's associated with her cervicalgia  . Plantar fasciitis   . Shingles    x 2.  Once L rib cage, once L ear.    Past Surgical History:  Procedure Laterality Date  . ABDOMINAL HYSTERECTOMY    . APPENDECTOMY  1979  . CESAREAN SECTION  02/2004  . ENDOMETRIAL ABLATION  02/14/11  . ENDOMETRIAL BIOPSY  12/2010   Benign, with exogenous hormone effect.  No hyperplasia.  Marland Kitchen FINGER SURGERY  1982   Ring finger right hand; no metal/screws in finger.  . ROBOTIC ASSISTED LAPAROSCOPIC VAGINAL HYSTERECTOMY WITH FIBROID REMOVAL  02/17/12   Benign leiomyomata and benign cervix on path    Current Outpatient Medications:  .  citalopram (CELEXA) 40 MG tablet, Take 1 tablet (40 mg total) by mouth daily., Disp: 30 tablet, Rfl: 1 .  lisinopril (ZESTRIL) 10 MG tablet, TAKE 1 TABLET (10 MG TOTAL) BY MOUTH DAILY., Disp: 90 tablet, Rfl: 3 .  Pseudoephedrine HCl (SUDAFED PO), Take by mouth as needed., Disp: , Rfl:  .  zolpidem (AMBIEN) 5 MG tablet, Take 1-2 tablets (5-10 mg total) by mouth at bedtime as needed for insomnia, Disp: 30 tablet, Rfl: 0 .  Aspirin-Acetaminophen-Caffeine (EXCEDRIN PO), Take  by mouth every morning. (Patient not taking: Reported on 05/10/2021), Disp: , Rfl:  .  cetirizine (ZYRTEC) 10 MG tablet, Take 10 mg by mouth daily. (Patient not taking: Reported on 05/10/2021), Disp: , Rfl:   EXAM:  VITALS per patient if applicable:  Vitals with BMI 05/10/2021 04/11/2021 12/22/2020  Height - 5\' 6"  5\' 6"   Weight - 167 lbs 6 oz 161 lbs  BMI - 74.08 26  Systolic 144 818 563  Diastolic 82 74 75  Pulse - 55 56     GENERAL: alert, oriented, appears well and in no acute distress  HEENT:  atraumatic, conjunttiva clear, no obvious abnormalities on inspection of external nose and ears  NECK: normal movements of the head and neck  LUNGS: on inspection no signs of respiratory distress, breathing rate appears normal, no obvious gross SOB, gasping or wheezing  CV: no obvious cyanosis  MS: moves all visible extremities without noticeable abnormality  PSYCH/NEURO: pleasant and cooperative, no obvious depression or anxiety, speech and thought processing grossly intact  LABS: none today    Chemistry      Component Value Date/Time   NA 140 11/30/2020 0855   NA 141 06/03/2018 0825   K 4.2 11/30/2020 0855   CL 104 11/30/2020 0855   CO2 25 11/30/2020 0855   BUN 20 11/30/2020 0855   BUN 16 06/03/2018 0825   CREATININE 0.86 11/30/2020 0855      Component Value Date/Time   CALCIUM 9.5 11/30/2020 0855   ALKPHOS 58 11/03/2019 1035   AST 17 11/30/2020 0855   ALT 18 11/30/2020 0855   BILITOT 1.1 11/30/2020 0855   BILITOT 0.9 06/03/2018 0825       ASSESSMENT AND PLAN:  Discussed the following assessment and plan:  1) URI with cough/congestion/fever. Fever has resolved but I feel that she now has acute bacterial sinusitis.  Suspect some bronchitis but no clear signs of RAD. Plan: azithromycin x 5d, tessalon pearls 200 tid prn.  Continue tylenol or motrin q6h prn, cont saline nasal spray. Stop mucinex dm.  2) Anx/dep: we discussed this minimally today. Cont citalopram 40mg  qd and keep f/u set for next week regarding this problem.  I discussed the assessment and treatment plan with the patient. The patient was provided an opportunity to ask questions and all were answered. The patient agreed with the plan and demonstrated an understanding of the instructions.   F/u: has f/u in 6 d for recheck dep/anx Next cpe 11/2021  Signed:  Crissie Sickles, MD           05/10/2021

## 2021-05-09 NOTE — Progress Notes (Deleted)
OFFICE VISIT  05/09/2021  CC: No chief complaint on file.   HPI:    Patient is a 55 y.o. Caucasian female who presents for 1 mo f/u A/P as of last visit: "1) MDD, mild/mod episode, in partial remission but therapeutic effect of the 20mg  citalopram has plateaued. Inc cital to 40 qd.  Therapeutic expectations and side effect profile of medication discussed today.  Patient's questions answered.  2) GAD: w/out panic.  Hopefully we'll see this improve with inc of citalopram to 40mg  qd dosing.  3) Primary insomnia, complicated by her anx/depression of late. Antihistamines contraindicated d/t her hx of urinary retention. Start trial of ambien 5mg  qhs prn, inc to 10mg  qhs prn if not helpful after 3-4 tries."  INTERIM HX: ***    Past Medical History:  Diagnosis Date  . Acute urinary retention 2018   Once after hysterectomy surgery 2012, another spontaneous occurrence 11/2017--Dr. Jeffie Pollock started her on flomax 12/2017--much improved as of 12/22/2017 and 03/2018 urol f/u.  Marland Kitchen ALLERGIC RHINITIS 03/31/2010   Qualifier: Diagnosis of  By: Alveta Heimlich MD, Cornelia Copa    . Asthma   . Bilateral external ear infections   . Cervical spondylosis 2011   w/out myelopathy; nerve block injections done 08/2010 and 10/2010 (Dr. Letta Pate)  . Colon cancer screening 11/20/2019   cologuard neg.  Rpt 3 yrs  . Fibroids, submucosal    u/s 07/2010  . Heart murmur    Pt reports this is present intermittently, says past ECHO normal.  . Menorrhagia    Endo ablation 02/14/11  . Migraine    plus tension HA's; some occipital HA's associated with her cervicalgia  . Panic attacks   . Plantar fasciitis   . Shingles    x 2.  Once L rib cage, once L ear.    Past Surgical History:  Procedure Laterality Date  . ABDOMINAL HYSTERECTOMY    . APPENDECTOMY  1979  . CESAREAN SECTION  02/2004  . ENDOMETRIAL ABLATION  02/14/11  . ENDOMETRIAL BIOPSY  12/2010   Benign, with exogenous hormone effect.  No hyperplasia.  Marland Kitchen FINGER SURGERY  1982    Ring finger right hand; no metal/screws in finger.  . ROBOTIC ASSISTED LAPAROSCOPIC VAGINAL HYSTERECTOMY WITH FIBROID REMOVAL  02/17/12   Benign leiomyomata and benign cervix on path    Outpatient Medications Prior to Visit  Medication Sig Dispense Refill  . Aspirin-Acetaminophen-Caffeine (EXCEDRIN PO) Take by mouth every morning.    . cetirizine (ZYRTEC) 10 MG tablet Take 10 mg by mouth daily.    . citalopram (CELEXA) 40 MG tablet Take 1 tablet (40 mg total) by mouth daily. 30 tablet 1  . lisinopril (ZESTRIL) 10 MG tablet TAKE 1 TABLET (10 MG TOTAL) BY MOUTH DAILY. 90 tablet 3  . Pseudoephedrine HCl (SUDAFED PO) Take by mouth as needed.    . zolpidem (AMBIEN) 5 MG tablet Take 1-2 tablets (5-10 mg total) by mouth at bedtime as needed for insomnia 30 tablet 0   No facility-administered medications prior to visit.    No Known Allergies  ROS As per HPI  PE: Vitals with BMI 04/11/2021 12/22/2020 11/24/2020  Height 5\' 6"  5\' 6"  -  Weight 167 lbs 6 oz 161 lbs 164 lbs 10 oz  BMI 75.64 26 -  Systolic 332 951 884  Diastolic 74 75 75  Pulse 55 56 66     ***  LABS:    Chemistry      Component Value Date/Time   NA 140 11/30/2020 0855  NA 141 06/03/2018 0825   K 4.2 11/30/2020 0855   CL 104 11/30/2020 0855   CO2 25 11/30/2020 0855   BUN 20 11/30/2020 0855   BUN 16 06/03/2018 0825   CREATININE 0.86 11/30/2020 0855      Component Value Date/Time   CALCIUM 9.5 11/30/2020 0855   ALKPHOS 58 11/03/2019 1035   AST 17 11/30/2020 0855   ALT 18 11/30/2020 0855   BILITOT 1.1 11/30/2020 0855   BILITOT 0.9 06/03/2018 0825     Lab Results  Component Value Date   HGBA1C 5.7 (H) 11/30/2020   IMPRESSION AND PLAN:  No problem-specific Assessment & Plan notes found for this encounter.   An After Visit Summary was printed and given to the patient.  FOLLOW UP: No follow-ups on file. cpe any time now  Signed:  Crissie Sickles, MD           05/09/2021

## 2021-05-10 ENCOUNTER — Encounter: Payer: Self-pay | Admitting: Family Medicine

## 2021-05-10 ENCOUNTER — Telehealth (INDEPENDENT_AMBULATORY_CARE_PROVIDER_SITE_OTHER): Payer: No Typology Code available for payment source | Admitting: Family Medicine

## 2021-05-10 VITALS — BP 127/82 | Temp 98.7°F

## 2021-05-10 DIAGNOSIS — J0100 Acute maxillary sinusitis, unspecified: Secondary | ICD-10-CM | POA: Diagnosis not present

## 2021-05-10 DIAGNOSIS — J069 Acute upper respiratory infection, unspecified: Secondary | ICD-10-CM

## 2021-05-10 MED ORDER — AZITHROMYCIN 250 MG PO TABS: ORAL_TABLET | ORAL | 0 refills | Status: DC

## 2021-05-10 MED ORDER — BENZONATATE 200 MG PO CAPS: 200.0000 mg | ORAL_CAPSULE | Freq: Three times a day (TID) | ORAL | 0 refills | Status: DC | PRN

## 2021-05-13 ENCOUNTER — Encounter: Payer: Self-pay | Admitting: Family Medicine

## 2021-05-14 ENCOUNTER — Encounter: Payer: Self-pay | Admitting: Family Medicine

## 2021-05-14 MED ORDER — PREDNISONE 20 MG PO TABS: ORAL_TABLET | ORAL | 0 refills | Status: DC

## 2021-05-14 NOTE — Telephone Encounter (Signed)
Best course of action is oral prednisone for 5 days, not prednisone shot. I'll eRx prednisone now. Make sure she's using plenty of saline nasal spray.

## 2021-05-14 NOTE — Telephone Encounter (Signed)
Please Advise

## 2021-05-14 NOTE — Telephone Encounter (Signed)
Prior MyChart message sent for review to PCP.

## 2021-05-16 ENCOUNTER — Ambulatory Visit (INDEPENDENT_AMBULATORY_CARE_PROVIDER_SITE_OTHER): Payer: No Typology Code available for payment source | Admitting: Family Medicine

## 2021-05-16 ENCOUNTER — Other Ambulatory Visit: Payer: Self-pay

## 2021-05-16 ENCOUNTER — Encounter: Payer: Self-pay | Admitting: Family Medicine

## 2021-05-16 ENCOUNTER — Other Ambulatory Visit (HOSPITAL_COMMUNITY): Payer: Self-pay

## 2021-05-16 VITALS — BP 119/76 | HR 60 | Temp 98.3°F | Resp 16 | Ht 66.0 in | Wt 165.4 lb

## 2021-05-16 DIAGNOSIS — F3342 Major depressive disorder, recurrent, in full remission: Secondary | ICD-10-CM

## 2021-05-16 DIAGNOSIS — F411 Generalized anxiety disorder: Secondary | ICD-10-CM

## 2021-05-16 DIAGNOSIS — F5104 Psychophysiologic insomnia: Secondary | ICD-10-CM | POA: Diagnosis not present

## 2021-05-16 MED ORDER — ZOLPIDEM TARTRATE 5 MG PO TABS
5.0000 mg | ORAL_TABLET | Freq: Every evening | ORAL | 5 refills | Status: DC | PRN
  Filled 2021-05-16: qty 30, 30d supply, fill #0
  Filled 2021-06-15: qty 30, 30d supply, fill #1
  Filled 2021-07-25: qty 30, 30d supply, fill #2
  Filled 2021-08-21: qty 30, 30d supply, fill #3
  Filled 2021-09-17: qty 30, 30d supply, fill #4
  Filled 2021-10-15: qty 30, 30d supply, fill #5

## 2021-05-16 NOTE — Progress Notes (Signed)
OFFICE VISIT  05/16/2021  CC:  Chief Complaint  Patient presents with  . Follow-up    Anxiety/depression/insomnia   HPI:    Patient is a 55 y.o. Caucasian female who presents for 1 mo f/u recurrent MDD, GAD, and insomnia. A/P as of last visit: "1) MDD, mild/mod episode, in partial remission but therapeutic effect of the 20mg  citalopram has plateaued. Inc cital to 40 qd.  Therapeutic expectations and side effect profile of medication discussed today.  Patient's questions answered.  2) GAD: w/out panic.  Hopefully we'll see this improve with inc of citalopram to 40mg  qd dosing.  3) Primary insomnia, complicated by her anx/depression of late. Antihistamines contraindicated d/t her hx of urinary retention. Start trial of ambien 5mg  qhs prn, inc to 10mg  qhs prn if not helpful after 3-4 tries."  INTERIM HX: I saw her on 05/10/21 for telemed visit for acute URI with cough, treated with azith for suspected bacterial sinusitis, added prednisone 5d burst on 05/13/21.  Currently: Sinus cong and PND and sinus pressure all improving some.  PND cough still somewhat of a problem. Irritation below nares from all the mucous/nose-blowing--improving with neosporin and chapstick. Finished z pack, still finishing prednisone and taking tessalon.  NO subj fever in last 3d.  Says she does feel some improvement in mood and anxiety on cital 40mg . Sleep improved on zolpidem 5mg   qhs but only lasts until 4AM.  She did try 10mg  dose once and had no adverse side effects but didn't last longer.  Her bedtime varies.  PMP AWARE reviewed today: most recent rx for ambien 5mg  was filled 04/11/21, # 30, rx by me. No red flags.   Past Medical History:  Diagnosis Date  . Acute urinary retention 2018   Once after hysterectomy surgery 2012, another spontaneous occurrence 11/2017--Dr. Jeffie Pollock started her on flomax 12/2017--much improved as of 12/22/2017 and 03/2018 urol f/u.  Marland Kitchen ALLERGIC RHINITIS 03/31/2010   Qualifier:  Diagnosis of  By: Alveta Heimlich MD, Cornelia Copa    . Asthma   . Cervical spondylosis 2011   w/out myelopathy; nerve block injections done 08/2010 and 10/2010 (Dr. Letta Pate)  . Colon cancer screening 11/20/2019   cologuard neg.  Rpt 3 yrs  . Essential hypertension   . Fibroids, submucosal    u/s 07/2010  . GAD (generalized anxiety disorder)    with panic attacks  . Heart murmur    Pt reports this is present intermittently, says past ECHO normal.  . IFG (impaired fasting glucose)   . MDD (major depressive disorder)   . Menorrhagia    Endo ablation 02/14/11  . Migraine    plus tension HA's; some occipital HA's associated with her cervicalgia  . Plantar fasciitis   . Shingles    x 2.  Once L rib cage, once L ear.    Past Surgical History:  Procedure Laterality Date  . ABDOMINAL HYSTERECTOMY    . APPENDECTOMY  1979  . CESAREAN SECTION  02/2004  . ENDOMETRIAL ABLATION  02/14/11  . ENDOMETRIAL BIOPSY  12/2010   Benign, with exogenous hormone effect.  No hyperplasia.  Marland Kitchen FINGER SURGERY  1982   Ring finger right hand; no metal/screws in finger.  . ROBOTIC ASSISTED LAPAROSCOPIC VAGINAL HYSTERECTOMY WITH FIBROID REMOVAL  02/17/12   Benign leiomyomata and benign cervix on path    Outpatient Medications Prior to Visit  Medication Sig Dispense Refill  . benzonatate (TESSALON) 200 MG capsule Take 1 capsule (200 mg total) by mouth 3 (three) times daily as  needed for cough. 30 capsule 0  . citalopram (CELEXA) 40 MG tablet Take 1 tablet (40 mg total) by mouth daily. 30 tablet 1  . lisinopril (ZESTRIL) 10 MG tablet TAKE 1 TABLET (10 MG TOTAL) BY MOUTH DAILY. 90 tablet 3  . predniSONE (DELTASONE) 20 MG tablet 2 tabs po qd x 5d 10 tablet 0  . Pseudoephedrine HCl (SUDAFED PO) Take by mouth as needed.    . zolpidem (AMBIEN) 5 MG tablet Take 1-2 tablets (5-10 mg total) by mouth at bedtime as needed for insomnia 30 tablet 0  . Aspirin-Acetaminophen-Caffeine (EXCEDRIN PO) Take by mouth every morning. (Patient not  taking: No sig reported)    . cetirizine (ZYRTEC) 10 MG tablet Take 10 mg by mouth daily. (Patient not taking: No sig reported)    . azithromycin (ZITHROMAX) 250 MG tablet 2 tabs po qd x 1d, then 1 tab po qd x 4d (Patient not taking: Reported on 05/16/2021) 6 tablet 0   No facility-administered medications prior to visit.    No Known Allergies  ROS As per HPI  PE: Vitals with BMI 05/16/2021 05/10/2021 04/11/2021  Height 5\' 6"  - 5\' 6"   Weight 165 lbs 6 oz - 167 lbs 6 oz  BMI 40.98 - 11.91  Systolic 478 295 621  Diastolic 76 82 74  Pulse 60 - 55   Gen: Alert, well appearing.  Patient is oriented to person, place, time, and situation. AFFECT: pleasant, lucid thought and speech. VS: noted--normal. Gen: alert, NAD, NONTOXIC APPEARING. HEENT: eyes without injection, drainage, or swelling.  Ears: EACs clear, TMs with normal light reflex and landmarks.  Nose: Clear rhinorrhea, with some dried, crusty exudate adherent to mildly injected mucosa.  No purulent d/c.  No paranasal sinus TTP.  No facial swelling.  Throat and mouth without focal lesion.  No pharyngial swelling, erythema, or exudate.  Nickel sized area of irritated skin under L nostril. Neck: supple, no LAD.   LUNGS: CTA bilat, nonlabored resps.   CV: RRR, no m/r/g. EXT: no c/c/e SKIN: no rash    LABS:    Chemistry      Component Value Date/Time   NA 140 11/30/2020 0855   NA 141 06/03/2018 0825   K 4.2 11/30/2020 0855   CL 104 11/30/2020 0855   CO2 25 11/30/2020 0855   BUN 20 11/30/2020 0855   BUN 16 06/03/2018 0825   CREATININE 0.86 11/30/2020 0855      Component Value Date/Time   CALCIUM 9.5 11/30/2020 0855   ALKPHOS 58 11/03/2019 1035   AST 17 11/30/2020 0855   ALT 18 11/30/2020 0855   BILITOT 1.1 11/30/2020 0855   BILITOT 0.9 06/03/2018 0825      IMPRESSION AND PLAN:  1) MDD, recurrent, in remission.  GAD, well controlled. Cont citalopram 40mg  qd.  2) Insomnia, largely related to her anxiety. Improved with  ambien 5-10mg  qhs prn. Continue this.  Rx for 5mg  tabs, 1-2 qhs prn, #45, rF x 5. Controlled substance contract reviewed with patient today.  Patient signed this and it will be placed in the chart.    3) URI/sinusitis, with PND cough. Gradually resolving s/p z-pack and prednisone.  Cont tessalon pearls for cough.  An After Visit Summary was printed and given to the patient.  FOLLOW UP: Return in about 6 months (around 11/15/2021) for annual CPE (fasting).  Signed:  Crissie Sickles, MD           05/16/2021

## 2021-05-17 ENCOUNTER — Other Ambulatory Visit (HOSPITAL_COMMUNITY): Payer: Self-pay

## 2021-05-17 MED FILL — Lisinopril Tab 10 MG: ORAL | 90 days supply | Qty: 90 | Fill #0 | Status: AC

## 2021-05-21 ENCOUNTER — Telehealth: Payer: Self-pay

## 2021-05-21 ENCOUNTER — Encounter: Payer: Self-pay | Admitting: Family Medicine

## 2021-05-21 ENCOUNTER — Other Ambulatory Visit: Payer: Self-pay

## 2021-05-21 ENCOUNTER — Ambulatory Visit (INDEPENDENT_AMBULATORY_CARE_PROVIDER_SITE_OTHER): Payer: No Typology Code available for payment source | Admitting: Family Medicine

## 2021-05-21 ENCOUNTER — Other Ambulatory Visit: Payer: Self-pay | Admitting: Internal Medicine

## 2021-05-21 VITALS — BP 104/66 | HR 67 | Temp 99.0°F | Resp 16 | Ht 66.0 in | Wt 168.4 lb

## 2021-05-21 DIAGNOSIS — J4521 Mild intermittent asthma with (acute) exacerbation: Secondary | ICD-10-CM

## 2021-05-21 DIAGNOSIS — J069 Acute upper respiratory infection, unspecified: Secondary | ICD-10-CM

## 2021-05-21 MED ORDER — ALBUTEROL SULFATE HFA 108 (90 BASE) MCG/ACT IN AERS: 1.0000 | INHALATION_SPRAY | RESPIRATORY_TRACT | 0 refills | Status: DC | PRN

## 2021-05-21 MED ORDER — PREDNISONE 20 MG PO TABS: ORAL_TABLET | ORAL | 0 refills | Status: DC

## 2021-05-21 MED ORDER — HYDROCODONE BIT-HOMATROP MBR 5-1.5 MG/5ML PO SOLN: ORAL | 0 refills | Status: DC

## 2021-05-21 NOTE — Telephone Encounter (Signed)
OK, [facility]cod[facility] [place].

## 2021-05-21 NOTE — Progress Notes (Signed)
OFFICE VISIT  05/21/2021  CC:  Chief Complaint  Patient presents with   Cough    Congestion, 2 weeks; 4 negative covid & flu test   HPI:    Patient is a 55 y.o. Caucasian female with hx of mild intermittent asthma who presents for "cough and congestion x 2 wks, negative covid and flu". I saw her 05/10/21 for same sx's and felt she had acute sinusitis and rx'd Z-pack and tessalon pearls.  Not long after that pt called stating cough still bad so I added prednisone burst. At f/u for anx/dep on 6/8 she was improving some, no new meds rx'd.  Currently: Feels just a smidge better.  Still with coughing "fits", feeling heavy in chest, mostly nonproductive cough. Denies wheezing.  No fevers.  Breaks out in sweats frequently. Can't get her energy back.  Tessalon pearles.  Not clear whether this med helping or not. Cough keeping her up a lot at night.  Has albut inhaler and says it doesn't help any--it was rx'd by me last in 2014.  ROS as above, plus--> no CP, no SOB, no dizziness, no HAs, no rashes, no melena/hematochezia.  No polyuria or polydipsia.  No myalgias or arthralgias.  No focal weakness, paresthesias, or tremors.  No acute vision or hearing abnormalities.  No dysuria or unusual/new urinary urgency or frequency.  No recent changes in lower legs. No n/v/d or abd pain.  No palpitations.    Past Medical History:  Diagnosis Date   Acute urinary retention 2018   Once after hysterectomy surgery 2012, another spontaneous occurrence 11/2017--Dr. Jeffie Pollock started her on flomax 12/2017--much improved as of 12/22/2017 and 03/2018 urol f/u.   ALLERGIC RHINITIS 03/31/2010   Qualifier: Diagnosis of  By: Alveta Heimlich MD, Cornelia Copa     Asthma    Cervical spondylosis 2011   w/out myelopathy; nerve block injections done 08/2010 and 10/2010 (Dr. Letta Pate)   Colon cancer screening 11/20/2019   cologuard neg.  Rpt 3 yrs   Essential hypertension    Fibroids, submucosal    u/s 07/2010   GAD (generalized anxiety  disorder)    with panic attacks   Heart murmur    Pt reports this is present intermittently, says past ECHO normal.   IFG (impaired fasting glucose)    MDD (major depressive disorder)    Menorrhagia    Endo ablation 02/14/11   Migraine    plus tension HA's; some occipital HA's associated with her cervicalgia   Plantar fasciitis    Shingles    x 2.  Once L rib cage, once L ear.    Past Surgical History:  Procedure Laterality Date   ABDOMINAL HYSTERECTOMY     APPENDECTOMY  1979   CESAREAN SECTION  02/2004   ENDOMETRIAL ABLATION  02/14/11   ENDOMETRIAL BIOPSY  12/2010   Benign, with exogenous hormone effect.  No hyperplasia.   FINGER SURGERY  1982   Ring finger right hand; no metal/screws in finger.   ROBOTIC ASSISTED LAPAROSCOPIC VAGINAL HYSTERECTOMY WITH FIBROID REMOVAL  02/17/12   Benign leiomyomata and benign cervix on path    Outpatient Medications Prior to Visit  Medication Sig Dispense Refill   citalopram (CELEXA) 40 MG tablet Take 1 tablet (40 mg total) by mouth daily. 30 tablet 1   lisinopril (ZESTRIL) 10 MG tablet TAKE 1 TABLET (10 MG TOTAL) BY MOUTH DAILY. 90 tablet 3   Pseudoephedrine HCl (SUDAFED PO) Take by mouth as needed.     zolpidem (AMBIEN) 5 MG tablet  Take 1-2 tablets (5-10 mg total) by mouth at bedtime as needed for insomnia 45 tablet 5   benzonatate (TESSALON) 200 MG capsule Take 1 capsule (200 mg total) by mouth 3 (three) times daily as needed for cough. 30 capsule 0   Aspirin-Acetaminophen-Caffeine (EXCEDRIN PO) Take by mouth every morning. (Patient not taking: No sig reported)     cetirizine (ZYRTEC) 10 MG tablet Take 10 mg by mouth daily. (Patient not taking: No sig reported)     predniSONE (DELTASONE) 20 MG tablet 2 tabs po qd x 5d (Patient not taking: Reported on 05/21/2021) 10 tablet 0   No facility-administered medications prior to visit.    No Known Allergies  ROS As per HPI  PE: Vitals with BMI 05/21/2021 05/16/2021 05/10/2021  Height 5\' 6"  5\' 6"  -   Weight 168 lbs 6 oz 165 lbs 6 oz -  BMI 84.16 60.63 -  Systolic 016 010 932  Diastolic 66 76 82  Pulse 67 60 -  VS: noted--normal. Gen: alert, NAD, NONTOXIC APPEARING. HEENT: eyes without injection, drainage, or swelling.  Ears: EACs clear, TMs with normal light reflex and landmarks.  Nose: Clear rhinorrhea, with some dried, crusty exudate adherent to mildly injected mucosa.  No purulent d/c.  No paranasal sinus TTP.  No facial swelling.  Throat and mouth without focal lesion.  No pharyngial swelling, erythema, or exudate.   Neck: supple, no LAD.   LUNGS: CTA bilat, nonlabored resps.  With forced exp maneuver she has no wheeze but has prolonged exp phase and mild dec aeration diffusely. CV: RRR, no m/r/g. EXT: no c/c/e SKIN: no rash   LABS:  none  IMPRESSION AND PLAN:  Prolonged URI with cough and RAD. Prednisone 40 qd x 5d, then 20 qd x 5d. Rx'd new Albuterol hfa, 1-2 p q4h prn->hers is expired. Stop tessalon pearls.  Start hycodan suspension 1-2 tsp bid prn cough, #120 ml.  An After Visit Summary was printed and given to the patient.  FOLLOW UP: Return if symptoms worsen or fail to improve.  Signed:  Crissie Sickles, MD           05/21/2021

## 2021-05-21 NOTE — Telephone Encounter (Signed)
Please Advise. Med pending

## 2021-05-21 NOTE — Telephone Encounter (Signed)
[facility]  [facility] [place] They have verified in stock  HYDROcodone bit-homatropine (HYCODAN) 5-1.5 MG/5ML syrup [[id number]]

## 2021-05-21 NOTE — Progress Notes (Signed)
[facility], [facility]

## 2021-05-22 NOTE — Telephone Encounter (Signed)
[place] [facility] - Client TELEPHONE ADVICE RECORD [facility] Patient Name: [facility] SPE NCE-IRWIN Gender: Female DOB: 09-12-1966 Age: 55 Y 32 M 6 D Return Phone Number: [phone] (Primary) Address: City/ State/ Zip: [place]  [address] Client [place] [facility] - Client Client Site [place] - Day Physician Crissie Sickles - MD Contact Type Call Who Is Calling Patient / Member / Family / Caregiver Call Type Triage / Clinical Relationship To Patient Self Return Phone Number [phone] (Primary) Chief Complaint Prescription Refill or Medication Request (non symptomatic) Reason for Call Medication Question / Request Initial Comment Caller says that she was seen today. Her doctor called in a new med for cough but they did not have it, and they called in to [facility] but she is there and they don't have the script. Translation No Nurse Assessment Nurse: Long, RN, Erin Date/Time (Eastern Time): 05/21/2021 5:26:47 PM Confirm and document reason for call. If symptomatic, describe symptoms. ---Caller says that she was seen today. Her doctor called in a new med for cough but they were out of stock, and Office called in to [facility] but she is there and they don't have the script. Does the patient have any new or worsening symptoms? ---No Nurse: Long, RN, Erin Date/Time (Eastern Time): 05/21/2021 5:27:28 PM Please select the assessment type ---RX called in but not at pharm Additional Documentation ---Caller says that she was seen today. Her doctor called in a new med for cough but they were out of stock, and Office called in to [facility] but she is there and they don't have the script. Document the name of the medication. ---Hydrocodone Cough Syrup Pharmacy name and phone number. ---[facility] in [address], [place], [place] [address] [phone] Has the office closed within the last 30 minutes? ---Yes Were you  able to reach someone on the backline? ---No Does the client directives allow for assistance with medications after hours? ---Yes Is there an on-call physician for the client? ---Yes PLEASE NOTE: All timestamps contained within this report are represented as [place] Standard Time. CONFIDENTIALTY NOTICE: This fax transmission is intended only for the addressee. It contains information that is legally privileged, confidential or otherwise protected from use or disclosure. If you are not the intended recipient, you are strictly prohibited from reviewing, disclosing, copying using or disseminating any of this information or taking any action in reliance on or regarding this information. If you have received this fax in error, please notify us immediately by telephone so that we can arrange for its return to [place]. Phone: [phone], Toll-Free: [phone], Fax: [phone] Page: 2 of 2 Call Id: [id number] [place]. Time Eilene Ghazi Time) Disposition Final User 05/21/2021 5:32:36 PM Paged On Call back to [facility], [place], Junie Panning 05/21/2021 Aldine Call Long, RN, Junie Panning Reason: Calling to verify that RX is not at Pharmacy, Rx not at Pharmacy 05/21/2021 5:37:24 PM Paged On Call back to [facility], RN, Junie Panning 05/21/2021 5:37:41 PM Paged On Call back to [facility], [place], Junie Panning 05/21/2021 5:37:33 PM Clinical Call Yes Long, RN, Erin Comments User: Theodis Blaze, RN Date/Time Eilene Ghazi Time): 05/21/2021 6:03:54 PM Called caller back and gave MD message Paging DoctorName Phone DateTime Result/ Outcome Message Type Notes Kathlene November - MD [phone] 05/21/2021 5:32:36 PM Paged On Call Back to Call Center Doctor Paged Hi, Reaching out to you about a patient of Dr. Crissie Sickles. You can reach me back at [phone].  Thank you so much. Thanks, Junie Panning, RN Kathlene November - MD 05/21/2021 6:01:40 PM Spoke with On Call - General Message Result Per On-Call, "I will send over electronically.

## 2021-05-22 NOTE — Telephone Encounter (Signed)
Pt was able to pick up rx yesterday.

## 2021-06-07 ENCOUNTER — Other Ambulatory Visit: Payer: Self-pay

## 2021-06-07 ENCOUNTER — Other Ambulatory Visit: Payer: Self-pay | Admitting: Family Medicine

## 2021-06-07 ENCOUNTER — Ambulatory Visit
Admission: RE | Admit: 2021-06-07 | Discharge: 2021-06-07 | Disposition: A | Discharge status: Home / Self Care | Payer: No Typology Code available for payment source | Source: Ambulatory Visit | Attending: Family Medicine | Admitting: Family Medicine

## 2021-06-07 DIAGNOSIS — Z1231 Encounter for screening mammogram for malignant neoplasm of breast: Secondary | ICD-10-CM

## 2021-06-15 ENCOUNTER — Other Ambulatory Visit (HOSPITAL_COMMUNITY): Payer: Self-pay

## 2021-07-19 ENCOUNTER — Other Ambulatory Visit (HOSPITAL_BASED_OUTPATIENT_CLINIC_OR_DEPARTMENT_OTHER): Payer: Self-pay

## 2021-07-19 ENCOUNTER — Ambulatory Visit: Payer: No Typology Code available for payment source | Attending: Internal Medicine

## 2021-07-19 ENCOUNTER — Other Ambulatory Visit: Payer: Self-pay

## 2021-07-19 DIAGNOSIS — Z23 Encounter for immunization: Secondary | ICD-10-CM

## 2021-07-19 MED ORDER — PFIZER-BIONT COVID-19 VAC-TRIS 30 MCG/0.3ML IM SUSP
INTRAMUSCULAR | 0 refills | Status: DC
  Filled 2021-07-19: qty 0.3, 1d supply, fill #0

## 2021-07-19 NOTE — Progress Notes (Signed)
   Covid-19 Vaccination Clinic  Name:  Sara Burton    MRN: [id number] DOB: August 28, 1966  07/19/2021  [name] was observed post Covid-19 immunization for 15 minutes without incident. She was provided with Vaccine Information Sheet and instruction to access the V-Safe system.   [name] was instructed to call 911 with any severe reactions post vaccine: Difficulty breathing  Swelling of face and throat  A fast heartbeat  A bad rash all over body  Dizziness and weakness   Immunizations Administered     Name Date Dose VIS Date Route   PFIZER Comrnaty(Gray TOP) Covid-19 Vaccine 07/19/2021 11:45 AM 0.3 mL 11/16/2020 Intramuscular   Manufacturer: [place]   Lot: [id number]   [place]: [phone]

## 2021-07-25 ENCOUNTER — Other Ambulatory Visit: Payer: Self-pay | Admitting: Family Medicine

## 2021-07-26 ENCOUNTER — Other Ambulatory Visit (HOSPITAL_COMMUNITY): Payer: Self-pay

## 2021-07-26 MED ORDER — CITALOPRAM HYDROBROMIDE 40 MG PO TABS
40.0000 mg | ORAL_TABLET | Freq: Every day | ORAL | 0 refills | Status: DC
  Filled 2021-07-26: qty 90, 90d supply, fill #0

## 2021-08-21 ENCOUNTER — Other Ambulatory Visit (HOSPITAL_COMMUNITY): Payer: Self-pay

## 2021-08-21 MED FILL — Lisinopril Tab 10 MG: ORAL | 90 days supply | Qty: 90 | Fill #1 | Status: AC

## 2021-09-18 ENCOUNTER — Other Ambulatory Visit (HOSPITAL_COMMUNITY): Payer: Self-pay

## 2021-10-16 ENCOUNTER — Other Ambulatory Visit (HOSPITAL_COMMUNITY): Payer: Self-pay

## 2021-11-16 ENCOUNTER — Encounter: Payer: Self-pay | Admitting: Family Medicine

## 2021-11-16 ENCOUNTER — Other Ambulatory Visit: Payer: Self-pay | Admitting: Family Medicine

## 2021-11-16 ENCOUNTER — Other Ambulatory Visit: Payer: Self-pay

## 2021-11-16 ENCOUNTER — Other Ambulatory Visit (HOSPITAL_COMMUNITY): Payer: Self-pay

## 2021-11-16 ENCOUNTER — Ambulatory Visit (INDEPENDENT_AMBULATORY_CARE_PROVIDER_SITE_OTHER): Payer: No Typology Code available for payment source | Admitting: Family Medicine

## 2021-11-16 VITALS — BP 114/74 | HR 66 | Temp 98.1°F | Ht 66.0 in | Wt 174.0 lb

## 2021-11-16 DIAGNOSIS — Z0000 Encounter for general adult medical examination without abnormal findings: Secondary | ICD-10-CM

## 2021-11-16 DIAGNOSIS — R29898 Other symptoms and signs involving the musculoskeletal system: Secondary | ICD-10-CM

## 2021-11-16 DIAGNOSIS — F3340 Major depressive disorder, recurrent, in remission, unspecified: Secondary | ICD-10-CM

## 2021-11-16 DIAGNOSIS — M545 Low back pain, unspecified: Secondary | ICD-10-CM

## 2021-11-16 DIAGNOSIS — F5104 Psychophysiologic insomnia: Secondary | ICD-10-CM | POA: Diagnosis not present

## 2021-11-16 DIAGNOSIS — R7301 Impaired fasting glucose: Secondary | ICD-10-CM | POA: Diagnosis not present

## 2021-11-16 DIAGNOSIS — M542 Cervicalgia: Secondary | ICD-10-CM

## 2021-11-16 DIAGNOSIS — I10 Essential (primary) hypertension: Secondary | ICD-10-CM | POA: Diagnosis not present

## 2021-11-16 DIAGNOSIS — G8929 Other chronic pain: Secondary | ICD-10-CM

## 2021-11-16 DIAGNOSIS — F411 Generalized anxiety disorder: Secondary | ICD-10-CM

## 2021-11-16 LAB — CBC WITH DIFFERENTIAL/PLATELET
Basophils Absolute: 0.1 10*3/uL (ref 0.0–0.1)
Basophils Relative: 1.1 % (ref 0.0–3.0)
Eosinophils Absolute: 0.4 10*3/uL (ref 0.0–0.7)
Eosinophils Relative: 7.5 % — ABNORMAL HIGH (ref 0.0–5.0)
HCT: 41.3 % (ref 36.0–46.0)
Hemoglobin: 13.7 g/dL (ref 12.0–15.0)
Lymphocytes Relative: 33.5 % (ref 12.0–46.0)
Lymphs Abs: 1.6 10*3/uL (ref 0.7–4.0)
MCHC: 33.2 g/dL (ref 30.0–36.0)
MCV: 95.7 fl (ref 78.0–100.0)
Monocytes Absolute: 0.3 10*3/uL (ref 0.1–1.0)
Monocytes Relative: 6.8 % (ref 3.0–12.0)
Neutro Abs: 2.5 10*3/uL (ref 1.4–7.7)
Neutrophils Relative %: 51.1 % (ref 43.0–77.0)
Platelets: 231 10*3/uL (ref 150.0–400.0)
RBC: 4.31 Mil/uL (ref 3.87–5.11)
RDW: 13.3 % (ref 11.5–15.5)
WBC: 4.9 10*3/uL (ref 4.0–10.5)

## 2021-11-16 LAB — COMPREHENSIVE METABOLIC PANEL
ALT: 21 U/L (ref 0–35)
AST: 19 U/L (ref 0–37)
Albumin: 4.3 g/dL (ref 3.5–5.2)
Alkaline Phosphatase: 57 U/L (ref 39–117)
BUN: 17 mg/dL (ref 6–23)
CO2: 27 mEq/L (ref 19–32)
Calcium: 9.4 mg/dL (ref 8.4–10.5)
Chloride: 104 mEq/L (ref 96–112)
Creatinine, Ser: 0.8 mg/dL (ref 0.40–1.20)
GFR: 83.63 mL/min (ref >60.00)
Glucose, Bld: 97 mg/dL (ref 70–99)
Potassium: 3.8 mEq/L (ref 3.5–5.1)
Sodium: 140 mEq/L (ref 135–145)
Total Bilirubin: 1.2 mg/dL (ref 0.2–1.2)
Total Protein: 6.4 g/dL (ref 6.0–8.3)

## 2021-11-16 LAB — LIPID PANEL
Cholesterol: 188 mg/dL (ref 0–200)
HDL: 47.9 mg/dL (ref >39.00)
NonHDL: 140.39
Total CHOL/HDL Ratio: 4
Triglycerides: 261 mg/dL — ABNORMAL HIGH (ref 0.0–149.0)
VLDL: 52.2 mg/dL — ABNORMAL HIGH (ref 0.0–40.0)

## 2021-11-16 LAB — HEMOGLOBIN A1C: Hgb A1c MFr Bld: 5.9 % (ref 4.6–6.5)

## 2021-11-16 LAB — LDL CHOLESTEROL, DIRECT: Direct LDL: 119 mg/dL

## 2021-11-16 LAB — TSH: TSH: 1.5 u[IU]/mL (ref 0.35–5.50)

## 2021-11-16 MED ORDER — CITALOPRAM HYDROBROMIDE 40 MG PO TABS
40.0000 mg | ORAL_TABLET | Freq: Every day | ORAL | 3 refills | Status: DC
  Filled 2021-11-16: qty 90, 90d supply, fill #0
  Filled 2022-02-13 (×2): qty 90, 90d supply, fill #1

## 2021-11-16 MED ORDER — LISINOPRIL 10 MG PO TABS
ORAL_TABLET | Freq: Every day | ORAL | 3 refills | Status: DC
  Filled 2021-11-16: qty 90, 90d supply, fill #0
  Filled 2022-02-12: qty 90, 90d supply, fill #1

## 2021-11-16 MED ORDER — ZOLPIDEM TARTRATE 10 MG PO TABS
10.0000 mg | ORAL_TABLET | Freq: Every evening | ORAL | 5 refills | Status: DC | PRN
  Filled 2021-11-16 – 2021-11-29 (×3): qty 30, 30d supply, fill #0
  Filled 2022-01-08: qty 30, 30d supply, fill #1
  Filled 2022-02-13: qty 30, 30d supply, fill #2

## 2021-11-16 MED ORDER — ZOLPIDEM TARTRATE 10 MG PO TABS: 10.0000 mg | ORAL_TABLET | Freq: Every evening | ORAL | 0 refills | Status: DC | PRN

## 2021-11-16 NOTE — Patient Instructions (Signed)

## 2021-11-16 NOTE — Telephone Encounter (Signed)
Pt advised regarding refills

## 2021-11-16 NOTE — Telephone Encounter (Signed)
Please Advise. Meds pending with correct pharmacy

## 2021-11-16 NOTE — Progress Notes (Signed)
Office Note 11/16/2021  CC:  Chief Complaint  Patient presents with   Annual Exam    Pt is fasting    HPI:  Patient is a 55 y.o. female who is here for annual health maintenance exam and f/u HTN as well as MDD/GAD/insomnia.  Occasional home blood pressure monitoring, always normal. Anxiety: relatively stable.  Insomnia: uses ambien most nights and it helps ok but still wakes up 3AM and just dozes some after that. PMP AWARE reviewed today: most recent rx for Lorrin Mais was filled 10/16/21, # 30, rx by me. No red flags.  New problem: Over the last 2 months has started experiencing an increase in her chronic neck pain, significant low back pain, and has noted she cannot raise up on her left foot. No radiating pain into the extremities.  Her pain sometimes involves bilateral trapezius regions, as well as vaguely in the thighs.  No calf pain.  No acute injury. Takes Tylenol or Motrin occasionally.  No paresthesias.  No loss of bowel or bladder control. She has remote history of seeing a neurosurgeon for her neck pain.  She did get some cervical spine injections but says these did not help.  ROS as above, plus--> no fevers, no CP, no SOB, no wheezing, no cough, no dizziness, no HAs, no rashes, no melena/hematochezia.  No polyuria or polydipsia.  No joint swelling.  No focal weakness other than left foot, no tremors.  No acute vision or hearing abnormalities.  No dysuria or unusual/new urinary urgency or frequency.  No recent changes in lower legs. No n/v/d or abd pain.  No palpitations.    Past Medical History:  Diagnosis Date   Acute urinary retention 2018   Once after hysterectomy surgery 2012, another spontaneous occurrence 11/2017--Dr. Jeffie Pollock started her on flomax 12/2017--much improved as of 12/22/2017 and 03/2018 urol f/u.   ALLERGIC RHINITIS 03/31/2010   Qualifier: Diagnosis of  By: Alveta Heimlich MD, Cornelia Copa     Asthma    Cervical spondylosis 2011   w/out myelopathy; nerve block injections done  08/2010 and 10/2010 (Dr. Letta Pate)   Colon cancer screening 11/20/2019   cologuard neg.  Rpt 3 yrs   Essential hypertension    Fibroids, submucosal    u/s 07/2010   GAD (generalized anxiety disorder)    with panic attacks   Heart murmur    Pt reports this is present intermittently, says past ECHO normal.   IFG (impaired fasting glucose)    MDD (major depressive disorder)    Menorrhagia    Endo ablation 02/14/11   Migraine    plus tension HA's; some occipital HA's associated with her cervicalgia   Plantar fasciitis    Shingles    x 2.  Once L rib cage, once L ear.    Past Surgical History:  Procedure Laterality Date   ABDOMINAL HYSTERECTOMY     APPENDECTOMY  1979   CESAREAN SECTION  02/2004   ENDOMETRIAL ABLATION  02/14/11   ENDOMETRIAL BIOPSY  12/2010   Benign, with exogenous hormone effect.  No hyperplasia.   FINGER SURGERY  1982   Ring finger right hand; no metal/screws in finger.   ROBOTIC ASSISTED LAPAROSCOPIC VAGINAL HYSTERECTOMY WITH FIBROID REMOVAL  02/17/12   Benign leiomyomata and benign cervix on path    Family History  Problem Relation Age of Onset   Heart disease Father        CABG in his 2s   Diabetes Father    Asthma Brother    Cancer  Paternal Grandmother        Breast; detected in her 63's at the earliest   Breast cancer Paternal Grandmother     Social History   Socioeconomic History   Marital status: Divorced    Spouse name: Not on file   Number of children: Not on file   Years of education: Not on file   Highest education level: Not on file  Occupational History   Not on file  Tobacco Use   Smoking status: Never   Smokeless tobacco: Never  Vaping Use   Vaping Use: Never used  Substance and Sexual Activity   Alcohol use: No   Drug use: No   Sexual activity: Yes  Other Topics Concern   Not on file  Social History Narrative   Married, one son.   Originally from [place].   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   No  T/A/Ds.   Has one brother and 1 sister.   Social Determinants of Health   Financial Resource Strain: Not on file  Food Insecurity: Not on file  Transportation Needs: Not on file  Physical Activity: Not on file  Stress: Not on file  Social Connections: Not on file  Intimate Partner Violence: Not on file    Outpatient Medications Prior to Visit  Medication Sig Dispense Refill   albuterol (VENTOLIN HFA) 108 (90 Base) MCG/ACT inhaler Inhale 1-2 puffs into the lungs every 4 (four) hours as needed for wheezing or shortness of breath. 1 each 0   Aspirin-Acetaminophen-Caffeine (EXCEDRIN PO) Take by mouth every morning.     cetirizine (ZYRTEC) 10 MG tablet Take 10 mg by mouth daily.     citalopram (CELEXA) 40 MG tablet Take 1 tablet (40 mg total) by mouth daily. 90 tablet 0   lisinopril (ZESTRIL) 10 MG tablet TAKE 1 TABLET (10 MG TOTAL) BY MOUTH DAILY. 90 tablet 3   Pseudoephedrine HCl (SUDAFED PO) Take by mouth as needed.     zolpidem (AMBIEN) 5 MG tablet Take 1-2 tablets (5-10 mg total) by mouth at bedtime as needed for insomnia 45 tablet 5   COVID-19 mRNA Vac-TriS, Pfizer, (PFIZER-BIONT COVID-19 VAC-TRIS) SUSP injection Inject into the muscle. 0.3 mL 0   HYDROcodone bit-homatropine (HYCODAN) 5-1.5 MG/5ML syrup 1-2 tsp po bid prn cough (Patient not taking: Reported on 11/16/2021) 120 mL 0   predniSONE (DELTASONE) 20 MG tablet 2 tabs po qd x 5d, then 1 tab po qd x 5d (Patient not taking: Reported on 11/16/2021) 15 tablet 0   No facility-administered medications prior to visit.    No Known Allergies   PE; Vitals with BMI 11/16/2021 05/21/2021 05/16/2021  Height 5\' 6"  5\' 6"  5\' 6"   Weight 174 lbs 168 lbs 6 oz 165 lbs 6 oz  BMI 28.1 75.10 25.85  Systolic 277 824 235  Diastolic 74 66 76  Pulse 66 67 60  Exam chaperoned by Deveron Furlong, CMA.  Gen: Alert, well appearing.  Patient is oriented to person, place, time, and situation. AFFECT: pleasant, lucid thought and speech. ENT: Ears: EACs  clear, normal epithelium.  TMs with good light reflex and landmarks bilaterally.  Eyes: no injection, icteris, swelling, or exudate.  EOMI, PERRLA. Nose: no drainage or turbinate edema/swelling.  No injection or focal lesion.  Mouth: lips without lesion/swelling.  Oral mucosa pink and moist.  Dentition intact and without obvious caries or gingival swelling.  Oropharynx without erythema, exudate, or swelling.  Neck: supple/nontender.  No LAD, mass, or TM.  Carotid  pulses 2+ bilaterally, without bruits. CV: RRR, no m/r/g.   LUNGS: CTA bilat, nonlabored resps, good aeration in all lung fields. ABD: soft, NT, ND, BS normal.  No hepatospenomegaly or mass.  No bruits. EXT: no clubbing, cyanosis, or edema.  Musculoskeletal: no joint swelling, erythema, warmth, or tenderness.  ROM of all joints intact. Upper extremity and lower extremity strength is 5 out of 5 proximally and distally except patient to toe rising on the left foot.  However, when I test the strength in the left ankle, foot, and toes when patient is sitting this is all normal.  Patellar and Achilles DTRs symmetric.  Thompson's test elicits normal response bilaterally. Skin - no sores or suspicious lesions or rashes or color changes  Pertinent labs:  Lab Results  Component Value Date   TSH 1.24 11/30/2020   Lab Results  Component Value Date   WBC 4.6 11/30/2020   HGB 14.2 11/30/2020   HCT 41.2 11/30/2020   MCV 90.4 11/30/2020   PLT 239 11/30/2020   Lab Results  Component Value Date   CREATININE 0.86 11/30/2020   BUN 20 11/30/2020   NA 140 11/30/2020   K 4.2 11/30/2020   CL 104 11/30/2020   CO2 25 11/30/2020   Lab Results  Component Value Date   ALT 18 11/30/2020   AST 17 11/30/2020   ALKPHOS 58 11/03/2019   BILITOT 1.1 11/30/2020   Lab Results  Component Value Date   CHOL 176 11/30/2020   Lab Results  Component Value Date   HDL 57 11/30/2020   Lab Results  Component Value Date   LDLCALC 104 (H) 11/30/2020    Lab Results  Component Value Date   TRIG 67 11/30/2020   Lab Results  Component Value Date   CHOLHDL 3.1 11/30/2020   Lab Results  Component Value Date   HGBA1C 5.7 (H) 11/30/2020   ASSESSMENT AND PLAN:   #1 chronic neck pain and low back pain.  Worsening last 2 months.  Relatively new onset of left foot plantar flexion weakness. Bedside ultrasound today showed normal left Achilles tendon and plantar fascia. Will refer her back to Dr. Arnoldo Morale for further expert evaluation and management.  #2 hypertension, well controlled on lisinopril 10 mg a day.  3.  Chronic anxiety.  Relatively stable on citalopram 40 mg a day.  Extraneous circumstances in her life lately are contributing.   #4 insomnia.  She does fairly well on Ambien 5 mg.  Will increase to 10 mg nightly.  Controlled substance contract up-to-date.  #5. Health maintenance exam: Reviewed age and gender appropriate health maintenance issues (prudent diet, regular exercise, health risks of tobacco and excessive alcohol, use of seatbelts, fire alarms in home, use of sunscreen).  Also reviewed age and gender appropriate health screening as well as vaccine recommendations. Vaccines: Flu->UTD.  Otherwise all utd. Labs: fasting HP + Hba1c (prediabetes) ordered. Cervical ca screening: uterus and cervix surgically excised-->benign.  No further cerv ca screening indicated. Breast ca screening: mammo normal 05/2021-->rpt 05/2022. Colon ca screening: next cologuard due 11/2022.  An After Visit Summary was printed and given to the patient.  FOLLOW UP:  No follow-ups on file.  Signed:  Crissie Sickles, MD           11/16/2021

## 2021-11-19 ENCOUNTER — Encounter: Payer: Self-pay | Admitting: Family Medicine

## 2021-11-21 ENCOUNTER — Other Ambulatory Visit (HOSPITAL_COMMUNITY): Payer: Self-pay

## 2021-11-29 ENCOUNTER — Other Ambulatory Visit (HOSPITAL_COMMUNITY): Payer: Self-pay

## 2021-12-13 ENCOUNTER — Other Ambulatory Visit (HOSPITAL_BASED_OUTPATIENT_CLINIC_OR_DEPARTMENT_OTHER): Payer: Self-pay

## 2021-12-13 ENCOUNTER — Ambulatory Visit: Payer: No Typology Code available for payment source | Attending: Internal Medicine

## 2021-12-13 DIAGNOSIS — Z23 Encounter for immunization: Secondary | ICD-10-CM

## 2021-12-13 MED ORDER — PFIZER COVID-19 VAC BIVALENT 30 MCG/0.3ML IM SUSP
INTRAMUSCULAR | 0 refills | Status: DC
  Filled 2021-12-13: qty 0.3, 1d supply, fill #0

## 2021-12-13 NOTE — Progress Notes (Signed)
° °  Covid-19 Vaccination Clinic  Name:  Sara Burton    MRN: [id number] DOB: 06-14-1966  12/13/2021  Sara Burton was observed post Covid-19 immunization for 15 minutes without incident. She was provided with Vaccine Information Sheet and instruction to access the V-Safe system.   Sara Burton was instructed to call 911 with any severe reactions post vaccine: Difficulty breathing  Swelling of face and throat  A fast heartbeat  A bad rash all over body  Dizziness and weakness   Immunizations Administered     Name Date Dose VIS Date Route   Pfizer Covid-19 Vaccine Bivalent Booster 12/13/2021 12:26 PM 0.3 mL 08/08/2021 Intramuscular   Manufacturer: [place]   Lot: [id number]   [place]: [phone]

## 2021-12-28 DIAGNOSIS — M542 Cervicalgia: Secondary | ICD-10-CM | POA: Insufficient documentation

## 2021-12-31 ENCOUNTER — Other Ambulatory Visit (HOSPITAL_COMMUNITY): Payer: Self-pay | Admitting: Neurosurgery

## 2021-12-31 ENCOUNTER — Other Ambulatory Visit: Payer: Self-pay | Admitting: Neurosurgery

## 2021-12-31 DIAGNOSIS — M542 Cervicalgia: Secondary | ICD-10-CM

## 2022-01-03 ENCOUNTER — Ambulatory Visit: Payer: Self-pay

## 2022-01-03 ENCOUNTER — Telehealth: Payer: No Typology Code available for payment source | Admitting: Physician Assistant

## 2022-01-03 ENCOUNTER — Ambulatory Visit: Payer: No Typology Code available for payment source | Admitting: Family Medicine

## 2022-01-03 DIAGNOSIS — J019 Acute sinusitis, unspecified: Secondary | ICD-10-CM | POA: Diagnosis not present

## 2022-01-03 DIAGNOSIS — B9689 Other specified bacterial agents as the cause of diseases classified elsewhere: Secondary | ICD-10-CM

## 2022-01-03 MED ORDER — AMOXICILLIN-POT CLAVULANATE 875-125 MG PO TABS: 1.0000 | ORAL_TABLET | Freq: Two times a day (BID) | ORAL | 0 refills | Status: DC

## 2022-01-03 NOTE — Progress Notes (Signed)

## 2022-01-03 NOTE — Progress Notes (Signed)
I have spent 5 minutes in review of e-visit questionnaire, review and updating patient chart, medical decision making and response to patient.   Keymani Mclean Cody Zamere Pasternak, PA-C    

## 2022-01-05 ENCOUNTER — Ambulatory Visit (HOSPITAL_COMMUNITY)
Admission: RE | Admit: 2022-01-05 | Discharge: 2022-01-05 | Disposition: A | Discharge status: Home / Self Care | Payer: No Typology Code available for payment source | Source: Ambulatory Visit | Attending: Neurosurgery | Admitting: Neurosurgery

## 2022-01-05 DIAGNOSIS — M542 Cervicalgia: Secondary | ICD-10-CM | POA: Diagnosis present

## 2022-01-07 ENCOUNTER — Encounter: Payer: Self-pay | Admitting: Family Medicine

## 2022-01-07 MED ORDER — HYDROCODONE BIT-HOMATROP MBR 5-1.5 MG/5ML PO SOLN: 5.0000 mL | Freq: Three times a day (TID) | ORAL | 0 refills | Status: DC | PRN

## 2022-01-07 NOTE — Telephone Encounter (Signed)
[facility] 

## 2022-01-07 NOTE — Telephone Encounter (Signed)
Okay, Hycodan syrup prescribed.

## 2022-01-07 NOTE — Telephone Encounter (Signed)
I'll eRx hycodan cough syrup but pls verify pharmacy

## 2022-01-07 NOTE — Telephone Encounter (Signed)
Please advise 

## 2022-01-08 ENCOUNTER — Other Ambulatory Visit (HOSPITAL_COMMUNITY): Payer: Self-pay

## 2022-01-10 ENCOUNTER — Other Ambulatory Visit (HOSPITAL_COMMUNITY): Payer: Self-pay

## 2022-02-13 ENCOUNTER — Other Ambulatory Visit (HOSPITAL_COMMUNITY): Payer: Self-pay

## 2022-02-14 ENCOUNTER — Other Ambulatory Visit (HOSPITAL_COMMUNITY): Payer: Self-pay

## 2022-02-28 ENCOUNTER — Encounter: Payer: Self-pay | Admitting: Family Medicine

## 2022-02-28 ENCOUNTER — Ambulatory Visit (INDEPENDENT_AMBULATORY_CARE_PROVIDER_SITE_OTHER): Payer: No Typology Code available for payment source | Admitting: Family Medicine

## 2022-02-28 ENCOUNTER — Other Ambulatory Visit: Payer: Self-pay

## 2022-02-28 VITALS — BP 126/83 | HR 64 | Temp 98.1°F | Ht 66.0 in | Wt 172.8 lb

## 2022-02-28 DIAGNOSIS — R42 Dizziness and giddiness: Secondary | ICD-10-CM | POA: Diagnosis not present

## 2022-02-28 DIAGNOSIS — R270 Ataxia, unspecified: Secondary | ICD-10-CM | POA: Diagnosis not present

## 2022-02-28 DIAGNOSIS — H538 Other visual disturbances: Secondary | ICD-10-CM

## 2022-02-28 DIAGNOSIS — R519 Headache, unspecified: Secondary | ICD-10-CM | POA: Diagnosis not present

## 2022-02-28 LAB — BASIC METABOLIC PANEL
BUN: 14 mg/dL (ref 6–23)
CO2: 30 mEq/L (ref 19–32)
Calcium: 9.1 mg/dL (ref 8.4–10.5)
Chloride: 104 mEq/L (ref 96–112)
Creatinine, Ser: 1 mg/dL (ref 0.40–1.20)
GFR: 63.86 mL/min (ref >60.00)
Glucose, Bld: 114 mg/dL — ABNORMAL HIGH (ref 70–99)
Potassium: 3.6 mEq/L (ref 3.5–5.1)
Sodium: 141 mEq/L (ref 135–145)
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
## 2022-03-06 ENCOUNTER — Encounter: Payer: Self-pay | Admitting: Family Medicine

## 2022-03-06 NOTE — Telephone Encounter (Signed)
It is great to hear you are feeling better! ?

## 2022-05-16 ENCOUNTER — Ambulatory Visit (INDEPENDENT_AMBULATORY_CARE_PROVIDER_SITE_OTHER): Payer: No Typology Code available for payment source

## 2022-05-16 ENCOUNTER — Ambulatory Visit (INDEPENDENT_AMBULATORY_CARE_PROVIDER_SITE_OTHER): Payer: No Typology Code available for payment source | Admitting: Orthopedic Surgery

## 2022-05-16 DIAGNOSIS — M205X2 Other deformities of toe(s) (acquired), left foot: Secondary | ICD-10-CM | POA: Diagnosis not present

## 2022-05-16 DIAGNOSIS — M79672 Pain in left foot: Secondary | ICD-10-CM

## 2022-05-16 DIAGNOSIS — M6702 Short Achilles tendon (acquired), left ankle: Secondary | ICD-10-CM

## 2022-05-17 ENCOUNTER — Encounter: Payer: Self-pay | Admitting: Family Medicine

## 2022-05-17 ENCOUNTER — Other Ambulatory Visit (HOSPITAL_COMMUNITY): Payer: Self-pay

## 2022-05-17 ENCOUNTER — Ambulatory Visit (INDEPENDENT_AMBULATORY_CARE_PROVIDER_SITE_OTHER): Payer: No Typology Code available for payment source | Admitting: Family Medicine

## 2022-05-17 VITALS — BP 137/83 | HR 63 | Temp 98.2°F | Wt 168.0 lb

## 2022-05-17 DIAGNOSIS — F5104 Psychophysiologic insomnia: Secondary | ICD-10-CM | POA: Diagnosis not present

## 2022-05-17 DIAGNOSIS — I10 Essential (primary) hypertension: Secondary | ICD-10-CM | POA: Diagnosis not present

## 2022-05-17 DIAGNOSIS — R42 Dizziness and giddiness: Secondary | ICD-10-CM

## 2022-05-17 DIAGNOSIS — F411 Generalized anxiety disorder: Secondary | ICD-10-CM

## 2022-05-17 MED ORDER — LISINOPRIL 20 MG PO TABS
20.0000 mg | ORAL_TABLET | Freq: Every day | ORAL | 3 refills | Status: DC
  Filled 2022-05-17: qty 90, 90d supply, fill #0
  Filled 2022-08-12: qty 90, 90d supply, fill #1
  Filled 2022-11-11: qty 90, 90d supply, fill #2

## 2022-05-17 NOTE — Progress Notes (Signed)
OFFICE VISIT  05/17/2022  CC:  Chief Complaint  Patient presents with   Anxiety   Hypertension   Insomnia    Pt is fasting   HPI:    Patient is a 56 y.o. female who presents for 66-monthfollow-up hypertension, anxiety, and insomnia.  INTERIM HX: Neck he feels well. Home blood pressure monitoring showing blood pressures low 1025Ksystolic and low 827Cdiastolic. Diet and medication compliance are good.  She feels like her anxiety is currently not a problem, nor is her sleep. She took herself off of citalopram and Ambien a couple of months ago in the context of some dizziness.  She is not sure whether these meds were related or not.  She feels like more likely she was exhausted due to [place]  PMP AWARE reviewed today: most recent rx for Ambien was filled 02/13/2022, #30, rx by me. No red flags.  Past Medical History:  Diagnosis Date   Acute urinary retention 2018   Once after hysterectomy surgery 2012, another spontaneous occurrence 11/2017--Dr. WJeffie Pollockstarted her on flomax 12/2017--much improved as of 12/22/2017 and 03/2018 urol f/u.   ALLERGIC RHINITIS 03/31/2010   Qualifier: Diagnosis of  By: WAlveta HeimlichMD, ECornelia Copa    Asthma    Cervical spondylosis 2011   w/out myelopathy; nerve block injections done 08/2010 and 10/2010 (Dr. KLetta Pate   Colon cancer screening 11/20/2019   cologuard neg.  Rpt 3 yrs   Essential hypertension    Fibroids, submucosal    u/s 07/2010   GAD (generalized anxiety disorder)    with panic attacks   Heart murmur    Pt reports this is present intermittently, says past ECHO normal.   Hyperlipemia, mixed 2022   TLC   IFG (impaired fasting glucose)    a1c 5.9% Dec 2022   MDD (major depressive disorder)    Menorrhagia    Endo ablation 02/14/11   Migraine    plus tension HA's; some occipital HA's associated with her cervicalgia   Plantar fasciitis    Shingles    x 2.  Once L rib cage,  once L ear.    Past Surgical History:  Procedure Laterality Date   ABDOMINAL HYSTERECTOMY     APPENDECTOMY  1979   CESAREAN SECTION  02/2004   ENDOMETRIAL ABLATION  02/14/11   ENDOMETRIAL BIOPSY  12/2010   Benign, with exogenous hormone effect.  No hyperplasia.   FINGER SURGERY  1982   Ring finger right hand; no metal/screws in finger.   ROBOTIC ASSISTED LAPAROSCOPIC VAGINAL HYSTERECTOMY WITH FIBROID REMOVAL  02/17/12   Benign leiomyomata and benign cervix on path    Outpatient Medications Prior to Visit  Medication Sig Dispense Refill   albuterol (VENTOLIN HFA) 108 (90 Base) MCG/ACT inhaler Inhale 1-2 puffs into the lungs every 4 (four) hours as needed for wheezing or shortness of breath. 1 each 0   Aspirin-Acetaminophen-Caffeine (EXCEDRIN PO) Take by mouth every morning.     cetirizine (ZYRTEC) 10 MG tablet Take 10 mg by mouth daily.     Pseudoephedrine HCl (SUDAFED PO) Take by mouth as needed.     lisinopril (ZESTRIL) 10 MG tablet TAKE 1 TABLET (10 MG TOTAL) BY MOUTH DAILY. 90 tablet 3   citalopram (CELEXA) 40 MG tablet Take 1 tablet (40 mg total) by mouth daily. (Patient not taking: Reported on 05/17/2022) 90 tablet 3   zolpidem (AMBIEN) 10 MG tablet  Take 1 tablet (10 mg total) by mouth at bedtime as needed for sleep. 30 tablet 5   No facility-administered medications prior to visit.    No Known Allergies  ROS As per HPI  PE:    05/17/2022    9:04 AM 02/28/2022    1:16 PM 11/16/2021    8:18 AM  Vitals with BMI  Height  '5\' 6"'$  '5\' 6"'$   Weight 168 lbs 172 lbs 13 oz 174 lbs  BMI  29.9 24.2  Systolic 683 419 622  Diastolic 83 83 74  Pulse 63 64 66     Physical Exam  Gen: Alert, well appearing.  Patient is oriented to person, place, time, and situation. AFFECT: pleasant, lucid thought and speech. CV: RRR, no m/r/g.   LUNGS: CTA bilat, nonlabored resps, good aeration in all lung fields. EXT: no clubbing or cyanosis.  no edema.    LABS:  Last CBC Lab Results  Component  Value Date   WBC 4.9 11/16/2021   HGB 13.7 11/16/2021   HCT 41.3 11/16/2021   MCV 95.7 11/16/2021   MCH 31.1 11/30/2020   RDW 13.3 11/16/2021   PLT 231.0 29/79/8921   Last metabolic panel Lab Results  Component Value Date   GLUCOSE 114 (H) 02/28/2022   NA 141 02/28/2022   K 3.6 02/28/2022   CL 104 02/28/2022   CO2 30 02/28/2022   BUN 14 02/28/2022   CREATININE 1.00 02/28/2022   GFRNONAA 79 06/03/2018   CALCIUM 9.1 02/28/2022   PROT 6.4 11/16/2021   ALBUMIN 4.3 11/16/2021   LABGLOB 2.1 06/03/2018   AGRATIO 2.1 06/03/2018   BILITOT 1.2 11/16/2021   ALKPHOS 57 11/16/2021   AST 19 11/16/2021   ALT 21 11/16/2021   Last lipids Lab Results  Component Value Date   CHOL 188 11/16/2021   HDL 47.90 11/16/2021   LDLCALC 104 (H) 11/30/2020   LDLDIRECT 119.0 11/16/2021   TRIG 261.0 (H) 11/16/2021   CHOLHDL 4 11/16/2021   Last hemoglobin A1c Lab Results  Component Value Date   HGBA1C 5.9 11/16/2021   Last thyroid functions Lab Results  Component Value Date   TSH 1.50 11/16/2021   IMPRESSION AND PLAN:  #1 hypertension, not ideal control. Increase lisinopril to 20 mg a day. Kidney function and potassium have been normal. Plan repeat basic metabolic panel when I see her for follow-up in 2 weeks.  #2 history of anxiety and insomnia.  Has been doing well the last couple of months off of citalopram and Ambien. Continue off of these meds.  #3 dizziness.  Resolved.  Unknown etiology but most likely exhaustion-related.  #4 left calf weakness.  It is apparent on exam that her left calf has some atrophy. She saw Dr. Sharol Given with orthopedics yesterday and patient states she was told she has Achilles weakness. She has no pain.  She is working on Hotel manager exercises.  An After Visit Summary was printed and given to the patient.  FOLLOW UP: Return in about 2 weeks (around 05/31/2022) for f/u htn.  Signed:  Crissie Sickles, MD           05/17/2022

## 2022-05-21 ENCOUNTER — Telehealth: Payer: No Typology Code available for payment source | Admitting: Family Medicine

## 2022-05-21 ENCOUNTER — Other Ambulatory Visit (HOSPITAL_COMMUNITY): Payer: Self-pay

## 2022-05-21 DIAGNOSIS — J069 Acute upper respiratory infection, unspecified: Secondary | ICD-10-CM

## 2022-05-21 MED ORDER — BENZONATATE 100 MG PO CAPS
100.0000 mg | ORAL_CAPSULE | Freq: Two times a day (BID) | ORAL | 0 refills | Status: DC | PRN
  Filled 2022-05-21: qty 20, 10d supply, fill #0

## 2022-05-21 MED ORDER — FLUTICASONE PROPIONATE 50 MCG/ACT NA SUSP
2.0000 | Freq: Every day | NASAL | 0 refills | Status: AC
  Filled 2022-05-21: qty 16, 30d supply, fill #0

## 2022-05-21 NOTE — Progress Notes (Signed)

## 2022-05-22 ENCOUNTER — Encounter: Payer: Self-pay | Admitting: Family Medicine

## 2022-05-22 ENCOUNTER — Ambulatory Visit (INDEPENDENT_AMBULATORY_CARE_PROVIDER_SITE_OTHER): Payer: No Typology Code available for payment source | Admitting: Family Medicine

## 2022-05-22 VITALS — BP 120/71 | HR 78 | Temp 98.6°F | Ht 66.0 in | Wt 166.4 lb

## 2022-05-22 DIAGNOSIS — J019 Acute sinusitis, unspecified: Secondary | ICD-10-CM

## 2022-05-22 DIAGNOSIS — J069 Acute upper respiratory infection, unspecified: Secondary | ICD-10-CM

## 2022-05-22 MED ORDER — PREDNISONE 20 MG PO TABS: ORAL_TABLET | ORAL | 0 refills | Status: DC

## 2022-05-22 MED ORDER — AMOXICILLIN 875 MG PO TABS: 875.0000 mg | ORAL_TABLET | Freq: Two times a day (BID) | ORAL | 0 refills | Status: DC

## 2022-05-22 MED ORDER — HYDROCODONE BIT-HOMATROP MBR 5-1.5 MG/5ML PO SOLN: ORAL | 0 refills | Status: DC

## 2022-05-22 NOTE — Progress Notes (Signed)
OFFICE VISIT  05/22/2022  CC:  Chief Complaint  Patient presents with   Cough    Severe; given tessalon capsules 6/13 (20,0). She is also using Sudafed and Nightquil since Sunday. Mucinex use starting yesterday. Unable to keep anything down since yesterday without vomiting.   Nasal Congestion    A lot of sinus pressure; headache, body aches. Tested for covid last night; negative results    Patient is a 56 y.o. female who presents for cough.  HPI: Onset 4 days ago nasal congestion/runny nose, postnasal drip, bad cough, sore throat.  Sore throat resolved but all the other symptoms remain.  In addition, she has face pain and headache and body aches. No rash.  No fever.  No shortness of breath or wheezing. Mucinex, Tessalon Perles, and Sudafed no help.  Taking Excedrin and Tylenol regularly. No nausea, vomiting, or diarrhea. No known sick contacts Home COVID test negative last night.      Past Medical History:  Diagnosis Date   Acute urinary retention 2018   Once after hysterectomy surgery 2012, another spontaneous occurrence 11/2017--Dr. Jeffie Pollock started her on flomax 12/2017--much improved as of 12/22/2017 and 03/2018 urol f/u.   ALLERGIC RHINITIS 03/31/2010   Qualifier: Diagnosis of  By: Alveta Heimlich MD, Cornelia Copa     Asthma    Cervical spondylosis 2011   w/out myelopathy; nerve block injections done 08/2010 and 10/2010 (Dr. Letta Pate)   Colon cancer screening 11/20/2019   cologuard neg.  Rpt 3 yrs   Essential hypertension    Fibroids, submucosal    u/s 07/2010   GAD (generalized anxiety disorder)    with panic attacks   Heart murmur    Pt reports this is present intermittently, says past ECHO normal.   Hyperlipemia, mixed 2022   TLC   IFG (impaired fasting glucose)    a1c 5.9% Dec 2022   MDD (major depressive disorder)    Menorrhagia    Endo ablation 02/14/11   Migraine    plus tension HA's; some occipital HA's associated with her cervicalgia   Plantar fasciitis    Shingles    x 2.   Once L rib cage, once L ear.    Past Surgical History:  Procedure Laterality Date   ABDOMINAL HYSTERECTOMY     APPENDECTOMY  1979   CESAREAN SECTION  02/2004   ENDOMETRIAL ABLATION  02/14/11   ENDOMETRIAL BIOPSY  12/2010   Benign, with exogenous hormone effect.  No hyperplasia.   FINGER SURGERY  1982   Ring finger right hand; no metal/screws in finger.   ROBOTIC ASSISTED LAPAROSCOPIC VAGINAL HYSTERECTOMY WITH FIBROID REMOVAL  02/17/12   Benign leiomyomata and benign cervix on path    Outpatient Medications Prior to Visit  Medication Sig Dispense Refill   Aspirin-Acetaminophen-Caffeine (EXCEDRIN PO) Take by mouth every morning.     benzonatate (TESSALON) 100 MG capsule Take 1 capsule (100 mg total) by mouth 2 (two) times daily as needed for cough. 20 capsule 0   cetirizine (ZYRTEC) 10 MG tablet Take 10 mg by mouth daily.     fluticasone (FLONASE) 50 MCG/ACT nasal spray Place 2 sprays into both nostrils daily. 16 g 0   lisinopril (ZESTRIL) 20 MG tablet Take 1 tablet (20 mg total) by mouth daily. 90 tablet 3   Pseudoephedrine HCl (SUDAFED PO) Take by mouth as needed.     albuterol (VENTOLIN HFA) 108 (90 Base) MCG/ACT inhaler Inhale 1-2 puffs into the lungs every 4 (four) hours as needed for wheezing or shortness  of breath. (Patient not taking: Reported on 05/22/2022) 1 each 0   No facility-administered medications prior to visit.    No Known Allergies  ROS As per HPI  PE:    05/22/2022    4:00 PM 05/17/2022    9:04 AM 02/28/2022    1:16 PM  Vitals with BMI  Height '5\' 6"'$   '5\' 6"'$   Weight 166 lbs 6 oz 168 lbs 172 lbs 13 oz  BMI 37.90  24.0  Systolic 973 532 992  Diastolic 71 83 83  Pulse 78 63 64     Physical Exam  VS: noted--normal. Gen: alert, coughing regularly, tired but NONTOXIC APPEARING. HEENT: eyes without injection, drainage, or swelling.  Ears: EACs clear, TMs with normal light reflex and landmarks.  Nose: Clear rhinorrhea, with some dried, crusty exudate adherent to  mildly injected mucosa.  No purulent d/c.  No paranasal sinus TTP.  No facial swelling.  Throat and mouth without focal lesion.  No pharyngial swelling, erythema, or exudate.   Neck: supple, no LAD.   LUNGS: CTA bilat, nonlabored resps.   CV: RRR, no m/r/g. EXT: no c/c/e SKIN: no rash  LABS:  Last CBC Lab Results  Component Value Date   WBC 4.9 11/16/2021   HGB 13.7 11/16/2021   HCT 41.3 11/16/2021   MCV 95.7 11/16/2021   MCH 31.1 11/30/2020   RDW 13.3 11/16/2021   PLT 231.0 42/68/3419   Last metabolic panel Lab Results  Component Value Date   GLUCOSE 114 (H) 02/28/2022   NA 141 02/28/2022   K 3.6 02/28/2022   CL 104 02/28/2022   CO2 30 02/28/2022   BUN 14 02/28/2022   CREATININE 1.00 02/28/2022   GFRNONAA 79 06/03/2018   CALCIUM 9.1 02/28/2022   PROT 6.4 11/16/2021   ALBUMIN 4.3 11/16/2021   LABGLOB 2.1 06/03/2018   AGRATIO 2.1 06/03/2018   BILITOT 1.2 11/16/2021   ALKPHOS 57 11/16/2021   AST 19 11/16/2021   ALT 21 11/16/2021   IMPRESSION AND PLAN:  Influenza-like illness. Question bacterial sinusitis. History of intermittent asthma.  Plan: Prednisone 40 mg a day x5 days, amoxicillin 875 twice daily x10 days, Hycodan syrup 1 to 2 teaspoon twice daily as needed (#145m). Stop Tessalon, Sudafed, and Mucinex.  An After Visit Summary was printed and given to the patient.  FOLLOW UP: No follow-ups on file.  Signed:  PCrissie Sickles MD           05/22/2022

## 2022-05-23 ENCOUNTER — Telehealth: Payer: Self-pay

## 2022-05-23 MED ORDER — HYDROCODONE BIT-HOMATROP MBR 5-1.5 MG/5ML PO SOLN
ORAL | 0 refills | Status: DC
Start: 2022-05-23 — End: 2022-05-31

## 2022-05-23 NOTE — Telephone Encounter (Signed)
Please review and advise. New rx pending

## 2022-05-23 NOTE — Telephone Encounter (Signed)
Pt advised med resent.

## 2022-05-23 NOTE — Telephone Encounter (Signed)
Patient seen Dr. Anitra Lauth yesterday.  [facility] does not have cough med in stock and does know when they will have in stock.  Patient has checked with [facility], and they DO have medication in stock.  Please call cough med only to [facility]. HYDROcodone bit-homatropine (HYCODAN) 5-1.5 MG/5ML syrup [[id number]

## 2022-05-23 NOTE — Telephone Encounter (Signed)
Ok, done 

## 2022-05-24 ENCOUNTER — Encounter: Payer: Self-pay | Admitting: Orthopedic Surgery

## 2022-05-24 NOTE — Progress Notes (Signed)
Office Visit Note   Patient: Sara Burton           Date of Birth: [birth year]           MRN: [id number] Visit Date: 05/16/2022              Requested by: Tammi Sou, MD [address] [place],  [place] [address] PCP: Tammi Sou, MD  Chief Complaint  Patient presents with   Left Foot - Pain      HPI: Patient is a 56 year old woman who presents with left forefoot pain.  She states she cannot stand on the tips of her toes she complains of her toes clawing.  Patient states that this has gotten worse since she tripped in October.  Assessment & Plan: Visit Diagnoses:  1. Pain in left foot   2. Achilles tendon contracture, left   3. Claw toe, acquired, left     Plan: Recommended Achilles stretching sole orthotics stiff soled shoe.  Follow-Up Instructions: Return if symptoms worsen or fail to improve.   Ortho Exam  Patient is alert, oriented, no adenopathy, well-dressed, normal affect, normal respiratory effort. Examination patient has a good pulse she can do a single limb heel raise with good posterior tibial tendon function.  With her knee extended she has dorsiflexion only to neutral with Achilles contracture.  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  Imaging: No results found. No images are attached to the encounter.  Labs: Lab Results  Component Value Date   HGBA1C 5.9 11/16/2021   HGBA1C 5.7 (H) 11/30/2020   HGBA1C 5.8 05/25/2020   ESRSEDRATE 2 12/03/2017   CRP 1.0 12/03/2017   LABORGA Normal Upper Respiratory Flora 11/18/2014   LABORGA No Beta Hemolytic Streptococci Isolated 11/18/2014     Lab Results  Component Value Date   ALBUMIN 4.3 11/16/2021   ALBUMIN 4.4 11/03/2019   ALBUMIN 4.4 06/03/2018    No results found for: "MG" No results found for: "VD25OH"  No results found for: "PREALBUMIN"    Latest Ref Rng & Units 11/16/2021    8:57 AM 11/30/2020    8:55 AM 11/03/2019   10:35 AM  CBC EXTENDED  WBC  4.0 - 10.5 K/uL 4.9  4.6  5.5   RBC 3.87 - 5.11 Mil/uL 4.31  4.56  4.68   Hemoglobin 12.0 - 15.0 g/dL 13.7  14.2  14.6   HCT 36.0 - 46.0 % 41.3  41.2  43.5   Platelets 150.0 - 400.0 K/uL 231.0  239  243.0   NEUT# 1.4 - 7.7 K/uL 2.5  2,314  3.5   Lymph# 0.7 - 4.0 K/uL 1.6  1,757  1.5      There is no height or weight on file to calculate BMI.  Orders:  Orders Placed This Encounter  Procedures   XR Foot 2 Views Left   No orders of the defined types were placed in this encounter.    Procedures: No procedures performed  Clinical Data: No additional findings.  ROS:  All other systems negative, except as noted in the HPI. Review of Systems  Objective: Vital Signs: LMP 01/24/2012   Specialty Comments:  No specialty comments available.  PMFS History: Patient Active Problem List   Diagnosis Date Noted   Neck pain 12/28/2021   GAD (generalized anxiety disorder) 05/18/2014   Panic attacks 05/18/2014   Health maintenance examination 05/10/2013   Past Medical History:  Diagnosis Date  Acute urinary retention 2018   Once after hysterectomy surgery 2012, another spontaneous occurrence 11/2017--Dr. Jeffie Pollock started her on flomax 12/2017--much improved as of 12/22/2017 and 03/2018 urol f/u.   ALLERGIC RHINITIS 03/31/2010   Qualifier: Diagnosis of  By: Alveta Heimlich MD, Cornelia Copa     Asthma    Cervical spondylosis 2011   w/out myelopathy; nerve block injections done 08/2010 and 10/2010 (Dr. Letta Pate)   Colon cancer screening 11/20/2019   cologuard neg.  Rpt 3 yrs   Essential hypertension    Fibroids, submucosal    u/s 07/2010   GAD (generalized anxiety disorder)    with panic attacks   Heart murmur    Pt reports this is present intermittently, says past ECHO normal.   Hyperlipemia, mixed 2022   TLC   IFG (impaired fasting glucose)    a1c 5.9% Dec 2022   MDD (major depressive disorder)    Menorrhagia    Endo ablation 02/14/11   Migraine    plus tension HA's; some occipital HA's  associated with her cervicalgia   Plantar fasciitis    Shingles    x 2.  Once L rib cage, once L ear.    Family History  Problem Relation Age of Onset   Heart disease Father        CABG in his 22s   Diabetes Father    Asthma Brother    Cancer Paternal Grandmother        Breast; detected in her 57's at the earliest   Breast cancer Paternal Grandmother     Past Surgical History:  Procedure Laterality Date   ABDOMINAL HYSTERECTOMY     APPENDECTOMY  1979   CESAREAN SECTION  02/2004   ENDOMETRIAL ABLATION  02/14/11   ENDOMETRIAL BIOPSY  12/2010   Benign, with exogenous hormone effect.  No hyperplasia.   FINGER SURGERY  1982   Ring finger right hand; no metal/screws in finger.   ROBOTIC ASSISTED LAPAROSCOPIC VAGINAL HYSTERECTOMY WITH FIBROID REMOVAL  02/17/12   Benign leiomyomata and benign cervix on path   Social History   Occupational History   Not on file  Tobacco Use   Smoking status: Never   Smokeless tobacco: Never  Vaping Use   Vaping Use: Never used  Substance and Sexual Activity   Alcohol use: No   Drug use: No   Sexual activity: Yes

## 2022-05-31 ENCOUNTER — Ambulatory Visit (INDEPENDENT_AMBULATORY_CARE_PROVIDER_SITE_OTHER): Payer: No Typology Code available for payment source | Admitting: Family Medicine

## 2022-05-31 ENCOUNTER — Encounter: Payer: Self-pay | Admitting: Family Medicine

## 2022-05-31 VITALS — BP 80/50 | HR 69 | Temp 98.1°F | Ht 66.0 in | Wt 164.6 lb

## 2022-05-31 DIAGNOSIS — I10 Essential (primary) hypertension: Secondary | ICD-10-CM | POA: Diagnosis not present

## 2022-05-31 DIAGNOSIS — I952 Hypotension due to drugs: Secondary | ICD-10-CM

## 2022-05-31 LAB — BASIC METABOLIC PANEL
BUN: 19 mg/dL (ref 6–23)
CO2: 27 mEq/L (ref 19–32)
Calcium: 9.3 mg/dL (ref 8.4–10.5)
Chloride: 103 mEq/L (ref 96–112)
Creatinine, Ser: 0.82 mg/dL (ref 0.40–1.20)
GFR: 80.88 mL/min (ref >60.00)
Glucose, Bld: 117 mg/dL — ABNORMAL HIGH (ref 70–99)
Potassium: 4.1 mEq/L (ref 3.5–5.1)
Sodium: 140 mEq/L (ref 135–145)

## 2022-06-03 IMAGING — MR MR CERVICAL SPINE W/O CM
6 series · 35 of 48 positions shown · non-contrast
Comparison: MR cervical 01/18/2010.

CLINICAL DATA: Cervicalgia for last 2 months.

EXAM:
MRI CERVICAL SPINE WITHOUT CONTRAST
TECHNIQUE: Multiplanar, multisequence MR imaging of the cervical spine was
performed. No intravenous contrast was administered.

[Series 5: T1 · sagittal · 3.0mm · 0.69mm/px · 6 of 15 slices shown (1 of 2)]
[im 1/15]
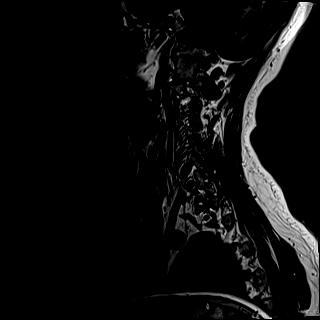
[im 3/15]
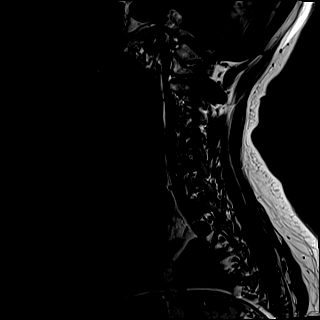
[im 6/15]
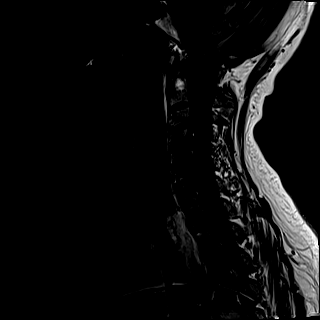
[im 9/15]
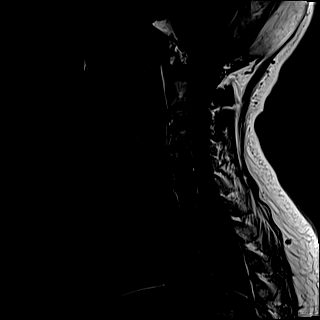
[im 12/15]
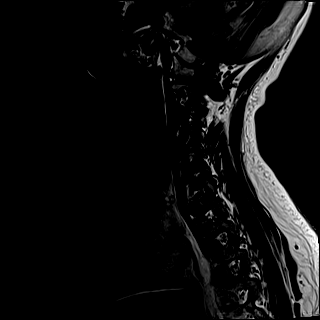
[im 15/15]
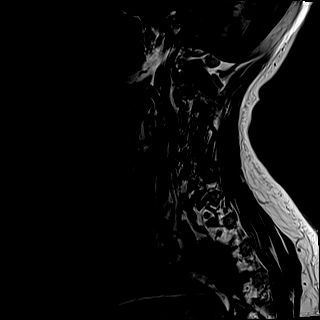

[Series 6: T2 · sagittal · 3.0mm · 0.69mm/px · 6 of 15 slices shown (1 of 2)]
[im 1/15]
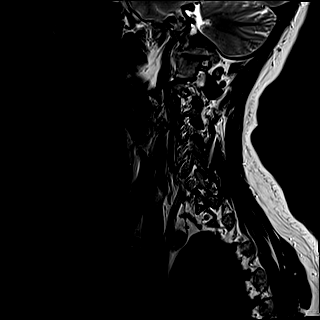
[im 3/15]
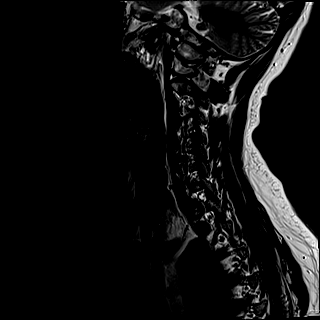
[im 6/15]
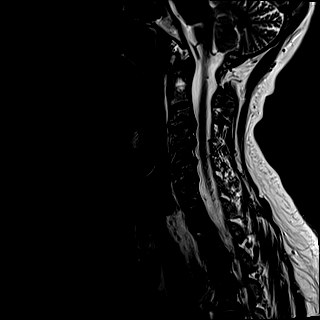
[im 9/15]
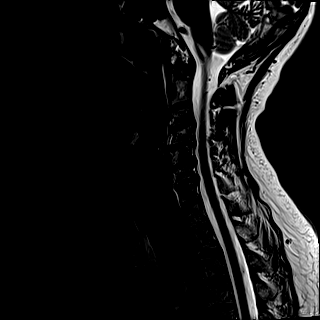
[im 12/15]
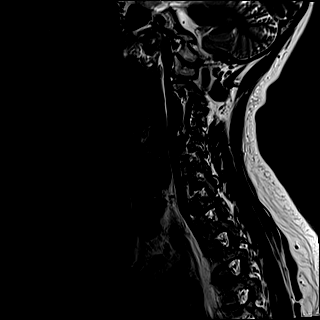
[im 15/15]
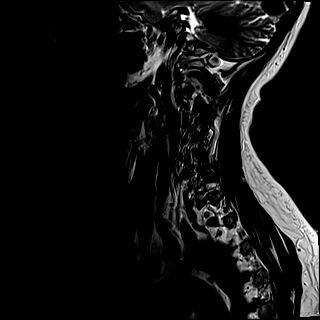

[Series 7: STIR · sagittal · 3.0mm · 0.86mm/px · 6 of 15 slices shown]
[im 1/15]
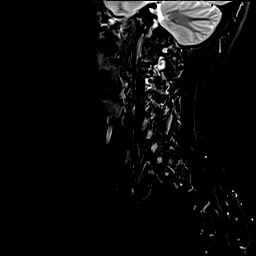
[im 3/15]
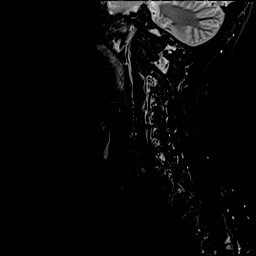
[im 6/15]
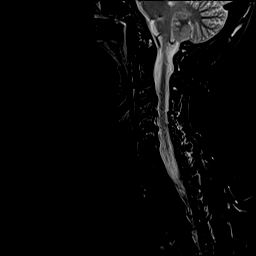
[im 9/15]
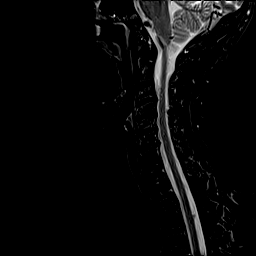
[im 12/15]
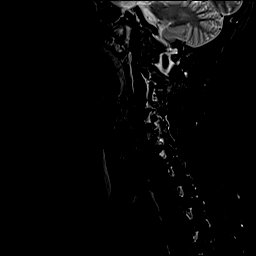
[im 15/15]
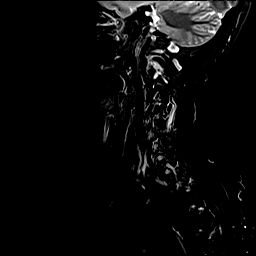

[Series 8: T2 · axial · 3.0mm · 0.70mm/px · z∈[-78,+11]mm · 8 of 27 slices shown (2 of 2)]
[im 1/27]
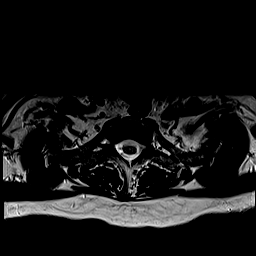
[im 3/27]
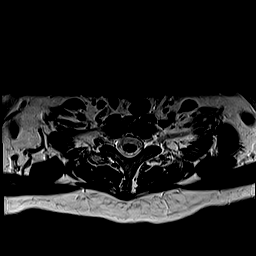
[im 9/27]
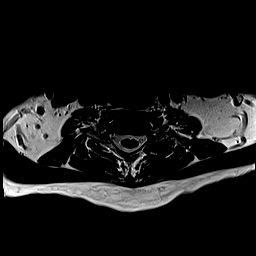
[im 12/27]
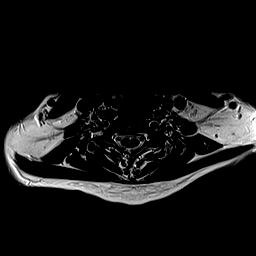
[im 15/27]
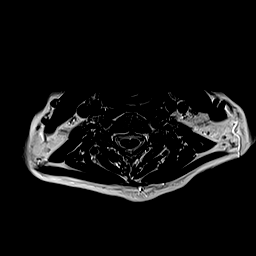
[im 18/27]
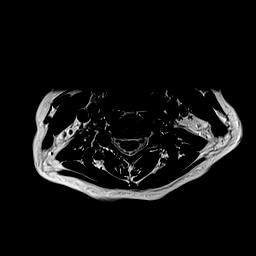
[im 24/27]
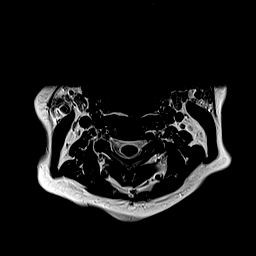
[im 27/27]
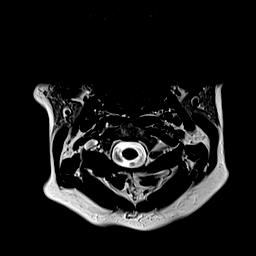

[Series 9: GRE · axial · 3.0mm · 0.35mm/px · 1 of 27 slices shown]
[im 1/27]
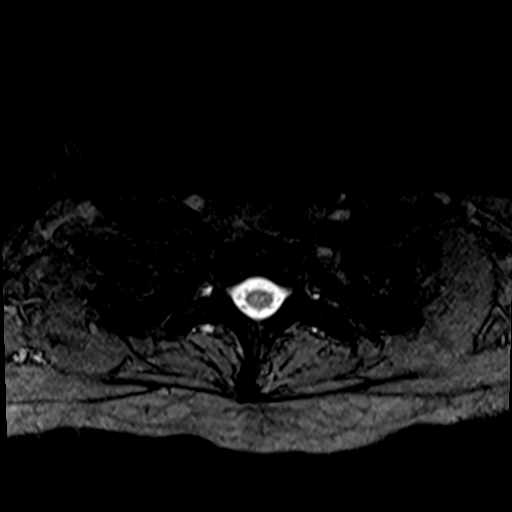

[Series 10: T1 · axial · 3.0mm · 0.35mm/px · z∈[-78,+11]mm · 8 of 27 slices shown (2 of 2)]
[im 1/27]
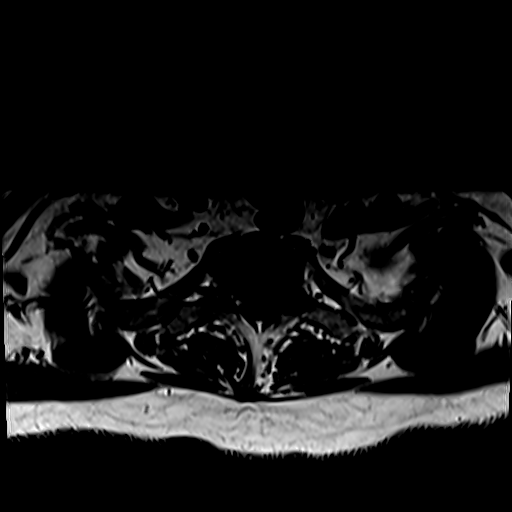
[im 3/27]
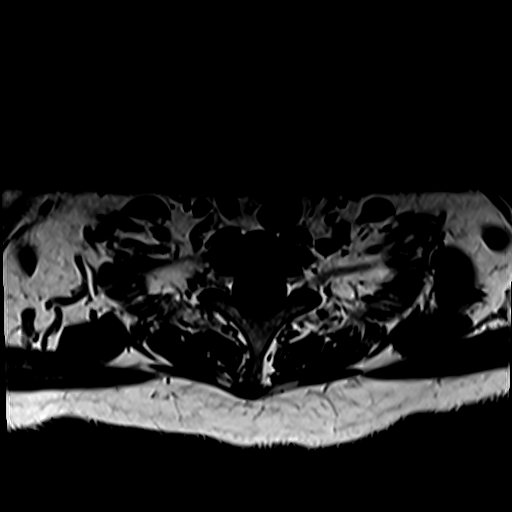
[im 9/27]
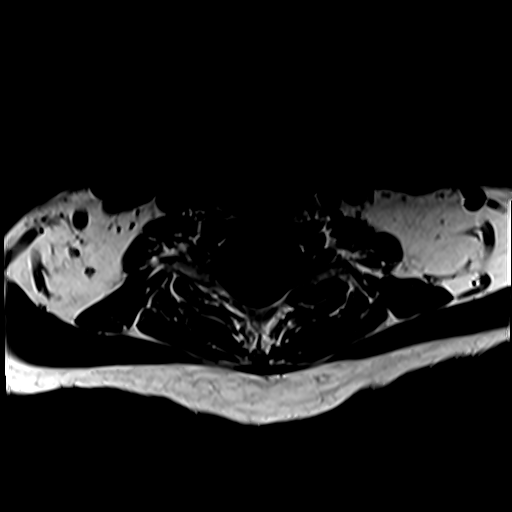
[im 12/27]
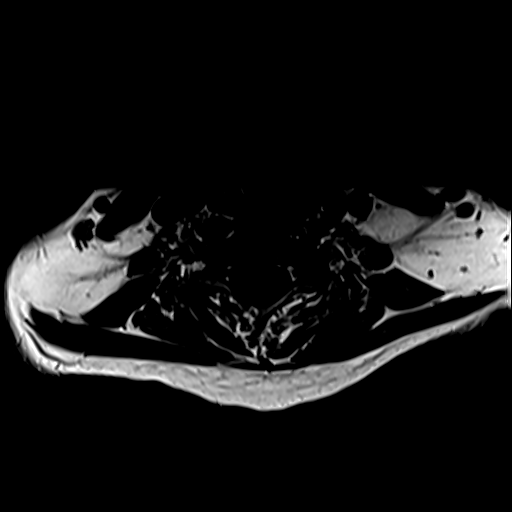
[im 15/27]
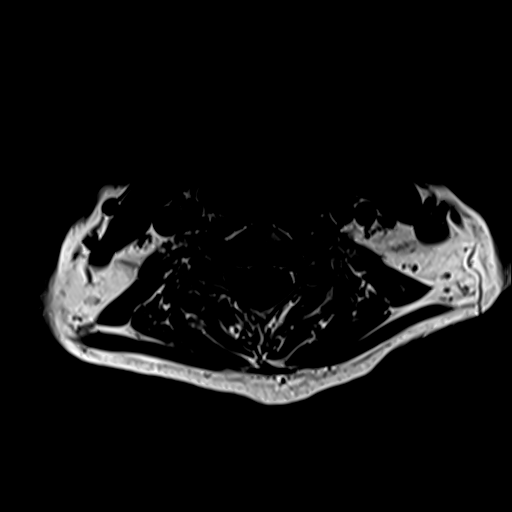
[im 18/27]
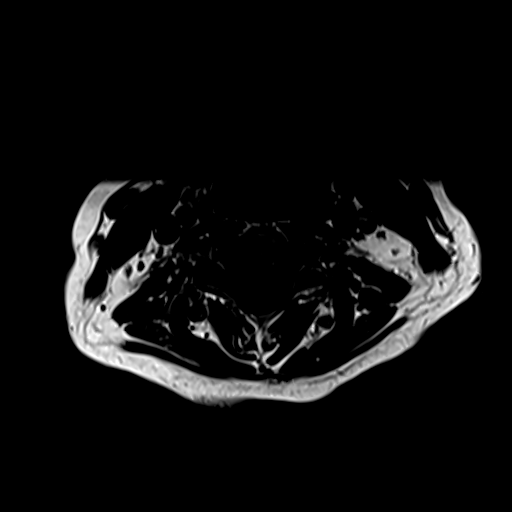
[im 24/27]
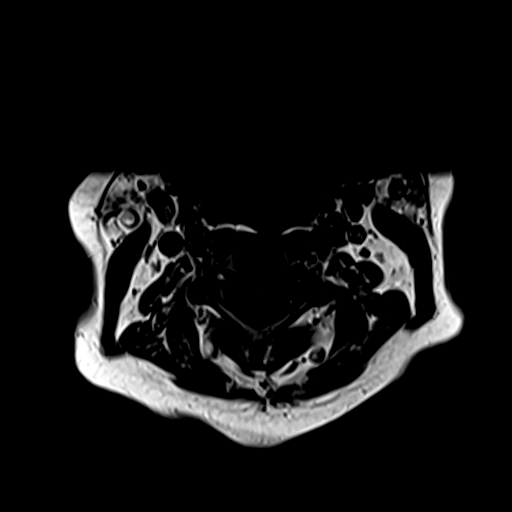
[im 27/27]
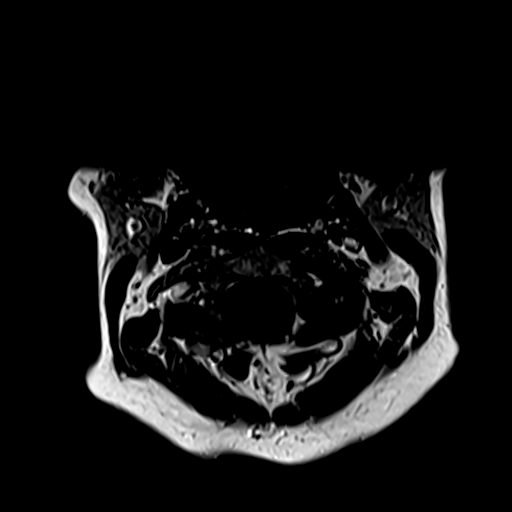

[35 of 48 positions shown; findings below may reference images not displayed]

FINDINGS: Alignment: Physiologic

Vertebrae: No fracture, evidence of discitis, or bone lesion.

Cord: Normal signal and morphology.

Posterior Fossa, vertebral arteries, paraspinal tissues: Negative.

Disc levels: The craniocervical junction is unremarkable.

C2-C3: No significant spinal canal or neural foraminal narrowing.

C3-C4: Uncinate spurring with mild disc bulging and bilateral facet
arthropathy. Mild spinal canal stenosis. Mild left neural foraminal
stenosis.

C4-C5: Uncinate spurring with mild disc bulging and bilateral facet
arthropathy. Mild spinal canal stenosis. Mild bilateral neural
foraminal stenosis.

C5-C6: Uncinate spurring and mild disc bulging. Mild bilateral facet
arthropathy. Minimal canal narrowing. Mild bilateral neural
foraminal stenosis.

C6-C7: Uncinate spurring and eccentric left disc bulging, and
bilateral facet arthropathy. No significant spinal canal stenosis.
Mild bilateral neural foraminal stenosis.

C7-T1: No significant spinal canal or neural foraminal narrowing.
IMPRESSION: Multilevel degenerative changes most prominent from C3-C7.

Mild spinal canal stenosis at C3-C4 and C4-C5. Mild left neural
foraminal stenosis at C3-C4.

Mild bilateral neural foraminal stenosis at C4-C5, C5-C6, and C6-C7.

## 2022-06-14 ENCOUNTER — Ambulatory Visit: Payer: No Typology Code available for payment source | Admitting: Family Medicine

## 2022-06-19 ENCOUNTER — Ambulatory Visit (INDEPENDENT_AMBULATORY_CARE_PROVIDER_SITE_OTHER): Payer: No Typology Code available for payment source | Admitting: Family Medicine

## 2022-06-19 ENCOUNTER — Encounter: Payer: Self-pay | Admitting: Family Medicine

## 2022-06-19 VITALS — BP 110/71 | HR 64 | Temp 98.3°F | Ht 66.0 in | Wt 165.0 lb

## 2022-06-19 DIAGNOSIS — I10 Essential (primary) hypertension: Secondary | ICD-10-CM

## 2022-06-19 DIAGNOSIS — R7301 Impaired fasting glucose: Secondary | ICD-10-CM

## 2022-06-19 LAB — POCT GLYCOSYLATED HEMOGLOBIN (HGB A1C)
HbA1c POC (<> result, manual entry): 5.8 % (ref 4.0–5.6)
HbA1c, POC (controlled diabetic range): 5.8 % (ref 0.0–7.0)
HbA1c, POC (prediabetic range): 5.8 % (ref 5.7–6.4)
Hemoglobin A1C: 5.8 % — AB (ref 4.0–5.6)

## 2022-06-19 LAB — GLUCOSE, POCT (MANUAL RESULT ENTRY): POC Glucose: 122 mg/dl — AB (ref 70–99)

## 2022-06-19 NOTE — Progress Notes (Signed)
OFFICE VISIT  06/19/2022  CC:  Chief Complaint  Patient presents with   Hypertension    Pt is fasting   HPI:    Patient is a 56 y.o. female who presents for 2-week follow-up hypertension. A/P as of last visit: "#1 hypertension, low blood pressure today but patient asymptomatic. Blood pressures at home have been in 120s over 70s after we had gone up on lisinopril to 20 mg daily.  In fact, 1 day when she was ill she missed her dose of lisinopril and it went up to the 150s over 80a.  Today is her first low blood pressure measurement. Discussed options today and through shared decision making decided to continue with the current dose and monitor blood pressure a few times a day for the next few days.  If systolic consistently less than 100 then I recommended she cut her lisinopril in half daily.  She will send me blood pressure updates through [facility].  We will see her back in the office for recheck in 2 weeks. Basic metabolic panel today.   #2 influenza-like illness, resolving appropriately. She is status post a prednisone burst as well as a round of antibiotics. Continue albuterol every 6 hours as needed."  INTERIM HX: Jocelyn Lamer feels well. Home blood pressures consistently less than 130/80.  No hypotension.  Labs all normal last visit except fasting glucose was 117.  Past Medical History:  Diagnosis Date   Acute urinary retention 2018   Once after hysterectomy surgery 2012, another spontaneous occurrence 11/2017--Dr. Jeffie Pollock started her on flomax 12/2017--much improved as of 12/22/2017 and 03/2018 urol f/u.   ALLERGIC RHINITIS 03/31/2010   Qualifier: Diagnosis of  By: Alveta Heimlich MD, Cornelia Copa     Asthma    Cervical spondylosis 2011   w/out myelopathy; nerve block injections done 08/2010 and 10/2010 (Dr. Letta Pate)   Colon cancer screening 11/20/2019   cologuard neg.  Rpt 3 yrs   Essential hypertension    Fibroids, submucosal    u/s 07/2010   GAD (generalized anxiety disorder)    with panic  attacks   Heart murmur    Pt reports this is present intermittently, says past ECHO normal.   Hyperlipemia, mixed 2022   TLC   IFG (impaired fasting glucose)    a1c 5.9% Dec 2022   MDD (major depressive disorder)    Menorrhagia    Endo ablation 02/14/11   Migraine    plus tension HA's; some occipital HA's associated with her cervicalgia   Plantar fasciitis    Shingles    x 2.  Once L rib cage, once L ear.    Past Surgical History:  Procedure Laterality Date   ABDOMINAL HYSTERECTOMY     APPENDECTOMY  1979   CESAREAN SECTION  02/2004   ENDOMETRIAL ABLATION  02/14/11   ENDOMETRIAL BIOPSY  12/2010   Benign, with exogenous hormone effect.  No hyperplasia.   FINGER SURGERY  1982   Ring finger right hand; no metal/screws in finger.   ROBOTIC ASSISTED LAPAROSCOPIC VAGINAL HYSTERECTOMY WITH FIBROID REMOVAL  02/17/12   Benign leiomyomata and benign cervix on path    Outpatient Medications Prior to Visit  Medication Sig Dispense Refill   Aspirin-Acetaminophen-Caffeine (EXCEDRIN PO) Take by mouth every morning.     cetirizine (ZYRTEC) 10 MG tablet Take 10 mg by mouth daily.     lisinopril (ZESTRIL) 20 MG tablet Take 1 tablet (20 mg total) by mouth daily. 90 tablet 3   albuterol (VENTOLIN HFA) 108 (90 Base) MCG/ACT  inhaler Inhale 1-2 puffs into the lungs every 4 (four) hours as needed for wheezing or shortness of breath. (Patient not taking: Reported on 05/22/2022) 1 each 0   fluticasone (FLONASE) 50 MCG/ACT nasal spray Place 2 sprays into both nostrils daily. (Patient not taking: Reported on 06/19/2022) 16 g 0   No facility-administered medications prior to visit.    No Known Allergies  ROS As per HPI  PE:    06/19/2022    8:56 AM 05/31/2022    9:34 AM 05/31/2022    9:05 AM  Vitals with BMI  Height '5\' 6"'$   '5\' 6"'$   Weight 165 lbs  164 lbs 10 oz  BMI 78.24  23.53  Systolic 614 80 88  Diastolic 71 50 48  Pulse 64  69     Physical Exam  Gen: Alert, well appearing.  Patient is  oriented to person, place, time, and situation. AFFECT: pleasant, lucid thought and speech. No further exam today.  LABS:  Last CBC Lab Results  Component Value Date   WBC 4.9 11/16/2021   HGB 13.7 11/16/2021   HCT 41.3 11/16/2021   MCV 95.7 11/16/2021   MCH 31.1 11/30/2020   RDW 13.3 11/16/2021   PLT 231.0 43/15/4008   Last metabolic panel Lab Results  Component Value Date   GLUCOSE 117 (H) 05/31/2022   NA 140 05/31/2022   K 4.1 05/31/2022   CL 103 05/31/2022   CO2 27 05/31/2022   BUN 19 05/31/2022   CREATININE 0.82 05/31/2022   GFRNONAA 79 06/03/2018   CALCIUM 9.3 05/31/2022   PROT 6.4 11/16/2021   ALBUMIN 4.3 11/16/2021   LABGLOB 2.1 06/03/2018   AGRATIO 2.1 06/03/2018   BILITOT 1.2 11/16/2021   ALKPHOS 57 11/16/2021   AST 19 11/16/2021   ALT 21 11/16/2021   Last thyroid functions Lab Results  Component Value Date   TSH 1.50 11/16/2021   Lab Results  Component Value Date   HGBA1C 5.8 (A) 06/19/2022   HGBA1C 5.8 06/19/2022   HGBA1C 5.8 06/19/2022   HGBA1C 5.8 06/19/2022   IMPRESSION AND PLAN:  #1 hypertension, well controlled on lisinopril 20 mg a day. No changes today.  #2 Impaired fasting glucose/prediabetes: stable Hba1c today-->5.8%.  Fasting glucose today 122. Repeat these in 6 mo. Cont diet/exercise.  An After Visit Summary was printed and given to the patient.  FOLLOW UP: Return in about 6 months (around 12/20/2022) for annual CPE (fasting).  Signed:  Crissie Sickles, MD           06/19/2022

## 2022-07-18 ENCOUNTER — Other Ambulatory Visit: Payer: Self-pay | Admitting: Family Medicine

## 2022-07-18 DIAGNOSIS — Z1231 Encounter for screening mammogram for malignant neoplasm of breast: Secondary | ICD-10-CM

## 2022-08-06 ENCOUNTER — Ambulatory Visit
Admission: RE | Admit: 2022-08-06 | Discharge: 2022-08-06 | Disposition: A | Discharge status: Home / Self Care | Payer: No Typology Code available for payment source | Source: Ambulatory Visit | Attending: Family Medicine | Admitting: Family Medicine

## 2022-08-06 DIAGNOSIS — Z1231 Encounter for screening mammogram for malignant neoplasm of breast: Secondary | ICD-10-CM

## 2022-08-13 ENCOUNTER — Other Ambulatory Visit (HOSPITAL_COMMUNITY): Payer: Self-pay

## 2022-11-12 ENCOUNTER — Other Ambulatory Visit (HOSPITAL_COMMUNITY): Payer: Self-pay

## 2022-11-14 ENCOUNTER — Telehealth: Payer: No Typology Code available for payment source | Admitting: Emergency Medicine

## 2022-11-14 ENCOUNTER — Encounter: Payer: Self-pay | Admitting: Family Medicine

## 2022-11-14 DIAGNOSIS — J019 Acute sinusitis, unspecified: Secondary | ICD-10-CM | POA: Diagnosis not present

## 2022-11-14 DIAGNOSIS — B9789 Other viral agents as the cause of diseases classified elsewhere: Secondary | ICD-10-CM | POA: Diagnosis not present

## 2022-11-14 NOTE — Progress Notes (Signed)
E-Visit for Sinus Problems  We are sorry that you are not feeling well.  Here is how we plan to help!  Based on what you have shared with me it looks like you have sinusitis.  Sinusitis is inflammation and infection in the sinus cavities of the head.  Based on your presentation I believe you most likely have Acute Viral Sinusitis.This is an infection most likely caused by a virus. There is not specific treatment for viral sinusitis other than to help you with the symptoms until the infection runs its course.    Antibiotics are not recommended by the Infectious [employer] Society of [place] unless you have severe symptoms (including high fever) or you have symptoms for more than 10 days. If you still have symptoms after 10 days, antibiotics should be considered.    You may use an oral decongestant such as Mucinex D or if you have glaucoma or high blood pressure use plain Mucinex.   Saline nasal spray help and can safely be used as often as needed for congestion. Try using saline irrigation, such as with a neti pot, several times a day while you are sick. Many neti pots come with salt packets premeasured to use to make saline. If you use your own salt, make sure it is kosher salt or sea salt (don't use table salt as it has iodine in it and you don't need that in your nose). Use distilled water to make saline. If you mix your own saline using your own salt, the recipe is 1/4 teaspoon salt in 1 cup warm water. Using saline irrigation can help prevent and treat sinus infections.   Some authorities believe that zinc sprays or the use of Echinacea may shorten the course of your symptoms.  Sinus infections are not as easily transmitted as other respiratory infection, however we still recommend that you avoid close contact with loved ones, especially the very young and elderly.  Remember to wash your hands thoroughly throughout the day as this is the number one way to prevent the spread of infection!  Home  Care: Only take medications as instructed by your medical team. Do not take these medications with alcohol. A steam or ultrasonic humidifier can help congestion.  You can place a towel over your head and breathe in the steam from hot water coming from a faucet. Avoid close contacts especially the very young and the elderly. Cover your mouth when you cough or sneeze. Always remember to wash your hands.  Get Help Right Away If: You develop worsening fever or sinus pain. You develop a severe head ache or visual changes. Your symptoms persist after you have completed your treatment plan.  Make sure you Understand these instructions. Will watch your condition. Will get help right away if you are not doing well or get worse.   Thank you for choosing an e-visit.  Your e-visit answers were reviewed by a board certified advanced clinical practitioner to complete your personal care plan. Depending upon the condition, your plan could have included both over the counter or prescription medications.  Please review your pharmacy choice. Make sure the pharmacy is open so you can pick up prescription now. If there is a problem, you may contact your provider through [employer] and have the prescription routed to another pharmacy.  Your safety is important to [place]. If you have drug allergies check your prescription carefully.   For the next 24 hours you can use MyChart to ask questions about today's visit,  request a non-urgent call back, or ask for a work or school excuse. You will get an email in the next two days asking about your experience. I hope that your e-visit has been valuable and will speed your recovery.  I have spent 5 minutes in review of e-visit questionnaire, review and updating patient chart, medical decision making and response to patient.   Willeen Cass, PhD, FNP-BC

## 2022-11-15 ENCOUNTER — Encounter: Payer: Self-pay | Admitting: Family Medicine

## 2022-11-15 ENCOUNTER — Ambulatory Visit (INDEPENDENT_AMBULATORY_CARE_PROVIDER_SITE_OTHER): Payer: No Typology Code available for payment source | Admitting: Family Medicine

## 2022-11-15 VITALS — BP 109/72 | HR 63 | Temp 98.4°F | Ht 66.0 in | Wt 165.6 lb

## 2022-11-15 DIAGNOSIS — J069 Acute upper respiratory infection, unspecified: Secondary | ICD-10-CM

## 2022-11-15 DIAGNOSIS — J0100 Acute maxillary sinusitis, unspecified: Secondary | ICD-10-CM

## 2022-11-15 DIAGNOSIS — J209 Acute bronchitis, unspecified: Secondary | ICD-10-CM

## 2022-11-15 LAB — POCT INFLUENZA A/B
Influenza A, POC: NEGATIVE
Influenza B, POC: NEGATIVE

## 2022-11-15 MED ORDER — ALBUTEROL SULFATE HFA 108 (90 BASE) MCG/ACT IN AERS: 1.0000 | INHALATION_SPRAY | RESPIRATORY_TRACT | 0 refills | Status: DC | PRN

## 2022-11-15 MED ORDER — HYDROCODONE BIT-HOMATROP MBR 5-1.5 MG/5ML PO SOLN: ORAL | 0 refills | Status: DC

## 2022-11-15 MED ORDER — PREDNISONE 20 MG PO TABS: ORAL_TABLET | ORAL | 0 refills | Status: DC

## 2022-11-15 MED ORDER — DOXYCYCLINE HYCLATE 100 MG PO CAPS: 100.0000 mg | ORAL_CAPSULE | Freq: Two times a day (BID) | ORAL | 0 refills | Status: AC

## 2022-11-15 NOTE — Progress Notes (Signed)
OFFICE VISIT  11/15/2022  CC:  Chief Complaint  Patient presents with   Nasal Congestion   Sore Throat   Headache   Body Aches/Chills   Patient is a 56 y.o. female who presents for sinus pressure.  HPI: Onset 5 days ago of nasal congestion, sinus pressure, postnasal drip, sore throat, headache, and cough.  Some body aches. Cough is dry for the most part, chest feels a little bit tight.  Some subjective fevers. Symptoms gradually worsening. Home COVID testing negative x 2. Over-the-counter cold medicine no help. She has an albuterol inhaler but has not used it.   Past Medical History:  Diagnosis Date   Acute urinary retention 2018   Once after hysterectomy surgery 2012, another spontaneous occurrence 11/2017--Dr. Jeffie Pollock started her on flomax 12/2017--much improved as of 12/22/2017 and 03/2018 urol f/u.   ALLERGIC RHINITIS 03/31/2010   Qualifier: Diagnosis of  By: Alveta Heimlich MD, Cornelia Copa     Asthma    Cervical spondylosis 2011   w/out myelopathy; nerve block injections done 08/2010 and 10/2010 (Dr. Letta Pate)   Colon cancer screening 11/20/2019   cologuard neg.  Rpt 3 yrs   Essential hypertension    Fibroids, submucosal    u/s 07/2010   GAD (generalized anxiety disorder)    with panic attacks   Heart murmur    Pt reports this is present intermittently, says past ECHO normal.   Hyperlipemia, mixed 2022   TLC   IFG (impaired fasting glucose)    a1c 5.9% Dec 2022   MDD (major depressive disorder)    Menorrhagia    Endo ablation 02/14/11   Migraine    plus tension HA's; some occipital HA's associated with her cervicalgia   Plantar fasciitis    Shingles    x 2.  Once L rib cage, once L ear.    Past Surgical History:  Procedure Laterality Date   ABDOMINAL HYSTERECTOMY     APPENDECTOMY  1979   CESAREAN SECTION  02/2004   ENDOMETRIAL ABLATION  02/14/11   ENDOMETRIAL BIOPSY  12/2010   Benign, with exogenous hormone effect.  No hyperplasia.   FINGER SURGERY  1982   Ring finger right  hand; no metal/screws in finger.   ROBOTIC ASSISTED LAPAROSCOPIC VAGINAL HYSTERECTOMY WITH FIBROID REMOVAL  02/17/12   Benign leiomyomata and benign cervix on path    Outpatient Medications Prior to Visit  Medication Sig Dispense Refill   Aspirin-Acetaminophen-Caffeine (EXCEDRIN PO) Take by mouth every morning.     cetirizine (ZYRTEC) 10 MG tablet Take 10 mg by mouth daily.     fluticasone (FLONASE) 50 MCG/ACT nasal spray Place 2 sprays into both nostrils daily. 16 g 0   lisinopril (ZESTRIL) 20 MG tablet Take 1 tablet (20 mg total) by mouth daily. 90 tablet 3   albuterol (VENTOLIN HFA) 108 (90 Base) MCG/ACT inhaler Inhale 1-2 puffs into the lungs every 4 (four) hours as needed for wheezing or shortness of breath. (Patient not taking: Reported on 05/22/2022) 1 each 0   No facility-administered medications prior to visit.    No Known Allergies  ROS As per HPI  PE:    11/15/2022   10:53 AM 06/19/2022    8:56 AM 05/31/2022    9:34 AM  Vitals with BMI  Height '5\' 6"'$  '5\' 6"'$    Weight 165 lbs 10 oz 165 lbs   BMI 16.10 96.04   Systolic 540 981 80  Diastolic 72 71 50  Pulse 63 64      Physical  Exam  VS: noted--normal. Gen: alert, NAD, NONTOXIC APPEARING. HEENT: eyes without injection, drainage, or swelling.  Ears: EACs clear, TMs with normal light reflex and landmarks.  Nose: Clear rhinorrhea, with some dried, crusty exudate adherent to mildly injected mucosa.  No purulent d/c.  She has bilateral paranasal sinus tenderness to palpation. No facial swelling.  Throat and mouth without focal lesion.  No pharyngial swelling, erythema, or exudate.   Neck: supple, no LAD.   LUNGS: CTA bilat, nonlabored resps.   CV: RRR, no m/r/g. EXT: no c/c/e SKIN: no rash   LABS:  Last CBC Lab Results  Component Value Date   WBC 4.9 11/16/2021   HGB 13.7 11/16/2021   HCT 41.3 11/16/2021   MCV 95.7 11/16/2021   MCH 31.1 11/30/2020   RDW 13.3 11/16/2021   PLT 231.0 62/83/1517   Last metabolic  panel Lab Results  Component Value Date   GLUCOSE 117 (H) 05/31/2022   NA 140 05/31/2022   K 4.1 05/31/2022   CL 103 05/31/2022   CO2 27 05/31/2022   BUN 19 05/31/2022   CREATININE 0.82 05/31/2022   GFRNONAA 79 06/03/2018   CALCIUM 9.3 05/31/2022   PROT 6.4 11/16/2021   ALBUMIN 4.3 11/16/2021   LABGLOB 2.1 06/03/2018   AGRATIO 2.1 06/03/2018   BILITOT 1.2 11/16/2021   ALKPHOS 57 11/16/2021   AST 19 11/16/2021   ALT 21 11/16/2021   IMPRESSION AND PLAN:  Acute sinusitis with acute bronchitis. Home COVID testing negative.  Flu testing negative here today. Doxycycline 100 mg twice a day x 7 days. Prednisone 40 mg a day x 5 days then 20 mg a day x 5 days. Albuterol, 2 puffs every 6 hours as needed. Hycodan cough syrup, 1 to 2 teaspoon twice daily as needed, 120 mL.  An After Visit Summary was printed and given to the patient.  FOLLOW UP: Return if symptoms worsen or fail to improve.  Signed:  Crissie Sickles, MD           11/15/2022

## 2022-12-19 NOTE — Patient Instructions (Signed)

## 2022-12-20 ENCOUNTER — Encounter: Payer: Self-pay | Admitting: Family Medicine

## 2022-12-20 ENCOUNTER — Other Ambulatory Visit (HOSPITAL_COMMUNITY): Payer: Self-pay

## 2022-12-20 ENCOUNTER — Ambulatory Visit (INDEPENDENT_AMBULATORY_CARE_PROVIDER_SITE_OTHER): Payer: 59 | Admitting: Family Medicine

## 2022-12-20 VITALS — BP 107/70 | HR 66 | Temp 98.4°F | Ht 66.0 in | Wt 164.6 lb

## 2022-12-20 DIAGNOSIS — Z0000 Encounter for general adult medical examination without abnormal findings: Secondary | ICD-10-CM | POA: Diagnosis not present

## 2022-12-20 DIAGNOSIS — I10 Essential (primary) hypertension: Secondary | ICD-10-CM

## 2022-12-20 DIAGNOSIS — E782 Mixed hyperlipidemia: Secondary | ICD-10-CM

## 2022-12-20 DIAGNOSIS — Z1211 Encounter for screening for malignant neoplasm of colon: Secondary | ICD-10-CM | POA: Diagnosis not present

## 2022-12-20 DIAGNOSIS — Z23 Encounter for immunization: Secondary | ICD-10-CM

## 2022-12-20 DIAGNOSIS — R7301 Impaired fasting glucose: Secondary | ICD-10-CM | POA: Diagnosis not present

## 2022-12-20 DIAGNOSIS — M6702 Short Achilles tendon (acquired), left ankle: Secondary | ICD-10-CM

## 2022-12-20 LAB — COMPREHENSIVE METABOLIC PANEL
ALT: 18 U/L (ref 0–35)
AST: 17 U/L (ref 0–37)
Albumin: 4.6 g/dL (ref 3.5–5.2)
Alkaline Phosphatase: 67 U/L (ref 39–117)
BUN: 19 mg/dL (ref 6–23)
CO2: 29 mEq/L (ref 19–32)
Calcium: 9.6 mg/dL (ref 8.4–10.5)
Chloride: 104 mEq/L (ref 96–112)
Creatinine, Ser: 0.87 mg/dL (ref 0.40–1.20)
GFR: 75.04 mL/min (ref >60.00)
Glucose, Bld: 114 mg/dL — ABNORMAL HIGH (ref 70–99)
Potassium: 4.8 mEq/L (ref 3.5–5.1)
Sodium: 142 mEq/L (ref 135–145)
Total Bilirubin: 1 mg/dL (ref 0.2–1.2)
Total Protein: 6.7 g/dL (ref 6.0–8.3)

## 2022-12-20 LAB — LIPID PANEL
Cholesterol: 194 mg/dL (ref 0–200)
HDL: 51.2 mg/dL (ref >39.00)
LDL Cholesterol: 108 mg/dL — ABNORMAL HIGH (ref 0–99)
NonHDL: 142.49
Total CHOL/HDL Ratio: 4
Triglycerides: 172 mg/dL — ABNORMAL HIGH (ref 0.0–149.0)
VLDL: 34.4 mg/dL (ref 0.0–40.0)

## 2022-12-20 LAB — CBC
HCT: 43.9 % (ref 36.0–46.0)
Hemoglobin: 14.5 g/dL (ref 12.0–15.0)
MCHC: 33 g/dL (ref 30.0–36.0)
MCV: 92.4 fl (ref 78.0–100.0)
Platelets: 265 10*3/uL (ref 150.0–400.0)
RBC: 4.75 Mil/uL (ref 3.87–5.11)
RDW: 13.1 % (ref 11.5–15.5)
WBC: 4.8 10*3/uL (ref 4.0–10.5)

## 2022-12-20 LAB — HEMOGLOBIN A1C: Hgb A1c MFr Bld: 6.4 % (ref 4.6–6.5)

## 2022-12-20 LAB — TSH: TSH: 1.23 u[IU]/mL (ref 0.35–5.50)

## 2022-12-20 MED ORDER — LISINOPRIL 20 MG PO TABS
20.0000 mg | ORAL_TABLET | Freq: Every day | ORAL | 3 refills | Status: DC
  Filled 2022-12-20 – 2023-02-19 (×2): qty 90, 90d supply, fill #0
  Filled 2023-06-26: qty 90, 90d supply, fill #1
  Filled 2023-09-30: qty 90, 90d supply, fill #2

## 2022-12-20 NOTE — Progress Notes (Signed)
Office Note 12/20/2022  CC:  Chief Complaint  Patient presents with   Annual Exam    Pt is fasting    HPI:  Patient is a 57 y.o. female who is here for annual health maintenance exam and 55-monthfollow-up hypertension.  She feels well, has gotten over her respiratory infection.  She saw Dr. Due to about 6 months ago for some pain and chronic weakness in the left ankle and foot. 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 He recommended Achilles stretching and stiff soled shoe and orthotic for sole. She still feels like it is weak getting a little better.  Past Medical History:  Diagnosis Date   Acute urinary retention 2018   Once after hysterectomy surgery 2012, another spontaneous occurrence 11/2017--Dr. WJeffie Pollockstarted her on flomax 12/2017--much improved as of 12/22/2017 and 03/2018 urol f/u.   ALLERGIC RHINITIS 03/31/2010   Qualifier: Diagnosis of  By: WAlveta HeimlichMD, ECornelia Copa    Asthma    Cervical spondylosis 2011   w/out myelopathy; nerve block injections done 08/2010 and 10/2010 (Dr. KLetta Pate   Colon cancer screening 11/20/2019   cologuard neg.  Rpt 3 yrs   Essential hypertension    Fibroids, submucosal    u/s 07/2010   GAD (generalized anxiety disorder)    with panic attacks   Heart murmur    Pt reports this is present intermittently, says past ECHO normal.   Hyperlipemia, mixed 2022   TLC   IFG (impaired fasting glucose)    a1c 5.9% Dec 2022   MDD (major depressive disorder)    Menorrhagia    Endo ablation 02/14/11   Migraine    plus tension HA's; some occipital HA's associated with her cervicalgia   Plantar fasciitis    Shingles    x 2.  Once L rib cage, once L ear.    Past Surgical History:  Procedure Laterality Date   ABDOMINAL HYSTERECTOMY     APPENDECTOMY  1979   CESAREAN SECTION  02/2004   ENDOMETRIAL ABLATION  02/14/11   ENDOMETRIAL BIOPSY  12/2010   Benign, with exogenous hormone effect.  No  hyperplasia.   FINGER SURGERY  1982   Ring finger right hand; no metal/screws in finger.   ROBOTIC ASSISTED LAPAROSCOPIC VAGINAL HYSTERECTOMY WITH FIBROID REMOVAL  02/17/12   Benign leiomyomata and benign cervix on path    Family History  Problem Relation Age of Onset   Heart disease Father        CABG in his 575s  Diabetes Father    Asthma Brother    Cancer Paternal Grandmother        Breast; detected in her 541'sat the earliest   Breast cancer Paternal Grandmother     Social History   Socioeconomic History   Marital status: Divorced    Spouse name: Not on file   Number of children: Not on file   Years of education: Not on file   Highest education level: Not on file  Occupational History   Not on file  Tobacco Use   Smoking status: Never   Smokeless tobacco: Never  Vaping Use   Vaping Use: Never used  Substance and Sexual Activity   Alcohol use: No   Drug use: No   Sexual activity: Yes  Other Topics Concern   Not on file  Social History Narrative   Married, one son.   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   No T/A/Ds.   Has one brother and 1 sister.   Social Determinants of Health   Financial Resource Strain: Not on file  Food Insecurity: Not on file  Transportation Needs: Not on file  Physical Activity: Not on file  Stress: Not on file  Social Connections: Not on file  Intimate Partner Violence: Not on file    Outpatient Medications Prior to Visit  Medication Sig Dispense Refill   Aspirin-Acetaminophen-Caffeine (EXCEDRIN PO) Take by mouth every morning.     cetirizine (ZYRTEC) 10 MG tablet Take 10 mg by mouth daily.     fluticasone (FLONASE) 50 MCG/ACT nasal spray Place 2 sprays into both nostrils daily. 16 g 0   albuterol (VENTOLIN HFA) 108 (90 Base) MCG/ACT inhaler Inhale 1-2 puffs into the lungs every 4 (four) hours as needed for wheezing or shortness of breath. (Patient not taking: Reported on 12/20/2022) 1 each 0    HYDROcodone bit-homatropine (HYCODAN) 5-1.5 MG/5ML syrup 1-2 tsp po bid prn cough 120 mL 0   lisinopril (ZESTRIL) 20 MG tablet Take 1 tablet (20 mg total) by mouth daily. 90 tablet 3   predniSONE (DELTASONE) 20 MG tablet 2 tabs po qd x 5d then 1 tab po qd x 5d 15 tablet 0   No facility-administered medications prior to visit.    No Known Allergies  Review of Systems  Constitutional:  Negative for appetite change, chills, fatigue and fever.  HENT:  Negative for congestion, dental problem, ear pain and sore throat.   Eyes:  Negative for discharge, redness and visual disturbance.  Respiratory:  Negative for cough, chest tightness, shortness of breath and wheezing.   Cardiovascular:  Negative for chest pain, palpitations and leg swelling.  Gastrointestinal:  Negative for abdominal pain, blood in stool, diarrhea, nausea and vomiting.  Genitourinary:  Negative for difficulty urinating, dysuria, flank pain, frequency, hematuria and urgency.  Musculoskeletal:  Negative for arthralgias, back pain, joint swelling, myalgias and neck stiffness.  Skin:  Negative for pallor and rash.  Neurological:  Negative for dizziness, speech difficulty, weakness and headaches.  Hematological:  Negative for adenopathy. Does not bruise/bleed easily.  Psychiatric/Behavioral:  Negative for confusion and sleep disturbance. The patient is not nervous/anxious.     PE;    12/20/2022    8:18 AM 11/15/2022   10:53 AM 06/19/2022    8:56 AM  Vitals with BMI  Height '5\' 6"'$  '5\' 6"'$  '5\' 6"'$   Weight 164 lbs 10 oz 165 lbs 10 oz 165 lbs  BMI 26.58 46.27 03.50  Systolic 093 818 299  Diastolic 70 72 71  Pulse 66 63 64    Exam chaperoned by Deveron Furlong, CMA. Gen: Alert, well appearing.  Patient is oriented to person, place, time, and situation. AFFECT: pleasant, lucid thought and speech. ENT: Ears: EACs clear, normal epithelium.  TMs with good light reflex and landmarks bilaterally.  Eyes: no injection, icteris, swelling,  or exudate.  EOMI, PERRLA. Nose: no drainage or turbinate edema/swelling.  No injection or focal lesion.  Mouth: lips without lesion/swelling.  Oral mucosa pink and moist.  Dentition intact and without obvious caries or gingival swelling.  Oropharynx without erythema, exudate, or swelling.  Neck: supple/nontender.  No LAD, mass, or TM.  Carotid pulses 2+ bilaterally, without bruits. CV: RRR, no m/r/g.   LUNGS: CTA bilat, nonlabored resps, good aeration in all lung fields. ABD: soft, NT, ND, BS normal.  No hepatospenomegaly or mass.  No bruits. EXT: no clubbing, cyanosis, or edema.  Musculoskeletal: Left calf with slight atrophy.  Plantarflexion and dorsiflexion strength are normal.  No tenderness over the calf or Achilles tendon. She does have some tightness in the left Achilles proximally.  No palpable deformity. Toes 2 through 5 with slight clawing deformity.   No joint swelling, erythema, warmth, or tenderness.  ROM of all joints intact. Skin - no sores or suspicious lesions or rashes or color changes  Pertinent labs:  Lab Results  Component Value Date   TSH 1.50 11/16/2021   Lab Results  Component Value Date   WBC 4.9 11/16/2021   HGB 13.7 11/16/2021   HCT 41.3 11/16/2021   MCV 95.7 11/16/2021   PLT 231.0 11/16/2021   Lab Results  Component Value Date   CREATININE 0.82 05/31/2022   BUN 19 05/31/2022   NA 140 05/31/2022   K 4.1 05/31/2022   CL 103 05/31/2022   CO2 27 05/31/2022   Lab Results  Component Value Date   ALT 21 11/16/2021   AST 19 11/16/2021   ALKPHOS 57 11/16/2021   BILITOT 1.2 11/16/2021   Lab Results  Component Value Date   CHOL 188 11/16/2021   Lab Results  Component Value Date   HDL 47.90 11/16/2021   Lab Results  Component Value Date   LDLCALC 104 (H) 11/30/2020   Lab Results  Component Value Date   TRIG 261.0 (H) 11/16/2021   Lab Results  Component Value Date   CHOLHDL 4 11/16/2021   Lab Results  Component Value Date   HGBA1C 5.8  (A) 06/19/2022   HGBA1C 5.8 06/19/2022   HGBA1C 5.8 06/19/2022   HGBA1C 5.8 06/19/2022   ASSESSMENT AND PLAN:   #1 health maintenance exam: Reviewed age and gender appropriate health maintenance issues (prudent diet, regular exercise, health risks of tobacco and excessive alcohol, use of seatbelts, fire alarms in home, use of sunscreen).  Also reviewed age and gender appropriate health screening as well as vaccine recommendations. Vaccines: Tdap today.  Otherwise UTD. Labs: fasting HP + Hba1c (prediabetes) ordered. Cervical ca screening: uterus and cervix surgically excised-->benign.  No further cerv ca screening indicated. Breast ca screening: mammo normal 07/2022. Colon ca screening: next cologuard due at this time->ordered.  #2 chronic left Achilles dysfunction. Only gradual mild improvement over the last year with stretching and strengthening. Question tibial nerve injury. Will give this some more time and if necessary in the future we will try to clarify with nerve conduction study.  #3 hypertension, well-controlled on lisinopril 20 mg a day. Electrolytes and creatinine today.  An After Visit Summary was printed and given to the patient.  FOLLOW UP:  Return in about 6 months (around 06/20/2023) for routine chronic illness f/u.  Signed:  Crissie Sickles, MD           12/20/2022

## 2022-12-23 ENCOUNTER — Encounter: Payer: Self-pay | Admitting: Family Medicine

## 2022-12-24 ENCOUNTER — Ambulatory Visit (INDEPENDENT_AMBULATORY_CARE_PROVIDER_SITE_OTHER): Payer: 59

## 2022-12-24 ENCOUNTER — Ambulatory Visit (INDEPENDENT_AMBULATORY_CARE_PROVIDER_SITE_OTHER): Payer: 59 | Admitting: Family Medicine

## 2022-12-24 ENCOUNTER — Ambulatory Visit: Payer: Self-pay

## 2022-12-24 VITALS — BP 132/84 | HR 66 | Ht 66.0 in | Wt 170.0 lb

## 2022-12-24 DIAGNOSIS — M545 Low back pain, unspecified: Secondary | ICD-10-CM | POA: Diagnosis not present

## 2022-12-24 DIAGNOSIS — M79672 Pain in left foot: Secondary | ICD-10-CM

## 2022-12-24 DIAGNOSIS — M79605 Pain in left leg: Secondary | ICD-10-CM | POA: Diagnosis not present

## 2022-12-24 DIAGNOSIS — R29898 Other symptoms and signs involving the musculoskeletal system: Secondary | ICD-10-CM

## 2022-12-24 NOTE — Progress Notes (Signed)
Sara Burton, LAT, ATC acting as a scribe for Sara Leader, MD.  Subjective:    CC: L foot pain  HPI: Pt is a 57 y/o female c/o L foot pain that has worsened since Oct. Pt was seen this this issue by Dr. Sharol Given on 05/16/22 and a L Achilles contracture was noted. Pt is not really having much pain, but is concerned because she can't go up on her "tip toes" on the L foot and her her toes will flex.    Swelling: no Aggravates: PF Treatments tried: brace, HEP  Dx imaging: 05/16/22 L foot XR  Pertinent review of Systems: No fevers or chills.  Relevant historical information: Anxiety disorder.   Objective:    Vitals:   12/24/22 1423  BP: 132/84  Pulse: 66  SpO2: 100%   General: Well Developed, well nourished, and in no acute distress.   MSK: L-spine: Normal appearing Nontender to palpation spinal midline. Normal lumbar motion. Lower extremity strength is diminished left foot plantarflexion 4/5.  Patient is unable to bear her body weight left plantarflexion. Otherwise looks to be strength reflexes and sensation are intact distal bilateral extremities.   Left foot and ankle: Nontender to palpation.  Strength decreased plantarflexion as noted above. No palpable defects along the posterior aspect of the ankle and foot.  Achilles tendon is nontender.   Lab and Radiology Results   Diagnostic Limited MSK Ultrasound of: Left Achilles tendon Achilles tendon normal-appearing with no defects.  Soleus is normal-appearing Impression: Normal-appearing Achilles tendon   X-ray images lumbar spine obtained today personally and independently interpreted. 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 DDD L5-L6 (normally S1) Await formal radiology review  Impression and Recommendations:    Assessment and Plan: 57 y.o. female with left foot plantarflexion weakness.  She noticed this with a change in gait.  The weakness is pretty subtle difficult to pick up on a  clinic but she is unable to lift her body weight with her left foot plantarflexion.  She is doing a good job over the last 4 to 6 months on her own with home exercise program directed by Dr. Sharol Given previously.  She is done good strengthening exercises stretching tried a night splint which should have resolved Achilles tendon contracture tightness tendinitis etc.  The weakness plus the abnormal appearance of lumbar spine x-ray per my read is concerning for S1 nerve compression on the left side.  Plan for lumbar spine MRI to evaluate for potential S1 nerve weakness causing the foot plantarflexion weakness.  If this ultimately is not the cause on MRI nerve conduction study would be the next step.  PDMP not reviewed this encounter. Orders Placed This Encounter  Procedures   [place] LIMITED JOINT SPACE STRUCTURES LOW LEFT(NO LINKED CHARGES)    Order Specific Question:   Reason for Exam (SYMPTOM  OR DIAGNOSIS REQUIRED)    Answer:   left foot pain    Order Specific Question:   Preferred imaging location?    Answer:   [facility]   DG Lumbar Spine 2-3 Views    Standing Status:   Future    Number of Occurrences:   1    Standing Expiration Date:   12/25/2023    Order Specific Question:   Reason for Exam (SYMPTOM  OR DIAGNOSIS REQUIRED)    Answer:   eval leg weakness    Order Specific Question:   Is patient pregnant?    Answer:   No  Order Specific Question:   Preferred imaging location?    Answer:   [facility]   MR Lumbar Spine Wo Contrast    Standing Status:   Future    Standing Expiration Date:   12/25/2023    Order Specific Question:   What is the patient's sedation requirement?    Answer:   No Sedation    Order Specific Question:   Does the patient have a pacemaker or implanted devices?    Answer:   No    Order Specific Question:   Preferred imaging location?    Answer:   [facility] development [facility] (table limit-350lbs)   No orders of the defined types were placed in  this encounter.   Discussed warning signs or symptoms. Please see discharge instructions. Patient expresses understanding.   The above documentation has been reviewed and is accurate and complete Sara Burton, M.D.

## 2022-12-24 NOTE — Patient Instructions (Signed)
Thank you for coming in today.   Please get an Xray today before you leave   You should hear from MRI scheduling within 1 week. If you do not hear please let me know.    Keep working on the exercises.

## 2022-12-26 NOTE — Progress Notes (Signed)
Lumbar spine x-ray shows a little bit of arthritis.

## 2022-12-27 ENCOUNTER — Other Ambulatory Visit (HOSPITAL_COMMUNITY): Payer: Self-pay

## 2022-12-30 ENCOUNTER — Ambulatory Visit (INDEPENDENT_AMBULATORY_CARE_PROVIDER_SITE_OTHER): Payer: 59

## 2022-12-30 DIAGNOSIS — R202 Paresthesia of skin: Secondary | ICD-10-CM

## 2022-12-30 DIAGNOSIS — M5136 Other intervertebral disc degeneration, lumbar region: Secondary | ICD-10-CM | POA: Diagnosis not present

## 2022-12-30 DIAGNOSIS — M79605 Pain in left leg: Secondary | ICD-10-CM

## 2022-12-30 DIAGNOSIS — R29898 Other symptoms and signs involving the musculoskeletal system: Secondary | ICD-10-CM | POA: Diagnosis not present

## 2022-12-30 DIAGNOSIS — M545 Low back pain, unspecified: Secondary | ICD-10-CM | POA: Diagnosis not present

## 2022-12-31 ENCOUNTER — Telehealth: Payer: Self-pay | Admitting: Family Medicine

## 2022-12-31 DIAGNOSIS — R29898 Other symptoms and signs involving the musculoskeletal system: Secondary | ICD-10-CM

## 2022-12-31 DIAGNOSIS — M79672 Pain in left foot: Secondary | ICD-10-CM

## 2022-12-31 DIAGNOSIS — M898X8 Other specified disorders of bone, other site: Secondary | ICD-10-CM

## 2022-12-31 NOTE — Progress Notes (Signed)
As we spoke on the phone your MRI does show a pinched nerve which probably explains your pain but also shows this abnormal mass in your spine that needs to be looked at with a fancier MRI.

## 2022-12-31 NOTE — Telephone Encounter (Signed)
I called Sara Burton about the MRI.  Plan for MRI with IV contrast after discussing with radiology.

## 2023-01-06 DIAGNOSIS — Z1211 Encounter for screening for malignant neoplasm of colon: Secondary | ICD-10-CM | POA: Diagnosis not present

## 2023-01-13 ENCOUNTER — Other Ambulatory Visit: Payer: 59

## 2023-01-16 ENCOUNTER — Encounter: Payer: Self-pay | Admitting: Family Medicine

## 2023-01-16 LAB — COLOGUARD: COLOGUARD: NEGATIVE

## 2023-01-20 ENCOUNTER — Ambulatory Visit (INDEPENDENT_AMBULATORY_CARE_PROVIDER_SITE_OTHER): Payer: 59

## 2023-01-20 DIAGNOSIS — M47816 Spondylosis without myelopathy or radiculopathy, lumbar region: Secondary | ICD-10-CM | POA: Diagnosis not present

## 2023-01-20 DIAGNOSIS — R29898 Other symptoms and signs involving the musculoskeletal system: Secondary | ICD-10-CM | POA: Diagnosis not present

## 2023-01-20 DIAGNOSIS — M79672 Pain in left foot: Secondary | ICD-10-CM

## 2023-01-20 DIAGNOSIS — M898X8 Other specified disorders of bone, other site: Secondary | ICD-10-CM

## 2023-01-20 MED ORDER — GADOBUTROL 1 MMOL/ML IV SOLN
7.5000 mL | Freq: Once | INTRAVENOUS | Status: AC | PRN
  Administered 2023-01-20: 7.5 mL via INTRAVENOUS

## 2023-01-21 ENCOUNTER — Telehealth: Payer: Self-pay | Admitting: Family Medicine

## 2023-01-21 DIAGNOSIS — M898X8 Other specified disorders of bone, other site: Secondary | ICD-10-CM

## 2023-01-21 DIAGNOSIS — R29898 Other symptoms and signs involving the musculoskeletal system: Secondary | ICD-10-CM

## 2023-01-21 DIAGNOSIS — M79672 Pain in left foot: Secondary | ICD-10-CM

## 2023-01-21 NOTE — Progress Notes (Signed)
MRI lumbar spine with contrast is concerning for schwannoma.  We talked about this over the phone.  I referred you to neurosurgery to further evaluate.

## 2023-01-21 NOTE — Telephone Encounter (Signed)
Referred to neurosurgery to evaluate schwannoma seen on MRI.

## 2023-01-29 ENCOUNTER — Encounter: Payer: Self-pay | Admitting: Family Medicine

## 2023-01-31 DIAGNOSIS — D497 Neoplasm of unspecified behavior of endocrine glands and other parts of nervous system: Secondary | ICD-10-CM | POA: Diagnosis not present

## 2023-02-03 ENCOUNTER — Encounter: Payer: Self-pay | Admitting: Family Medicine

## 2023-02-04 ENCOUNTER — Other Ambulatory Visit: Payer: Self-pay | Admitting: Neurosurgery

## 2023-02-12 ENCOUNTER — Other Ambulatory Visit: Payer: Self-pay | Admitting: Neurosurgery

## 2023-02-20 ENCOUNTER — Other Ambulatory Visit (HOSPITAL_COMMUNITY): Payer: Self-pay

## 2023-02-20 NOTE — Pre-Procedure Instructions (Signed)
Surgical Instructions    Your procedure is scheduled on Wednesday, March 20.  Report to [facility] Main Entrance "A" at 10:10 A.M., then check in with the Admitting office.  Call this number if you have problems the morning of surgery:  [phone]   If you have any questions prior to your surgery date call [phone]: Open Monday-Friday 8am-4pm If you experience any cold or flu symptoms such as cough, fever, chills, shortness of breath, etc. between now and your scheduled surgery, please notify us at the above number     Remember:  Do not eat or drink after midnight the night before your surgery     Take these medicines the morning of surgery with A SIP OF WATER:  cetirizine (ZYRTEC)   As of today, STOP taking any Aspirin (unless otherwise instructed by your surgeon) Aleve, Naproxen, Ibuprofen, Motrin, Advil, Goody's, BC's, all herbal medications, fish oil, and all vitamins.   [facility] is not responsible for any belongings or valuables.    Do NOT Smoke (Tobacco/Vaping)  24 hours prior to your procedure  If you use a CPAP at night, you may bring your mask for your overnight stay.   Contacts, glasses, hearing aids, dentures or partials may not be worn into surgery, please bring cases for these belongings   For patients admitted to the hospital, discharge time will be determined by your treatment team.   Patients discharged the day of surgery will not be allowed to drive home, and someone needs to stay with them for 24 hours.   SURGICAL WAITING ROOM VISITATION Patients having surgery or a procedure may have no more than 2 support people in the waiting area - these visitors may rotate.   Children under the age of 74 must have an adult with them who is not the patient. If the patient needs to stay at the hospital during part of their recovery, the visitor guidelines for inpatient rooms apply. Pre-op nurse will coordinate an appropriate time for 1 support person to accompany  patient in pre-op.  This support person may not rotate.   Please refer to [email or web address] for the visitor guidelines for Inpatients (after your surgery is over and you are in a regular room).    Special instructions:    Oral Hygiene is also important to reduce your risk of infection.  Remember - BRUSH YOUR TEETH THE MORNING OF SURGERY WITH YOUR REGULAR TOOTHPASTE   [facility]- Preparing For Surgery  Before surgery, you can play an important role. Because skin is not sterile, your skin needs to be as free of germs as possible. You can reduce the number of germs on your skin by washing with CHG (chlorahexidine gluconate) Soap before surgery.  CHG is an antiseptic cleaner which kills germs and bonds with the skin to continue killing germs even after washing.     Please do not use if you have an allergy to CHG or antibacterial soaps. If your skin becomes reddened/irritated stop using the CHG.  Do not shave (including legs and underarms) for at least 48 hours prior to first CHG shower. It is OK to shave your face.  Please follow these instructions carefully.     Shower the NIGHT BEFORE SURGERY and the MORNING OF SURGERY with CHG Soap.   If you chose to wash your hair, wash your hair first as usual with your normal shampoo. After you shampoo, rinse your hair and body thoroughly to remove the shampoo.  Then [employer] and genitals (  private parts) with your normal soap and rinse thoroughly to remove soap.  After that Use CHG Soap as you would any other liquid soap. You can apply CHG directly to the skin and wash gently with a scrungie or a clean washcloth.   Apply the CHG Soap to your body ONLY FROM THE NECK DOWN.  Do not use on open wounds or open sores. Avoid contact with your eyes, ears, mouth and genitals (private parts). Wash Face and genitals (private parts)  with your normal soap.   Wash thoroughly, paying special attention to the area  where your surgery will be performed.  Thoroughly rinse your body with warm water from the neck down.  DO NOT shower/wash with your normal soap after using and rinsing off the CHG Soap.  Pat yourself dry with a CLEAN TOWEL.  Wear CLEAN PAJAMAS to bed the night before surgery  Place CLEAN SHEETS on your bed the night before your surgery  DO NOT SLEEP WITH PETS.   Day of Surgery:  Take a shower with CHG soap. Wear Clean/Comfortable clothing the morning of surgery Do not wear jewelry or makeup. Do not wear lotions, powders, perfumes/cologne or deodorant. Do not shave 48 hours prior to surgery.  Men may shave face and neck. Do not bring valuables to the hospital. Do not wear nail polish, gel polish, artificial nails, or any other type of covering on natural nails (fingers and toes) If you have artificial nails or gel coating that need to be removed by a nail salon, please have this removed prior to surgery. Artificial nails or gel coating may interfere with anesthesia's ability to adequately monitor your vital signs. Remember to brush your teeth WITH YOUR REGULAR TOOTHPASTE.    If you received a COVID test during your pre-op visit, it is requested that you wear a mask when out in public, stay away from anyone that may not be feeling well, and notify your surgeon if you develop symptoms. If you have been in contact with anyone that has tested positive in the last 10 days, please notify your surgeon.    Please read over the following fact sheets that you were given.

## 2023-02-21 ENCOUNTER — Other Ambulatory Visit: Payer: Self-pay

## 2023-02-21 ENCOUNTER — Encounter (HOSPITAL_COMMUNITY): Payer: Self-pay

## 2023-02-21 ENCOUNTER — Encounter (HOSPITAL_COMMUNITY)
Admission: RE | Admit: 2023-02-21 | Discharge: 2023-02-21 | Disposition: A | Discharge status: Home / Self Care | Payer: 59 | Source: Ambulatory Visit | Attending: Neurosurgery | Admitting: Neurosurgery

## 2023-02-21 VITALS — BP 129/68 | HR 78 | Temp 98.1°F | Resp 18 | Ht 66.0 in | Wt 164.3 lb

## 2023-02-21 DIAGNOSIS — Z01818 Encounter for other preprocedural examination: Secondary | ICD-10-CM | POA: Insufficient documentation

## 2023-02-21 DIAGNOSIS — I10 Essential (primary) hypertension: Secondary | ICD-10-CM | POA: Insufficient documentation

## 2023-02-21 LAB — CBC
HCT: 42.2 % (ref 36.0–46.0)
Hemoglobin: 14.2 g/dL (ref 12.0–15.0)
MCH: 31.2 pg (ref 26.0–34.0)
MCHC: 33.6 g/dL (ref 30.0–36.0)
MCV: 92.7 fL (ref 80.0–100.0)
Platelets: 232 10*3/uL (ref 150–400)
RBC: 4.55 MIL/uL (ref 3.87–5.11)
RDW: 12.1 % (ref 11.5–15.5)
WBC: 5.6 10*3/uL (ref 4.0–10.5)
nRBC: 0 % (ref 0.0–0.2)

## 2023-02-21 LAB — BASIC METABOLIC PANEL
Anion gap: 7 (ref 5–15)
BUN: 15 mg/dL (ref 6–20)
CO2: 24 mmol/L (ref 22–32)
Calcium: 9.1 mg/dL (ref 8.9–10.3)
Chloride: 106 mmol/L (ref 98–111)
Creatinine, Ser: 0.74 mg/dL (ref 0.44–1.00)
GFR, Estimated: >60 mL/min (ref >60)
Glucose, Bld: 105 mg/dL — ABNORMAL HIGH (ref 70–99)
Potassium: 3.5 mmol/L (ref 3.5–5.1)
Sodium: 137 mmol/L (ref 135–145)

## 2023-02-21 LAB — TYPE AND SCREEN
ABO/RH(D): A POS
Antibody Screen: NEGATIVE

## 2023-02-21 LAB — SURGICAL PCR SCREEN
MRSA, PCR: NEGATIVE
Staphylococcus aureus: NEGATIVE

## 2023-02-21 NOTE — Progress Notes (Signed)
PCP - Dr. Shawnie Dapper Cardiologist - denies  PPM/ICD - denies   Chest x-ray - denies EKG - 02/21/23 Stress Test - denies ECHO - pt states she had one 20+ years ago in [place]. She said the hospital no longer exists, where it was done. She says that results showed no abnormalities (normal).  Cardiac Cath - denies  Sleep Study - denies   DM- denies  ASA/Blood Thinner Instructions: n/a   ERAS Protcol - no, NPO   COVID TEST- n/a   Anesthesia review: no  Patient denies shortness of breath, fever, cough and chest pain at PAT appointment   All instructions explained to the patient, with a verbal understanding of the material. Patient agrees to go over the instructions while at home for a better understanding.  The opportunity to ask questions was provided.

## 2023-02-26 ENCOUNTER — Inpatient Hospital Stay (HOSPITAL_COMMUNITY)
Admission: RE | Disposition: A | Discharge status: Home / Self Care | Payer: Self-pay | Source: Home / Self Care | Attending: Neurosurgery

## 2023-02-26 ENCOUNTER — Other Ambulatory Visit: Payer: Self-pay

## 2023-02-26 ENCOUNTER — Inpatient Hospital Stay (HOSPITAL_COMMUNITY)
Admission: RE | Admit: 2023-02-26 | Discharge: 2023-02-27 | DRG: 983 | Disposition: A | Discharge status: Home / Self Care | Payer: 59 | Attending: Neurosurgery | Admitting: Neurosurgery

## 2023-02-26 ENCOUNTER — Inpatient Hospital Stay (HOSPITAL_COMMUNITY): Payer: 59 | Admitting: Anesthesiology

## 2023-02-26 ENCOUNTER — Encounter (HOSPITAL_COMMUNITY): Payer: Self-pay | Admitting: Neurosurgery

## 2023-02-26 ENCOUNTER — Inpatient Hospital Stay (HOSPITAL_COMMUNITY): Payer: 59

## 2023-02-26 ENCOUNTER — Inpatient Hospital Stay (HOSPITAL_COMMUNITY): Payer: 59 | Admitting: Physician Assistant

## 2023-02-26 DIAGNOSIS — F411 Generalized anxiety disorder: Secondary | ICD-10-CM | POA: Diagnosis not present

## 2023-02-26 DIAGNOSIS — Z79899 Other long term (current) drug therapy: Secondary | ICD-10-CM | POA: Diagnosis not present

## 2023-02-26 DIAGNOSIS — D497 Neoplasm of unspecified behavior of endocrine glands and other parts of nervous system: Secondary | ICD-10-CM | POA: Diagnosis not present

## 2023-02-26 DIAGNOSIS — D359 Benign neoplasm of endocrine gland, unspecified: Secondary | ICD-10-CM

## 2023-02-26 DIAGNOSIS — R7303 Prediabetes: Secondary | ICD-10-CM | POA: Diagnosis present

## 2023-02-26 DIAGNOSIS — Z803 Family history of malignant neoplasm of breast: Secondary | ICD-10-CM

## 2023-02-26 DIAGNOSIS — Z833 Family history of diabetes mellitus: Secondary | ICD-10-CM

## 2023-02-26 DIAGNOSIS — Z8249 Family history of ischemic heart disease and other diseases of the circulatory system: Secondary | ICD-10-CM | POA: Diagnosis not present

## 2023-02-26 DIAGNOSIS — Z825 Family history of asthma and other chronic lower respiratory diseases: Secondary | ICD-10-CM | POA: Diagnosis not present

## 2023-02-26 DIAGNOSIS — G44209 Tension-type headache, unspecified, not intractable: Secondary | ICD-10-CM | POA: Diagnosis present

## 2023-02-26 DIAGNOSIS — F329 Major depressive disorder, single episode, unspecified: Secondary | ICD-10-CM | POA: Diagnosis present

## 2023-02-26 DIAGNOSIS — Z7982 Long term (current) use of aspirin: Secondary | ICD-10-CM

## 2023-02-26 DIAGNOSIS — J45909 Unspecified asthma, uncomplicated: Secondary | ICD-10-CM | POA: Diagnosis present

## 2023-02-26 DIAGNOSIS — I10 Essential (primary) hypertension: Secondary | ICD-10-CM | POA: Diagnosis not present

## 2023-02-26 DIAGNOSIS — M961 Postlaminectomy syndrome, not elsewhere classified: Secondary | ICD-10-CM | POA: Diagnosis not present

## 2023-02-26 DIAGNOSIS — E782 Mixed hyperlipidemia: Secondary | ICD-10-CM | POA: Diagnosis present

## 2023-02-26 HISTORY — PX: LAMINECTOMY: SHX219

## 2023-02-26 LAB — ABO/RH: ABO/RH(D): A POS

## 2023-02-26 SURGERY — THORACIC LAMINECTOMY FOR TUMOR
Anesthesia: General

## 2023-02-26 MED ORDER — ALBUTEROL SULFATE (2.5 MG/3ML) 0.083% IN NEBU: 2.5000 mg | INHALATION_SOLUTION | RESPIRATORY_TRACT | Status: DC | PRN

## 2023-02-26 MED ORDER — LORATADINE 10 MG PO TABS
10.0000 mg | ORAL_TABLET | Freq: Every day | ORAL | Status: DC
  Administered 2023-02-26 – 2023-02-27 (×2): 10 mg via ORAL
  Filled 2023-02-26 (×2): qty 1

## 2023-02-26 MED ORDER — ONDANSETRON HCL 4 MG/2ML IJ SOLN
INTRAMUSCULAR | Status: DC | PRN
  Administered 2023-02-26: 4 mg via INTRAVENOUS

## 2023-02-26 MED ORDER — PROPOFOL 10 MG/ML IV BOLUS
INTRAVENOUS | Status: AC
  Filled 2023-02-26: qty 20

## 2023-02-26 MED ORDER — OXYCODONE HCL 5 MG PO TABS: 5.0000 mg | ORAL_TABLET | ORAL | Status: DC | PRN

## 2023-02-26 MED ORDER — 0.9 % SODIUM CHLORIDE (POUR BTL) OPTIME
TOPICAL | Status: DC | PRN
  Administered 2023-02-26: 1000 mL

## 2023-02-26 MED ORDER — OXYCODONE HCL 5 MG/5ML PO SOLN: 5.0000 mg | Freq: Once | ORAL | Status: DC | PRN

## 2023-02-26 MED ORDER — LIDOCAINE HCL (CARDIAC) PF 100 MG/5ML IV SOSY
PREFILLED_SYRINGE | INTRAVENOUS | Status: DC | PRN
  Administered 2023-02-26: 60 mg via INTRAVENOUS

## 2023-02-26 MED ORDER — LISINOPRIL 20 MG PO TABS
20.0000 mg | ORAL_TABLET | Freq: Every day | ORAL | Status: DC
  Administered 2023-02-27: 20 mg via ORAL
  Filled 2023-02-26: qty 1

## 2023-02-26 MED ORDER — DOCUSATE SODIUM 100 MG PO CAPS
100.0000 mg | ORAL_CAPSULE | Freq: Two times a day (BID) | ORAL | Status: DC
  Administered 2023-02-26 – 2023-02-27 (×2): 100 mg via ORAL
  Filled 2023-02-26 (×2): qty 1

## 2023-02-26 MED ORDER — MENTHOL 3 MG MT LOZG: 1.0000 | LOZENGE | OROMUCOSAL | Status: DC | PRN

## 2023-02-26 MED ORDER — LACTATED RINGERS IV SOLN: INTRAVENOUS | Status: DC

## 2023-02-26 MED ORDER — FENTANYL CITRATE (PF) 250 MCG/5ML IJ SOLN
INTRAMUSCULAR | Status: DC | PRN
  Administered 2023-02-26: 100 ug via INTRAVENOUS
  Administered 2023-02-26: 50 ug via INTRAVENOUS

## 2023-02-26 MED ORDER — BUPIVACAINE-EPINEPHRINE (PF) 0.25% -1:200000 IJ SOLN
INTRAMUSCULAR | Status: AC
  Filled 2023-02-26: qty 30

## 2023-02-26 MED ORDER — OXYCODONE HCL 5 MG PO TABS: 5.0000 mg | ORAL_TABLET | Freq: Once | ORAL | Status: DC | PRN

## 2023-02-26 MED ORDER — CEFAZOLIN SODIUM-DEXTROSE 2-4 GM/100ML-% IV SOLN
INTRAVENOUS | Status: AC
  Filled 2023-02-26: qty 100

## 2023-02-26 MED ORDER — HYDROMORPHONE HCL 1 MG/ML IJ SOLN
INTRAMUSCULAR | Status: DC | PRN
  Administered 2023-02-26: .5 mg via INTRAVENOUS

## 2023-02-26 MED ORDER — OXYCODONE HCL 5 MG PO TABS
10.0000 mg | ORAL_TABLET | ORAL | Status: DC | PRN
  Administered 2023-02-26 – 2023-02-27 (×4): 10 mg via ORAL
  Filled 2023-02-26 (×4): qty 2

## 2023-02-26 MED ORDER — ROCURONIUM BROMIDE 10 MG/ML (PF) SYRINGE
PREFILLED_SYRINGE | INTRAVENOUS | Status: AC
  Filled 2023-02-26: qty 10

## 2023-02-26 MED ORDER — THROMBIN 20000 UNITS EX SOLR
CUTANEOUS | Status: DC | PRN
  Administered 2023-02-26: 20 mL via TOPICAL

## 2023-02-26 MED ORDER — PROMETHAZINE HCL 25 MG/ML IJ SOLN: 6.2500 mg | INTRAMUSCULAR | Status: DC | PRN

## 2023-02-26 MED ORDER — THROMBIN 20000 UNITS EX SOLR
CUTANEOUS | Status: AC
  Filled 2023-02-26: qty 20000

## 2023-02-26 MED ORDER — THROMBIN 5000 UNITS EX SOLR
CUTANEOUS | Status: AC
  Filled 2023-02-26: qty 5000

## 2023-02-26 MED ORDER — BISACODYL 10 MG RE SUPP: 10.0000 mg | Freq: Every day | RECTAL | Status: DC | PRN

## 2023-02-26 MED ORDER — PHENOL 1.4 % MT LIQD: 1.0000 | OROMUCOSAL | Status: DC | PRN

## 2023-02-26 MED ORDER — CELECOXIB 200 MG PO CAPS
ORAL_CAPSULE | ORAL | Status: AC
  Administered 2023-02-26: 200 mg via ORAL
  Filled 2023-02-26: qty 1

## 2023-02-26 MED ORDER — ACETAMINOPHEN 500 MG PO TABS
1000.0000 mg | ORAL_TABLET | Freq: Four times a day (QID) | ORAL | Status: DC
  Administered 2023-02-26 – 2023-02-27 (×3): 1000 mg via ORAL
  Filled 2023-02-26 (×3): qty 2

## 2023-02-26 MED ORDER — SUCCINYLCHOLINE CHLORIDE 200 MG/10ML IV SOSY
PREFILLED_SYRINGE | INTRAVENOUS | Status: AC
  Filled 2023-02-26: qty 10

## 2023-02-26 MED ORDER — FENTANYL CITRATE (PF) 100 MCG/2ML IJ SOLN
25.0000 ug | INTRAMUSCULAR | Status: DC | PRN
  Administered 2023-02-26 (×3): 50 ug via INTRAVENOUS

## 2023-02-26 MED ORDER — PROPOFOL 1000 MG/100ML IV EMUL
INTRAVENOUS | Status: AC
  Filled 2023-02-26: qty 100

## 2023-02-26 MED ORDER — ACETAMINOPHEN 325 MG PO TABS: 650.0000 mg | ORAL_TABLET | ORAL | Status: DC | PRN

## 2023-02-26 MED ORDER — SODIUM CHLORIDE 0.9% FLUSH
3.0000 mL | Freq: Two times a day (BID) | INTRAVENOUS | Status: DC
  Administered 2023-02-26 – 2023-02-27 (×2): 3 mL via INTRAVENOUS

## 2023-02-26 MED ORDER — FENTANYL CITRATE (PF) 250 MCG/5ML IJ SOLN
INTRAMUSCULAR | Status: AC
  Filled 2023-02-26: qty 5

## 2023-02-26 MED ORDER — FENTANYL CITRATE (PF) 100 MCG/2ML IJ SOLN
INTRAMUSCULAR | Status: AC
  Filled 2023-02-26: qty 2

## 2023-02-26 MED ORDER — PHENYLEPHRINE HCL-NACL 20-0.9 MG/250ML-% IV SOLN
INTRAVENOUS | Status: DC | PRN
  Administered 2023-02-26: 40 ug/min via INTRAVENOUS

## 2023-02-26 MED ORDER — PROPOFOL 10 MG/ML IV BOLUS
INTRAVENOUS | Status: DC | PRN
  Administered 2023-02-26: 200 mg via INTRAVENOUS

## 2023-02-26 MED ORDER — ONDANSETRON HCL 4 MG/2ML IJ SOLN
INTRAMUSCULAR | Status: AC
  Filled 2023-02-26: qty 4

## 2023-02-26 MED ORDER — MIDAZOLAM HCL 2 MG/2ML IJ SOLN
INTRAMUSCULAR | Status: AC
  Filled 2023-02-26: qty 2

## 2023-02-26 MED ORDER — CHLORHEXIDINE GLUCONATE CLOTH 2 % EX PADS: 6.0000 | MEDICATED_PAD | Freq: Once | CUTANEOUS | Status: DC

## 2023-02-26 MED ORDER — MORPHINE SULFATE (PF) 4 MG/ML IV SOLN
4.0000 mg | INTRAVENOUS | Status: DC | PRN
  Administered 2023-02-26: 4 mg via INTRAVENOUS
  Filled 2023-02-26: qty 1

## 2023-02-26 MED ORDER — PROPOFOL 500 MG/50ML IV EMUL
INTRAVENOUS | Status: DC | PRN
  Administered 2023-02-26: 80 ug/kg/min via INTRAVENOUS

## 2023-02-26 MED ORDER — BUPIVACAINE-EPINEPHRINE 0.25% -1:200000 IJ SOLN
INTRAMUSCULAR | Status: DC | PRN
  Administered 2023-02-26: 10 mL

## 2023-02-26 MED ORDER — ORAL CARE MOUTH RINSE: 15.0000 mL | Freq: Once | OROMUCOSAL | Status: AC

## 2023-02-26 MED ORDER — ACETAMINOPHEN 650 MG RE SUPP: 650.0000 mg | RECTAL | Status: DC | PRN

## 2023-02-26 MED ORDER — CYCLOBENZAPRINE HCL 10 MG PO TABS
10.0000 mg | ORAL_TABLET | Freq: Three times a day (TID) | ORAL | Status: DC | PRN
  Administered 2023-02-26 – 2023-02-27 (×2): 10 mg via ORAL
  Filled 2023-02-26 (×2): qty 1

## 2023-02-26 MED ORDER — ZOLPIDEM TARTRATE 5 MG PO TABS: 5.0000 mg | ORAL_TABLET | Freq: Every evening | ORAL | Status: DC | PRN

## 2023-02-26 MED ORDER — ONDANSETRON HCL 4 MG PO TABS
4.0000 mg | ORAL_TABLET | Freq: Four times a day (QID) | ORAL | Status: DC | PRN
  Administered 2023-02-27: 4 mg via ORAL
  Filled 2023-02-26: qty 1

## 2023-02-26 MED ORDER — BACITRACIN ZINC 500 UNIT/GM EX OINT
TOPICAL_OINTMENT | CUTANEOUS | Status: AC
  Filled 2023-02-26: qty 28.35

## 2023-02-26 MED ORDER — DEXAMETHASONE SODIUM PHOSPHATE 10 MG/ML IJ SOLN
INTRAMUSCULAR | Status: DC | PRN
  Administered 2023-02-26: 10 mg via INTRAVENOUS

## 2023-02-26 MED ORDER — LIDOCAINE 2% (20 MG/ML) 5 ML SYRINGE
INTRAMUSCULAR | Status: AC
  Filled 2023-02-26: qty 10

## 2023-02-26 MED ORDER — CHLORHEXIDINE GLUCONATE 0.12 % MT SOLN
OROMUCOSAL | Status: AC
  Administered 2023-02-26: 15 mL via OROMUCOSAL
  Filled 2023-02-26: qty 15

## 2023-02-26 MED ORDER — CEFAZOLIN SODIUM-DEXTROSE 2-4 GM/100ML-% IV SOLN
2.0000 g | Freq: Three times a day (TID) | INTRAVENOUS | Status: AC
  Administered 2023-02-26 – 2023-02-27 (×2): 2 g via INTRAVENOUS
  Filled 2023-02-26 (×2): qty 100

## 2023-02-26 MED ORDER — MIDAZOLAM HCL 2 MG/2ML IJ SOLN
INTRAMUSCULAR | Status: DC | PRN
  Administered 2023-02-26: 2 mg via INTRAVENOUS

## 2023-02-26 MED ORDER — KETAMINE HCL 10 MG/ML IJ SOLN
INTRAMUSCULAR | Status: DC | PRN
  Administered 2023-02-26: 20 mg via INTRAVENOUS
  Administered 2023-02-26: 30 mg via INTRAVENOUS

## 2023-02-26 MED ORDER — SODIUM CHLORIDE 0.9% FLUSH: 3.0000 mL | INTRAVENOUS | Status: DC | PRN

## 2023-02-26 MED ORDER — CHLORHEXIDINE GLUCONATE 0.12 % MT SOLN: 15.0000 mL | Freq: Once | OROMUCOSAL | Status: AC

## 2023-02-26 MED ORDER — CEFAZOLIN SODIUM-DEXTROSE 2-4 GM/100ML-% IV SOLN
2.0000 g | INTRAVENOUS | Status: AC
  Administered 2023-02-26: 2 g via INTRAVENOUS

## 2023-02-26 MED ORDER — ACETAMINOPHEN 500 MG PO TABS
ORAL_TABLET | ORAL | Status: AC
  Administered 2023-02-26: 1000 mg via ORAL
  Filled 2023-02-26: qty 2

## 2023-02-26 MED ORDER — SUCCINYLCHOLINE 20MG/ML (10ML) SYRINGE FOR MEDFUSION PUMP - OPTIME
INTRAMUSCULAR | Status: DC | PRN
  Administered 2023-02-26: 120 mg via INTRAVENOUS

## 2023-02-26 MED ORDER — PROPOFOL 1000 MG/100ML IV EMUL
INTRAVENOUS | Status: AC
  Filled 2023-02-26: qty 200

## 2023-02-26 MED ORDER — HYDROMORPHONE HCL 1 MG/ML IJ SOLN
INTRAMUSCULAR | Status: AC
  Filled 2023-02-26: qty 0.5

## 2023-02-26 MED ORDER — THROMBIN 5000 UNITS EX SOLR
OROMUCOSAL | Status: DC | PRN
  Administered 2023-02-26 (×2): 5 mL via TOPICAL

## 2023-02-26 MED ORDER — ACETAMINOPHEN 500 MG PO TABS: 1000.0000 mg | ORAL_TABLET | Freq: Once | ORAL | Status: AC

## 2023-02-26 MED ORDER — SODIUM CHLORIDE 0.9 % IV SOLN: 250.0000 mL | INTRAVENOUS | Status: DC

## 2023-02-26 MED ORDER — KETAMINE HCL 50 MG/5ML IJ SOSY
PREFILLED_SYRINGE | INTRAMUSCULAR | Status: AC
  Filled 2023-02-26: qty 5

## 2023-02-26 MED ORDER — DEXAMETHASONE SODIUM PHOSPHATE 10 MG/ML IJ SOLN
INTRAMUSCULAR | Status: AC
  Filled 2023-02-26: qty 2

## 2023-02-26 MED ORDER — ONDANSETRON HCL 4 MG/2ML IJ SOLN
4.0000 mg | Freq: Four times a day (QID) | INTRAMUSCULAR | Status: DC | PRN
  Administered 2023-02-26 – 2023-02-27 (×2): 4 mg via INTRAVENOUS
  Filled 2023-02-26 (×2): qty 2

## 2023-02-26 MED ORDER — FLUTICASONE PROPIONATE 50 MCG/ACT NA SUSP
2.0000 | Freq: Every day | NASAL | Status: DC
  Administered 2023-02-26 – 2023-02-27 (×2): 2 via NASAL
  Filled 2023-02-26: qty 16

## 2023-02-26 MED ORDER — CELECOXIB 200 MG PO CAPS: 200.0000 mg | ORAL_CAPSULE | Freq: Once | ORAL | Status: AC

## 2023-02-26 SURGICAL SUPPLY — 81 items
APL SKNCLS STERI-STRIP NONHPOA (GAUZE/BANDAGES/DRESSINGS) ×1
BAG COUNTER SPONGE SURGICOUNT (BAG) ×2 IMPLANT
BAG SPNG CNTER NS LX DISP (BAG) ×2
BALL CTTN LRG ABS STRL LF (GAUZE/BANDAGES/DRESSINGS)
BAND INSRT 18 STRL LF DISP RB (MISCELLANEOUS) ×2
BAND RUBBER #18 3X1/16 STRL (MISCELLANEOUS) IMPLANT
BENZOIN TINCTURE PRP APPL 2/3 (GAUZE/BANDAGES/DRESSINGS) ×2 IMPLANT
BIT DRILL NEURO 2X3.1 SFT TUCH (MISCELLANEOUS) ×2 IMPLANT
BLADE CLIPPER SURG (BLADE) IMPLANT
BLADE SURG 11 STRL SS (BLADE) IMPLANT
BLADE ULTRA TIP 2M (BLADE) IMPLANT
BUR MATCHSTICK NEURO 3.0 LAGG (BURR) ×2 IMPLANT
BUR PRECISION FLUTE 6.0 (BURR) IMPLANT
CANISTER SUCT 3000ML PPV (MISCELLANEOUS) ×2 IMPLANT
CLIP TI MEDIUM 6 (CLIP) IMPLANT
COTTONBALL LRG STERILE PKG (GAUZE/BANDAGES/DRESSINGS) IMPLANT
COVER MAYO STAND STRL (DRAPES) IMPLANT
DRAPE CAMERA VIDEO/LASER (DRAPES) IMPLANT
DRAPE LAPAROTOMY 100X72 PEDS (DRAPES) IMPLANT
DRAPE LAPAROTOMY 100X72X124 (DRAPES) IMPLANT
DRAPE MICROSCOPE SLANT 54X150 (MISCELLANEOUS) IMPLANT
DRAPE SURG 17X23 STRL (DRAPES) ×8 IMPLANT
DRILL NEURO 2X3.1 SOFT TOUCH (MISCELLANEOUS) ×1
DRSG OPSITE POSTOP 4X6 (GAUZE/BANDAGES/DRESSINGS) IMPLANT
ELECT BLADE 4.0 EZ CLEAN MEGAD (MISCELLANEOUS) ×1
ELECT REM PT RETURN 9FT ADLT (ELECTROSURGICAL) ×1
ELECTRODE BLDE 4.0 EZ CLN MEGD (MISCELLANEOUS) IMPLANT
ELECTRODE REM PT RTRN 9FT ADLT (ELECTROSURGICAL) ×2 IMPLANT
EVACUATOR SILICONE 100CC (DRAIN) IMPLANT
FEE INTRAOP MONITOR IMPULS NCS (MISCELLANEOUS) IMPLANT
GAUZE 4X4 16PLY ~~LOC~~+RFID DBL (SPONGE) IMPLANT
GAUZE SPONGE 4X4 12PLY STRL (GAUZE/BANDAGES/DRESSINGS) ×2 IMPLANT
GLOVE BIO SURGEON STRL SZ8 (GLOVE) ×2 IMPLANT
GLOVE BIO SURGEON STRL SZ8.5 (GLOVE) ×2 IMPLANT
GLOVE BIOGEL PI IND STRL 7.5 (GLOVE) IMPLANT
GLOVE ECLIPSE 7.0 STRL STRAW (GLOVE) IMPLANT
GLOVE EXAM NITRILE XL STR (GLOVE) IMPLANT
GOWN STRL REUS W/ TWL LRG LVL3 (GOWN DISPOSABLE) IMPLANT
GOWN STRL REUS W/ TWL XL LVL3 (GOWN DISPOSABLE) IMPLANT
GOWN STRL REUS W/TWL LRG LVL3 (GOWN DISPOSABLE) ×1
GOWN STRL REUS W/TWL XL LVL3 (GOWN DISPOSABLE) ×2
HEMOSTAT POWDER KIT SURGIFOAM (HEMOSTASIS) ×2 IMPLANT
INTRAOP MONITOR FEE IMPULS NCS (MISCELLANEOUS) ×1
INTRAOP MONITOR FEE IMPULSE (MISCELLANEOUS) ×1
KIT BASIN OR (CUSTOM PROCEDURE TRAY) ×2 IMPLANT
KIT TURNOVER KIT B (KITS) ×2 IMPLANT
NDL HYPO 21X1.5 SAFETY (NEEDLE) IMPLANT
NDL SPNL 18GX3.5 QUINCKE PK (NEEDLE) IMPLANT
NEEDLE HYPO 21X1.5 SAFETY (NEEDLE) IMPLANT
NEEDLE HYPO 22GX1.5 SAFETY (NEEDLE) ×2 IMPLANT
NEEDLE SPNL 18GX3.5 QUINCKE PK (NEEDLE) IMPLANT
NS IRRIG 1000ML POUR BTL (IV SOLUTION) ×2 IMPLANT
PACK LAMINECTOMY NEURO (CUSTOM PROCEDURE TRAY) ×2 IMPLANT
PAD ARMBOARD 7.5X6 YLW CONV (MISCELLANEOUS) ×6 IMPLANT
PATTIES SURGICAL .25X.25 (GAUZE/BANDAGES/DRESSINGS) IMPLANT
PATTIES SURGICAL .5 X.5 (GAUZE/BANDAGES/DRESSINGS) IMPLANT
PATTIES SURGICAL .5 X3 (DISPOSABLE) IMPLANT
PATTIES SURGICAL 1/4 X 3 (GAUZE/BANDAGES/DRESSINGS) IMPLANT
PATTIES SURGICAL 1X1 (DISPOSABLE) IMPLANT
PROBE MONO 100X0.75X1.9 NCS (MISCELLANEOUS) IMPLANT
SOL ELECTROSURG ANTI STICK (MISCELLANEOUS)
SOLUTION ELECTROSURG ANTI STCK (MISCELLANEOUS) ×2 IMPLANT
SPONGE NEURO XRAY DETECT 1X3 (DISPOSABLE) IMPLANT
SPONGE SURGIFOAM ABS GEL 100 (HEMOSTASIS) ×2 IMPLANT
SPONGE T-LAP 4X18 ~~LOC~~+RFID (SPONGE) IMPLANT
STAPLER SKIN PROX WIDE 3.9 (STAPLE) ×2 IMPLANT
STRIP CLOSURE SKIN 1/2X4 (GAUZE/BANDAGES/DRESSINGS) ×2 IMPLANT
SUT ETHILON 3 0 FSL (SUTURE) ×2 IMPLANT
SUT NURALON 4 0 TR CR/8 (SUTURE) IMPLANT
SUT PROLENE 6 0 BV (SUTURE) IMPLANT
SUT SILK 3 0 TIES 17X18 (SUTURE)
SUT SILK 3-0 18XBRD TIE BLK (SUTURE) IMPLANT
SUT VIC AB 0 CT1 18XCR BRD8 (SUTURE) IMPLANT
SUT VIC AB 0 CT1 8-18 (SUTURE) ×1
SUT VIC AB 1 CT1 18XBRD ANBCTR (SUTURE) ×2 IMPLANT
SUT VIC AB 1 CT1 8-18 (SUTURE) ×1
SUT VIC AB 2-0 CP2 18 (SUTURE) ×2 IMPLANT
TOWEL GREEN STERILE (TOWEL DISPOSABLE) ×2 IMPLANT
TOWEL GREEN STERILE FF (TOWEL DISPOSABLE) ×2 IMPLANT
TRAY FOLEY MTR SLVR 16FR STAT (SET/KITS/TRAYS/PACK) IMPLANT
WATER STERILE IRR 1000ML POUR (IV SOLUTION) ×2 IMPLANT

## 2023-02-26 NOTE — Anesthesia Preprocedure Evaluation (Addendum)
Anesthesia Evaluation  Patient identified by MRN, date of birth, ID band Patient awake    Reviewed: Allergy & Precautions, NPO status , Patient's Chart, lab work & pertinent test results  History of Anesthesia Complications Negative for: history of anesthetic complications  Airway Mallampati: II  TM Distance: >3 FB Neck ROM: Full    Dental  (+) Dental Advisory Given, Teeth Intact   Pulmonary asthma    Pulmonary exam normal        Cardiovascular hypertension, Pt. on medications Normal cardiovascular exam     Neuro/Psych  Headaches PSYCHIATRIC DISORDERS Anxiety Depression     Spinal cord tumor     GI/Hepatic negative GI ROS, Neg liver ROS,,,  Endo/Other   Pre-DM   Renal/GU negative Renal ROS     Musculoskeletal  (+) Arthritis ,    Abdominal   Peds  Hematology negative hematology ROS (+)   Anesthesia Other Findings   Reproductive/Obstetrics  s/p hysterectomy                              Anesthesia Physical Anesthesia Plan  ASA: 2  Anesthesia Plan: General   Post-op Pain Management: Tylenol PO (pre-op)* and Celebrex PO (pre-op)*   Induction: Intravenous  PONV Risk Score and Plan: 3 and Treatment may vary due to age or medical condition, Ondansetron, Dexamethasone and Midazolam  Airway Management Planned: Oral ETT  Additional Equipment: None  Intra-op Plan:   Post-operative Plan: Extubation in OR  Informed Consent: I have reviewed the patients History and Physical, chart, labs and discussed the procedure including the risks, benefits and alternatives for the proposed anesthesia with the patient or authorized representative who has indicated his/her understanding and acceptance.     Dental advisory given  Plan Discussed with: CRNA and Anesthesiologist  Anesthesia Plan Comments:        Anesthesia Quick Evaluation

## 2023-02-26 NOTE — H&P (Signed)
Subjective: The patient is a 57 year old white female who has complained of weakness in her left leg/foot.  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  I discussed the various treatment options with her.  She has decided proceed with surgery.  Past Medical History:  Diagnosis Date   Acute urinary retention 2018   Once after hysterectomy surgery 2012, another spontaneous occurrence 11/2017--Dr. Jeffie Pollock started her on flomax 12/2017--much improved as of 12/22/2017 and 03/2018 urol f/u.   ALLERGIC RHINITIS 03/31/2010   Qualifier: Diagnosis of  By: Alveta Heimlich MD, Cornelia Copa     Asthma    Cervical spondylosis 2011   w/out myelopathy; nerve block injections done 08/2010 and 10/2010 (Dr. Letta Pate)   Colon cancer screening 11/20/2019   2020 and 2024 cologuard neg. Rpt 01/2026   Essential hypertension    Fibroids, submucosal    u/s 07/2010   GAD (generalized anxiety disorder)    with panic attacks   Hyperlipemia, mixed 2022   TLC   MDD (major depressive disorder)    Menorrhagia    Endo ablation 02/14/11   Migraine    plus tension HA's; some occipital HA's associated with her cervicalgia   Plantar fasciitis    Prediabetes    a1c 5.9% Dec 2022.  Jan 2024 a1c 6.4%   Shingles    x 2.  Once L rib cage, once L ear.   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 Arnoldo Morale to resect    Past Surgical History:  Procedure Laterality Date   APPENDECTOMY  12/09/1977   CESAREAN SECTION  02/07/2004   ENDOMETRIAL ABLATION  02/14/2011   ENDOMETRIAL BIOPSY  12/09/2010   Benign, with exogenous hormone effect.  No hyperplasia.   FINGER SURGERY  12/09/1980   Ring finger right hand; no metal/screws in finger.   ROBOTIC ASSISTED LAPAROSCOPIC VAGINAL HYSTERECTOMY WITH FIBROID REMOVAL  02/17/2012   Benign leiomyomata and benign cervix on path    No Known Allergies  Social History   Tobacco Use   Smoking status: Never   Smokeless  tobacco: Never  Substance Use Topics   Alcohol use: Not Currently    Family History  Problem Relation Age of Onset   Heart disease Father        CABG in his 40s   Diabetes Father    Asthma Brother    Cancer Paternal Grandmother        Breast; detected in her 46's at the earliest   Breast cancer Paternal Grandmother    Prior to Admission medications   Medication Sig Start Date End Date Taking? Authorizing Provider  Ascorbic Acid (VITAMIN C PO) Take 1 tablet by mouth daily.   Yes [provider]  Aspirin-Acetaminophen-Caffeine (EXCEDRIN PO) Take 2 tablets by mouth in the morning.   Yes [provider]  cetirizine (ZYRTEC) 10 MG tablet Take 10 mg by mouth daily.   Yes [provider]  Cholecalciferol (VITAMIN D3 PO) Take 1 tablet by mouth daily.   Yes [provider]  Cyanocobalamin (B-12 PO) Take 1 tablet by mouth daily.   Yes [provider]  lisinopril (ZESTRIL) 20 MG tablet Take 1 tablet (20 mg total) by mouth daily. 12/20/22 12/20/23 Yes McGowen, Adrian Blackwater, MD  Menthol, Topical Analgesic, (BIOFREEZE EX) Apply 1 Application topically daily as needed (pain).   Yes [provider]  Multiple Vitamin (MULTIVITAMIN WITH MINERALS) TABS tablet Take 1 tablet by mouth daily.   Yes [provider]  albuterol (VENTOLIN HFA) 108 (90 Base) MCG/ACT inhaler Inhale 1-2 puffs into the lungs every 4 (four) hours as needed for wheezing or shortness of breath. Patient not taking: Reported on 12/20/2022 11/15/22 11/15/23  Tammi Sou, MD  fluticasone [facility]) 50 MCG/ACT nasal spray Place 2 sprays into both nostrils daily. Patient not taking: Reported on 02/19/2023 05/21/22   Perlie Mayo, NP     Review of Systems  Positive ROS: As above  All other systems have been reviewed and were otherwise negative with the exception of those mentioned in the HPI and as above.  Objective: Vital signs in last 24 hours: Temp:  [98.4 F (36.9 C)]  98.4 F (36.9 C) (03/20 1014) Resp:  [18] 18 (03/20 1014) BP: (129)/(102) 129/102 (03/20 1014) SpO2:  [99 %] 99 % (03/20 1014) Weight:  [73.9 kg] 73.9 kg (03/20 1014) Estimated body mass index is 26.31 kg/m as calculated from the following:   Height as of this encounter: 5\' 6"  (1.676 m).   Weight as of this encounter: 73.9 kg.   General Appearance: Alert Head: Normocephalic, without obvious abnormality, atraumatic Eyes: PERRL, conjunctiva/corneas clear, EOM's intact,    Ears: Normal  Throat: Normal  Neck: Supple, Back: unremarkable Lungs: Clear to auscultation bilaterally, respirations unlabored Heart: Regular rate and rhythm, no murmur, rub or gallop Abdomen: Soft, non-tender Extremities: Extremities normal, atraumatic, no cyanosis or edema Skin: unremarkable  NEUROLOGIC:   Mental status: alert and oriented,Motor Exam -she has weakness in the left gastrocnemius.  Sensory Exam - grossly normal Reflexes: Her left ankle jerk reflex is absent. Coordination - grossly normal Gait - grossly normal Balance - grossly normal Cranial Nerves: I: smell Not tested  II: visual acuity  OS: Normal  OD: Normal   II: visual fields Full to confrontation  II: pupils Equal, round, reactive to light  III,VII: ptosis None  III,IV,VI: extraocular muscles  Full ROM  V: mastication Normal  V: facial light touch sensation  Normal  V,VII: corneal reflex  Present  VII: facial muscle function - upper  Normal  VII: facial muscle function - lower Normal  VIII: hearing Not tested  IX: soft palate elevation  Normal  IX,X: gag reflex Present  XI: trapezius strength  5/5  XI: sternocleidomastoid strength 5/5  XI: neck flexion strength  5/5  XII: tongue strength  Normal    Data Review Lab Results  Component Value Date   WBC 5.6 02/21/2023   HGB 14.2 02/21/2023   HCT 42.2 02/21/2023   MCV 92.7 02/21/2023   PLT 232 02/21/2023   Lab Results  Component Value Date   NA 137 02/21/2023   K 3.5  02/21/2023   CL 106 02/21/2023   CO2 24 02/21/2023   BUN 15 02/21/2023   CREATININE 0.74 02/21/2023   GLUCOSE 105 (H) 02/21/2023   No results found for: "INR", "PROTIME"  Assessment/Plan: T12-L1 tumor, left leg weakness: I have discussed the situation with the patient.  I reviewed her imaging studies with her and pointed out the abnormalities.  We have discussed the various treatment options including surgery.  I have described the surgical treatment option of a thoracic or lumbar laminectomy for resection of the tumor.  I have shown her surgical models.  We have discussed the risk, benefits, alternatives, expected postoperative course, and likelihood of achieving our goals with surgery.  I have answered all her questions.  She has decided proceed with surgery.   Ophelia Charter 02/26/2023 11:43 AM

## 2023-02-26 NOTE — Progress Notes (Signed)
Subjective: The patient is alert and pleasant.  She looks well.  Objective: Vital signs in last 24 hours: Temp:  [98.4 F (36.9 C)] 98.4 F (36.9 C) (03/20 1014) Pulse Rate:  [65-78] 71 (03/20 1705) Resp:  [8-22] 18 (03/20 1705) BP: (126-135)/(65-102) 127/86 (03/20 1700) SpO2:  [93 %-100 %] 96 % (03/20 1705) Weight:  [73.9 kg] 73.9 kg (03/20 1014) Estimated body mass index is 26.31 kg/m as calculated from the following:   Height as of this encounter: 5\' 6"  (1.676 m).   Weight as of this encounter: 73.9 kg.   Intake/Output from previous day: No intake/output data recorded. Intake/Output this shift: Total I/O In: 1000 [I.V.:1000] Out: 330 [Urine:230; Blood:100]  Physical exam the patient is alert and pleasant.  She is moving her lower extremities well.  Lab Results: No results for input(s): "WBC", "HGB", "HCT", "PLT" in the last 72 hours. BMET No results for input(s): "NA", "K", "CL", "CO2", "GLUCOSE", "BUN", "CREATININE", "CALCIUM" in the last 72 hours.  Studies/Results: No results found.  Assessment/Plan: The patient is doing well.  I spoke with her mother.  LOS: 0 days     Sara Burton 02/26/2023, 5:27 PM

## 2023-02-26 NOTE — Anesthesia Postprocedure Evaluation (Signed)
Anesthesia Post Note  Patient: Sara Burton  Procedure(s) Performed: Thoracic twelve-Lumbar one Laminectomy For Resection of Intradural Extramedullary Tumor     Patient location during evaluation: PACU Anesthesia Type: General Level of consciousness: awake and alert Pain management: pain level controlled Vital Signs Assessment: post-procedure vital signs reviewed and stable Respiratory status: spontaneous breathing, nonlabored ventilation and respiratory function stable Cardiovascular status: stable and blood pressure returned to baseline Anesthetic complications: no   No notable events documented.  Last Vitals:  Vitals:   02/26/23 1700 02/26/23 1705  BP: 127/86   Pulse: 76 71  Resp: 19 18  Temp:    SpO2: 99% 96%    Last Pain:  Vitals:   02/26/23 1705  TempSrc:   PainSc: [place]

## 2023-02-26 NOTE — Anesthesia Procedure Notes (Signed)
Procedure Name: Intubation Date/Time: 02/26/2023 12:29 PM  Performed by: Elvin So, CRNAPre-anesthesia Checklist: Patient identified, Emergency Drugs available, Suction available and Patient being monitored Patient Re-evaluated:Patient Re-evaluated prior to induction Oxygen Delivery Method: Circle System Utilized Preoxygenation: Pre-oxygenation with 100% oxygen Induction Type: IV induction Ventilation: Mask ventilation without difficulty Laryngoscope Size: Mac and 3 Grade View: Grade I Tube type: Oral Tube size: 7.0 mm Number of attempts: 1 Airway Equipment and Method: Stylet and Oral airway Placement Confirmation: ETT inserted through vocal cords under direct vision, positive ETCO2 and breath sounds checked- equal and bilateral Secured at: 21 cm Tube secured with: Tape Dental Injury: Teeth and Oropharynx as per pre-operative assessment

## 2023-02-26 NOTE — Progress Notes (Signed)
Pt admitted to the unit from pacu via stretcher. Pt ambulated with assistance from stretcher to bed in room. Pt alert and verbally responsive, IV intact and transfusing. Foley intact and unclamped, clear yellow urine in drainage back. Pt honey dsg clean, dry and intact to back surgical incision. MAE x4, pt oriented to the unit and room, VSS, pt in bed with call light within reach. Will continue to closely monitor. Delia Heady RN   02/26/23 1732  Vital Signs  BP 114/68  BP Location Right Arm  Patient Position (if appropriate) Lying  BP Method Automatic  Pulse Rate 72  Pulse Rate Source Monitor  Resp 18  Temp 97.9 F (36.6 C)  Temp Source Oral  Oxygen Therapy  SpO2 99 %  O2 Device Room Air

## 2023-02-26 NOTE — Transfer of Care (Signed)
Immediate Anesthesia Transfer of Care Note  Patient: Sara Burton  Procedure(s) Performed: Thoracic twelve-Lumbar one Laminectomy For Resection of Intradural Extramedullary Tumor  Patient Location: PACU  Anesthesia Type:General  Level of Consciousness: awake and patient cooperative  Airway & Oxygen Therapy: Patient Spontanous Breathing and Patient connected to face mask oxygen  Post-op Assessment: Report given to RN, Post -op Vital signs reviewed and stable, and Patient moving all extremities  Post vital signs: Reviewed and stable  Last Vitals:  Vitals Value Taken Time  BP 132/65 02/26/23 1622  Temp    Pulse 73 02/26/23 1626  Resp 15 02/26/23 1626  SpO2 95 % 02/26/23 1626  Vitals shown include unvalidated device data.  Last Pain:  Vitals:   02/26/23 1014  TempSrc: Oral  PainSc: 0-No pain         Complications: No notable events documented.

## 2023-02-26 NOTE — Op Note (Signed)
Brief history: The patient is a 57 year old white female who was presented with back and left leg pain and weakness.  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  I discussed the various treatment options with her.  She has decided proceed with surgery.  Preop diagnosis: T12-L1 intradural extramedullary tumor  Postop diagnosis: The same  Procedure: T12 and L1 laminectomy for gross total resection of intradural extramedullary tumor using microdissection and neurostimulation.  Surgeon: Dr. Earle Gell  Assistant: Arnetha Massy, NP  Anesthesia: General tracheal  Estimated blood loss: 125 cc  Specimens: Tumor  Drains: None  Complications: None  Description of procedure: The patient was brought to the operating room by the anesthesia team.  General endotracheal anesthesia was induced.  The neuro stimulation electrodes were placed.  The patient was turned to the prone position on the Athalia table.  Her thoracolumbar region was then prepared with Betadine scrub and Betadine solutions.  Sterile drapes were applied.  I injected the area to be incised with Marcaine with epinephrine solution.  I used a scalpel to make any linear midline incision over the thoracolumbar junction.  I used electrocautery to perform a bilateral subperiosteal dissection exposing the spinous processes lamina at T12 and L1.  We used the [facility] and cerebellar retractor for exposure.  We obtained intraoperative radiograph to confirm the location.  I then incised the interspinous ligament at T11-12 T12-L1 and L1-2.  I used a Leksell rongeur to remove the spinous process of T12 and L1.  I used a high-speed drill to perform bilateral T12-L1 and L1-2 laminotomies.  I used the Kerrison punches to complete the T12 and L1-2 laminectomy and removed the ligamentum flavum at T11-12, T12-L1 and L1-2 giving wide exposure of the thecal sac.  We then brought the operative  microscope into the field and under its magnification and illumination we completed the microdissection/tumor resection.  I used a 15 blade scalpel to perform a midline durotomy.  I extended the durotomy and a proximal distal direction using [facility] and a 15 blade scalpel.  The arachnoid remained intact.  We active the dural edges with 4-0 Nurolon sutures.  I then incised the arachnoid.  We used microdissection to dissected through the cauda equina and encountered a large tumor as expected.  We dissected both proximal and distal to the tumor.  There were multiple nerve roots which were adherent to the tumor which we carefully dissected off the tumor.  We encountered 1 nerve which ended in the tumor.  We stimulated this nerve and got no motor action.  I then coagulated thatnerve just proximal to the tumor and ligated with the micro scissors.  This freed up the tumor.  We continued to use micro section to free up the tumor from the surrounding nerves and then remove the tumor.  We obtained a gross total resection.  We then irrigated out the subarachnoid space and the cauda equina.  There was no bleeding.  We then reapproximated the patient is dura with a running 6-0 Prolene suture.  We then had anesthesia Valsalva the patient.  There was a good dural closure.  We did a Gelfoam over the dural closure.  We then removed the retractor and reapproximated the patient's thoracic lumbar fascia with interrupted #1 Vicryl suture.  We reapproximated subcutaneous tissue with interrupted 2-0 Vicryl suture.  We reapproximate the skin with Steri-Strips and benzoin.  The wound was then coated with bacitracin ointment.  A sterile dressing was applied.  The drapes were removed.  The patient was subsequently returned to supine position.  By report all sponge, instrument and needle counts were correct at the end of this case.

## 2023-02-27 ENCOUNTER — Encounter (HOSPITAL_COMMUNITY): Payer: Self-pay | Admitting: Neurosurgery

## 2023-02-27 ENCOUNTER — Other Ambulatory Visit (HOSPITAL_COMMUNITY): Payer: Self-pay

## 2023-02-27 MED ORDER — OXYCODONE-ACETAMINOPHEN 5-325 MG PO TABS: 1.0000 | ORAL_TABLET | ORAL | Status: DC | PRN

## 2023-02-27 MED ORDER — DOCUSATE SODIUM 100 MG PO CAPS
100.0000 mg | ORAL_CAPSULE | Freq: Two times a day (BID) | ORAL | 0 refills | Status: DC
  Filled 2023-02-27: qty 30, 15d supply, fill #0

## 2023-02-27 MED ORDER — OXYCODONE-ACETAMINOPHEN 5-325 MG PO TABS
1.0000 | ORAL_TABLET | ORAL | 0 refills | Status: DC | PRN
  Filled 2023-02-27: qty 30, 3d supply, fill #0

## 2023-02-27 MED ORDER — CYCLOBENZAPRINE HCL 10 MG PO TABS
10.0000 mg | ORAL_TABLET | Freq: Three times a day (TID) | ORAL | 0 refills | Status: DC | PRN
  Filled 2023-02-27: qty 30, 10d supply, fill #0

## 2023-02-27 MED ORDER — ONDANSETRON HCL 4 MG PO TABS
4.0000 mg | ORAL_TABLET | Freq: Four times a day (QID) | ORAL | 0 refills | Status: DC | PRN
  Filled 2023-02-27: qty 20, 5d supply, fill #0

## 2023-02-27 MED FILL — Thrombin For Soln 5000 Unit: CUTANEOUS | Qty: 5000 | Status: AC

## 2023-02-27 NOTE — Plan of Care (Signed)

## 2023-02-27 NOTE — Progress Notes (Signed)
Explained discharge instructions to patient. Reviewed follow up appointment and next medication administration times. Also reviewed education. Patient verbalized having an understanding for instructions given. All belongings are in the patient's possession. IV was removed by the floor staff. No other needs verbalized. Transported downstairs for discharge.

## 2023-02-27 NOTE — Evaluation (Signed)
Physical Therapy Evaluation and Discharge Patient Details Name: Sara Burton MRN: [id number] DOB: 1966-04-11 Today's Date: 02/27/2023  History of Present Illness  57 year old white female presented 3/20 for T12 and L1 laminectomy for gross total resection of intradural extramedullary tumor  Clinical Impression   Patient evaluated by Physical Therapy with no further acute PT needs identified. All education has been completed and the patient has no further questions. Handout provided. Patient has ambulated ~400 ft with nursing x3 this morning and currently with 8/10 back pain, therefore farther ambulation deferred.  PT is signing off. Thank you for this referral.        Recommendations for follow up therapy are one component of a multi-disciplinary discharge planning process, led by the attending physician.  Recommendations may be updated based on patient status, additional functional criteria and insurance authorization.  Follow Up Recommendations No PT follow up      Assistance Recommended at Discharge PRN  Patient can return home with the following  A little help with bathing/dressing/bathroom;Assistance with cooking/housework    Equipment Recommendations None recommended by PT  Recommendations for Other Services       Functional Status Assessment Patient has had a recent decline in their functional status and demonstrates the ability to make significant improvements in function in a reasonable and predictable amount of time.     Precautions / Restrictions Precautions Precautions: Back Precaution Booklet Issued: Yes (comment) Precaution Comments: reviewed handout including precautions related to mobility and to ADLs (no OT order) Required Braces or Orthoses:  (no brace)      Mobility  Bed Mobility Overal bed mobility: Needs Assistance Bed Mobility: Rolling, Sidelying to Sit Rolling: Supervision Sidelying to sit: Modified independent (Device/Increase time)        General bed mobility comments: vc for technique for rolling to avoid twisting    Transfers Overall transfer level: Independent Equipment used: None                    Ambulation/Gait Ambulation/Gait assistance: Independent Gait Distance (Feet): 15 Feet Assistive device: None Gait Pattern/deviations: WFL(Within Functional Limits)       General Gait Details: pt has walked in hallway x 3 already this morning; with pain 8/10 and plans to go home, deferred farther ambulation  Stairs            Wheelchair Mobility    Modified Rankin (Stroke Patients Only)       Balance Overall balance assessment: Independent                                           Pertinent Vitals/Pain Pain Assessment Pain Assessment: 0-10 Pain Score: 8  Pain Location: low back and buttocks Pain Descriptors / Indicators: Aching Pain Intervention(s): Limited activity within patient's tolerance, Monitored during session, Patient requesting pain meds-RN notified    Home Living Family/patient expects to be discharged to:: Private residence Living Arrangements: Spouse/significant other (going to boyfriend's home) Available Help at Discharge: Family;Available 24 hours/day (multiple family members rotating) Type of Home: House Home Access: Ramped entrance       Home Layout: One level Home Equipment: Shower seat;Other (comment) (lift chair)      Prior Function Prior Level of Function : Independent/Modified Independent;Driving                     Hand Dominance  Extremity/Trunk Assessment   Upper Extremity Assessment Upper Extremity Assessment: Overall WFL for tasks assessed    Lower Extremity Assessment Lower Extremity Assessment: Overall WFL for tasks assessed    Cervical / Trunk Assessment Cervical / Trunk Assessment: Normal  Communication   Communication: No difficulties  Cognition Arousal/Alertness: Awake/alert Behavior During Therapy:  WFL for tasks assessed/performed Overall Cognitive Status: Within Functional Limits for tasks assessed                                          General Comments General comments (skin integrity, edema, etc.): Family member present    Exercises     Assessment/Plan    PT Assessment Patient does not need any further PT services  PT Problem List         PT Treatment Interventions      PT Goals (Current goals can be found in the Care Plan section)  Acute Rehab PT Goals Patient Stated Goal: go home today PT Goal Formulation: All assessment and education complete, DC therapy    Frequency       Co-evaluation               AM-PAC PT "6 Clicks" Mobility  Outcome Measure Help needed turning from your back to your side while in a flat bed without using bedrails?: A Little Help needed moving from lying on your back to sitting on the side of a flat bed without using bedrails?: None Help needed moving to and from a bed to a chair (including a wheelchair)?: None Help needed standing up from a chair using your arms (e.g., wheelchair or bedside chair)?: None Help needed to walk in hospital room?: None Help needed climbing 3-5 steps with a railing? : None 6 Click Score: 23    End of Session   Activity Tolerance: Patient limited by pain Patient left: in chair;with call bell/phone within reach;with family/visitor present Nurse Communication: Mobility status;Other (comment) (MD in during session and going to dc pt home today) PT Visit Diagnosis: Difficulty in walking, not elsewhere classified (R26.2)    Time: [id number] PT Time Calculation (min) (ACUTE ONLY): 17 min   Charges:   PT Evaluation $PT Eval Low Complexity: [place], PT Acute Rehabilitation Services  Office [phone]   Rexanne Mano 02/27/2023, 11:20 AM

## 2023-02-27 NOTE — Discharge Summary (Signed)
Physician Discharge Summary  Patient ID: Sara Burton MRN: [id number] DOB/AGE: [birth year] 56 y.o.  Admit date: 02/26/2023 Discharge date: 02/27/2023  Admission Diagnoses: Thoracic spine tumor  Discharge Diagnoses: The same Principal Problem:   Intradural extramedullary spinal tumor   Discharged Condition: good  Hospital Course: I performed a T12 and L1 laminectomy for resection of intradural, extramedullary tumor using microdissection and neural stimulation on 02/26/2023.  The surgery went well.  The patient's postoperative course was unremarkable.  On postoperative day #1 the patient felt well and was urinating well.  She requested discharge home.  She was given verbal and written discharge instructions.  All her questions were answered.  Consults: PT Significant Diagnostic Studies: None Treatments: T12 and L1 laminectomy for resection of intradural extramedullary tumor using microdissection and neural stimulation Discharge Exam: Blood pressure 111/68, pulse 66, temperature 98.3 F (36.8 C), temperature source Oral, resp. rate 18, height 5\' 6"  (1.676 m), weight 73.9 kg, last menstrual period 01/24/2012, SpO2 99 %. The patient is alert and pleasant.  She looks well.  Her strength is grossly normal.  Her dressing is clean and dry.  Disposition: Home  Discharge Instructions     Call MD for:  difficulty breathing, headache or visual disturbances   Complete by: As directed    Call MD for:  extreme fatigue   Complete by: As directed    Call MD for:  hives   Complete by: As directed    Call MD for:  persistant dizziness or light-headedness   Complete by: As directed    Call MD for:  persistant nausea and vomiting   Complete by: As directed    Call MD for:  redness, tenderness, or signs of infection (pain, swelling, redness, odor or green/yellow discharge around incision site)   Complete by: As directed    Call MD for:  severe uncontrolled pain   Complete by: As directed     Call MD for:  temperature >100.4   Complete by: As directed    Diet - low sodium heart healthy   Complete by: As directed    Discharge instructions   Complete by: As directed    Call [phone] for a followup appointment. Take a stool softener while you are using pain medications.   Driving Restrictions   Complete by: As directed    Do not drive for 2 weeks.   Increase activity slowly   Complete by: As directed    Lifting restrictions   Complete by: As directed    Do not lift more than 5 pounds. No excessive bending or twisting.   May shower / Bathe   Complete by: As directed    Remove the dressing for 3 days after surgery.  You may shower, but leave the incision alone.   Remove dressing in 48 hours   Complete by: As directed       Allergies as of 02/27/2023   No Known Allergies      Medication List     TAKE these medications    albuterol 108 (90 Base) MCG/ACT inhaler Commonly known as: VENTOLIN HFA Inhale 1-2 puffs into the lungs every 4 (four) hours as needed for wheezing or shortness of breath.   B-12 PO Take 1 tablet by mouth daily.   BIOFREEZE EX Apply 1 Application topically daily as needed (pain).   cetirizine 10 MG tablet Commonly known as: ZYRTEC Take 10 mg by mouth daily.   cyclobenzaprine 10 MG tablet Commonly known as: FLEXERIL Take  1 tablet (10 mg total) by mouth 3 (three) times daily as needed for muscle spasms.   docusate sodium 100 MG capsule Commonly known as: COLACE Take 1 capsule (100 mg total) by mouth 2 (two) times daily.   EXCEDRIN PO Take 2 tablets by mouth in the morning.   fluticasone 50 MCG/ACT nasal spray Commonly known as: FLONASE Place 2 sprays into both nostrils daily.   lisinopril 20 MG tablet Commonly known as: ZESTRIL Take 1 tablet (20 mg total) by mouth daily.   multivitamin with minerals Tabs tablet Take 1 tablet by mouth daily.   ondansetron 4 MG tablet Commonly known as: ZOFRAN Take 1 tablet (4 mg total)  by mouth every 6 (six) hours as needed for nausea or vomiting.   oxyCODONE-acetaminophen 5-325 MG tablet Commonly known as: PERCOCET/ROXICET Take 1-2 tablets by mouth every 4 (four) hours as needed for moderate pain.   VITAMIN C PO Take 1 tablet by mouth daily.   VITAMIN D3 PO Take 1 tablet by mouth daily.         Signed: Ophelia Charter 02/27/2023, 11:18 AM

## 2023-02-28 ENCOUNTER — Emergency Department (HOSPITAL_COMMUNITY)
Admission: EM | Admit: 2023-02-28 | Discharge: 2023-02-28 | Disposition: A | Discharge status: Home / Self Care | Payer: 59 | Attending: Emergency Medicine | Admitting: Emergency Medicine

## 2023-02-28 ENCOUNTER — Encounter (HOSPITAL_COMMUNITY): Payer: Self-pay | Admitting: Neurosurgery

## 2023-02-28 ENCOUNTER — Other Ambulatory Visit (HOSPITAL_COMMUNITY): Payer: Self-pay

## 2023-02-28 DIAGNOSIS — Z79899 Other long term (current) drug therapy: Secondary | ICD-10-CM | POA: Diagnosis not present

## 2023-02-28 DIAGNOSIS — R339 Retention of urine, unspecified: Secondary | ICD-10-CM | POA: Insufficient documentation

## 2023-02-28 DIAGNOSIS — R338 Other retention of urine: Secondary | ICD-10-CM

## 2023-02-28 LAB — CBC WITH DIFFERENTIAL/PLATELET
Abs Immature Granulocytes: 0.04 10*3/uL (ref 0.00–0.07)
Basophils Absolute: 0 10*3/uL (ref 0.0–0.1)
Basophils Relative: 0 %
Eosinophils Absolute: 0 10*3/uL (ref 0.0–0.5)
Eosinophils Relative: 0 %
HCT: 38 % (ref 36.0–46.0)
Hemoglobin: 12.4 g/dL (ref 12.0–15.0)
Immature Granulocytes: 0 %
Lymphocytes Relative: 15 %
Lymphs Abs: 1.4 10*3/uL (ref 0.7–4.0)
MCH: 30.8 pg (ref 26.0–34.0)
MCHC: 32.6 g/dL (ref 30.0–36.0)
MCV: 94.5 fL (ref 80.0–100.0)
Monocytes Absolute: 0.5 10*3/uL (ref 0.1–1.0)
Monocytes Relative: 6 %
Neutro Abs: 7.4 10*3/uL (ref 1.7–7.7)
Neutrophils Relative %: 79 %
Platelets: 195 10*3/uL (ref 150–400)
RBC: 4.02 MIL/uL (ref 3.87–5.11)
RDW: 12.5 % (ref 11.5–15.5)
WBC: 9.4 10*3/uL (ref 4.0–10.5)
nRBC: 0 % (ref 0.0–0.2)

## 2023-02-28 LAB — BASIC METABOLIC PANEL
Anion gap: 8 (ref 5–15)
BUN: 17 mg/dL (ref 6–20)
CO2: 23 mmol/L (ref 22–32)
Calcium: 8.7 mg/dL — ABNORMAL LOW (ref 8.9–10.3)
Chloride: 106 mmol/L (ref 98–111)
Creatinine, Ser: 0.84 mg/dL (ref 0.44–1.00)
GFR, Estimated: >60 mL/min (ref >60)
Glucose, Bld: 139 mg/dL — ABNORMAL HIGH (ref 70–99)
Potassium: 3.5 mmol/L (ref 3.5–5.1)
Sodium: 137 mmol/L (ref 135–145)

## 2023-02-28 LAB — URINALYSIS, ROUTINE W REFLEX MICROSCOPIC
Bilirubin Urine: NEGATIVE
Glucose, UA: NEGATIVE mg/dL
Hgb urine dipstick: NEGATIVE
Ketones, ur: 20 mg/dL — AB
Leukocytes,Ua: NEGATIVE
Nitrite: NEGATIVE
Protein, ur: NEGATIVE mg/dL
Specific Gravity, Urine: 1.019 (ref 1.005–1.030)
pH: 5 (ref 5.0–8.0)

## 2023-02-28 MED ORDER — GABAPENTIN 300 MG PO CAPS
300.0000 mg | ORAL_CAPSULE | Freq: Three times a day (TID) | ORAL | 0 refills | Status: DC
  Filled 2023-02-28 (×2): qty 21, 7d supply, fill #0

## 2023-02-28 MED ORDER — POLYETHYLENE GLYCOL 3350 17 GM/SCOOP PO POWD
17.0000 g | Freq: Two times a day (BID) | ORAL | 0 refills | Status: DC | PRN
  Filled 2023-02-28: qty 14, 7d supply, fill #0
  Filled 2023-02-28: qty 238, 7d supply, fill #0

## 2023-02-28 NOTE — ED Provider Triage Note (Signed)
Emergency Medicine Provider Triage Evaluation Note  Sara Burton , a 57 y.o. female  was evaluated in triage.  Patient is presenting with a urinary problem.  She reports having spinal surgery recently secondary to a spinal tumor and was discharged from the hospital yesterday.  Ever since then she feels as though she was going to the bathroom every 20 minutes but only peeing a small amount.  No dysuria or hematuria but says it feels "hot."  No saddle anesthesia or bowel dysfunction.  Still ambulatory.  Review of Systems  Positive:  Negative:   Physical Exam  LMP 01/24/2012  Gen:   Awake, no distress   Resp:  Normal effort  MSK:   Moves extremities without difficulty  Other:    Medical Decision Making  Medically screening exam initiated at 12:28 PM.  Appropriate orders placed.  Daneisha Charmian Muff was informed that the remainder of the evaluation will be completed by another provider, this initial triage assessment does not replace that evaluation, and the importance of remaining in the ED until their evaluation is complete.    Will defer any MRI testing to the back.  Per chart review patient also had urinary retention after her hysterectomy in 2018.   Rhae Hammock, PA-C 02/28/23 1229

## 2023-02-28 NOTE — Discharge Instructions (Addendum)
Call the urology office to set up a follow-up appointment next week to reevaluate the Foley catheter.  Follow-up closely with Dr. Arnoldo Morale as well.  If you develop worsening, recurrent, or continued back pain, numbness or weakness in the legs, incontinence of your bowels or bladders, numbness of your buttocks, fever, abdominal pain, or any other new/concerning symptoms then return to the ER for evaluation.

## 2023-02-28 NOTE — ED Provider Notes (Signed)
[place] Provider Note   CSN: [id number] Arrival date & time: 02/28/23  1214     History  Chief Complaint  Patient presents with   Urinary Retention    Sara Burton is a 57 y.o. female.  HPI 57 year old female presents with concern for urinary retention.  On 3/20 she had a T12 and L1 laminectomy with tumor resection.  She states that on day of discharge yesterday she was noticing a little bit of trouble urinating.  She is urinating frequently but only small amounts.  It also hurts a little.  No fevers.  She has back pain from the surgery but no new or worsening back pain.  No weakness or numbness in her legs that she is feeling pain going down both proximal legs.  Home Medications Prior to Admission medications   Medication Sig Start Date End Date Taking? Authorizing Provider  gabapentin (NEURONTIN) 300 MG capsule Take 1 capsule (300 mg total) by mouth 3 (three) times daily. 02/28/23  Yes Sherwood Gambler, MD  polyethylene glycol (MIRALAX / GLYCOLAX) 17 g packet Take 17 g by mouth 2 (two) times daily as needed. 02/28/23  Yes Sherwood Gambler, MD  albuterol (VENTOLIN HFA) 108 (90 Base) MCG/ACT inhaler Inhale 1-2 puffs into the lungs every 4 (four) hours as needed for wheezing or shortness of breath. Patient not taking: Reported on 12/20/2022 11/15/22 11/15/23  Tammi Sou, MD  Ascorbic Acid (VITAMIN C PO) Take 1 tablet by mouth daily.    [provider]  Aspirin-Acetaminophen-Caffeine (EXCEDRIN PO) Take 2 tablets by mouth in the morning.    [provider]  cetirizine (ZYRTEC) 10 MG tablet Take 10 mg by mouth daily.    [provider]  Cholecalciferol (VITAMIN D3 PO) Take 1 tablet by mouth daily.    [provider]  Cyanocobalamin (B-12 PO) Take 1 tablet by mouth daily.    [provider]  cyclobenzaprine (FLEXERIL) 10 MG tablet Take 1 tablet (10 mg total) by mouth 3 (three) times  daily as needed for muscle spasms. 02/27/23   Newman Pies, MD  docusate sodium (COLACE) 100 MG capsule Take 1 capsule (100 mg total) by mouth 2 (two) times daily. 02/27/23   Newman Pies, MD  fluticasone [facility]) 50 MCG/ACT nasal spray Place 2 sprays into both nostrils daily. Patient not taking: Reported on 02/19/2023 05/21/22   Perlie Mayo, NP  lisinopril (ZESTRIL) 20 MG tablet Take 1 tablet (20 mg total) by mouth daily. 12/20/22 12/20/23  McGowen, Adrian Blackwater, MD  Menthol, Topical Analgesic, (BIOFREEZE EX) Apply 1 Application topically daily as needed (pain).    [provider]  Multiple Vitamin (MULTIVITAMIN WITH MINERALS) TABS tablet Take 1 tablet by mouth daily.    [provider]  ondansetron (ZOFRAN) 4 MG tablet Take 1 tablet (4 mg total) by mouth every 6 (six) hours as needed for nausea or vomiting. 02/27/23   Newman Pies, MD  oxyCODONE-acetaminophen (PERCOCET/ROXICET) 5-325 MG tablet Take 1-2 tablets by mouth every 4 (four) hours as needed for moderate pain. 02/27/23   Newman Pies, MD      Allergies    Patient has no known allergies.    Review of Systems   Review of Systems  Constitutional:  Negative for fever.  Gastrointestinal:  Negative for abdominal pain.  Genitourinary:  Positive for difficulty urinating.  Musculoskeletal:  Positive for back pain.  Neurological:  Negative for weakness and numbness.    Physical  Exam Updated Vital Signs BP 115/61   Pulse 87   Temp 99.1 F (37.3 C)   Resp 16   LMP 01/24/2012   SpO2 98%  Physical Exam Vitals and nursing note reviewed.  Constitutional:      Appearance: She is well-developed.  HENT:     Head: Normocephalic and atraumatic.  Pulmonary:     Effort: Pulmonary effort is normal.  Abdominal:     General: There is no distension.     Palpations: Abdomen is soft.     Tenderness: There is no abdominal tenderness.  Skin:    General: Skin is warm and dry.  Neurological:     Mental Status: She  is alert.     Comments: 5/5 strength in bilateral lower extremities.  Good dorsi and plantarflexion of both feet.  Grossly normal sensation bilaterally.     ED Results / Procedures / Treatments   Labs (all labs ordered are listed, but only abnormal results are displayed) Labs Reviewed  BASIC METABOLIC PANEL - Abnormal; Notable for the following components:      Result Value   Glucose, Bld 139 (*)    Calcium 8.7 (*)    All other components within normal limits  URINALYSIS, ROUTINE W REFLEX MICROSCOPIC - Abnormal; Notable for the following components:   APPearance HAZY (*)    Ketones, ur 20 (*)    All other components within normal limits  CBC WITH DIFFERENTIAL/PLATELET    EKG None  Radiology No results found.  Procedures Procedures    Medications Ordered in ED Medications - No data to display  ED Course/ Medical Decision Making/ A&P                             Medical Decision Making Amount and/or Complexity of Data Reviewed Labs:     Details: Normal WBC.  No AKI.  UA not consistent with UTI.   Presents in acute urinary retention.  Her bladder scan was overall indeterminate with around 160 cc but after a Foley catheter was placed we have gotten out over 300 cc.  I think she was having urinary retention.  Urine not consistent with UTI.  I discussed case with her surgeon, Dr. Arnoldo Morale, based on where her tumor was that was excised and with the surgery he would expect a little bit of urinary issues and does not feel like we need an emergent MRI given no weakness or trouble walking.  We will have her follow-up with urology and Dr. Arnoldo Morale will follow closely with her.  She is complaining of some pain going down both of her hips and so he advises 3 times daily gabapentin, 300 mg.  Will give return precautions.  Discussed this with patient and family.        Final Clinical Impression(s) / ED Diagnoses Final diagnoses:  Acute urinary retention    Rx / DC Orders ED  Discharge Orders          Ordered    polyethylene glycol (MIRALAX / GLYCOLAX) 17 g packet  2 times daily PRN        02/28/23 1345    gabapentin (NEURONTIN) 300 MG capsule  3 times daily        02/28/23 1345              Sherwood Gambler, MD 02/28/23 1410

## 2023-02-28 NOTE — ED Triage Notes (Signed)
Pt had laminectomy Wednesday; since then endorses urinary frequency, but having little output; hx same; also endorses constipation since Wednesday; endorses generalized pain 10/10

## 2023-03-02 ENCOUNTER — Encounter: Payer: Self-pay | Admitting: Family Medicine

## 2023-03-03 NOTE — Telephone Encounter (Signed)
Make sure that she has told her neurosurgeon about this. From everything she is saying it sounds like we need to rule out fecal impaction.  This can only be done by examining her. The only thing that has not been tried is an enema (OTC fleets enema). Make sure she is not taking any opioid pain medication--this causes a lot of constipation. Pls offer visit with another provider--I'm already overbooked tomorrow.  Sorry.

## 2023-03-04 ENCOUNTER — Other Ambulatory Visit (HOSPITAL_COMMUNITY): Payer: Self-pay

## 2023-03-04 ENCOUNTER — Encounter: Payer: Self-pay | Admitting: Family Medicine

## 2023-03-04 ENCOUNTER — Telehealth: Payer: Self-pay

## 2023-03-04 LAB — SURGICAL PATHOLOGY

## 2023-03-04 MED ORDER — OXYCODONE-ACETAMINOPHEN 5-325 MG PO TABS
1.0000 | ORAL_TABLET | ORAL | 0 refills | Status: DC
  Filled 2023-03-04: qty 50, 6d supply, fill #0

## 2023-03-04 NOTE — Telephone Encounter (Signed)
Called pt to reschedule HFU. Pt cancelled HFU and declined to reschedule

## 2023-03-05 ENCOUNTER — Encounter: Payer: Self-pay | Admitting: Family Medicine

## 2023-03-06 ENCOUNTER — Inpatient Hospital Stay: Payer: 59 | Admitting: Family Medicine

## 2023-03-13 ENCOUNTER — Ambulatory Visit: Payer: 59 | Admitting: Urology

## 2023-03-13 ENCOUNTER — Encounter: Payer: Self-pay | Admitting: Urology

## 2023-03-13 VITALS — BP 127/84 | HR 73 | Ht 66.0 in | Wt 160.0 lb

## 2023-03-13 DIAGNOSIS — R339 Retention of urine, unspecified: Secondary | ICD-10-CM

## 2023-03-13 MED ORDER — SULFAMETHOXAZOLE-TRIMETHOPRIM 800-160 MG PO TABS: 1.0000 | ORAL_TABLET | Freq: Two times a day (BID) | ORAL | 0 refills | Status: AC

## 2023-03-13 MED ORDER — TAMSULOSIN HCL 0.4 MG PO CAPS: 0.4000 mg | ORAL_CAPSULE | Freq: Every day | ORAL | 1 refills | Status: DC

## 2023-03-13 NOTE — Progress Notes (Signed)
Assessment: 1. Urinary retention     Plan: I personally reviewed the patient's chart including provider notes, lab results. Foley removed today after successful voiding trial. Resume tamsulosin 0.4 mg daily.  Prescription sent. Bactrim DS twice daily x 5 days following catheter removal. Return to office in 7-10 days with bladder scan.  Chief Complaint:  Chief Complaint  Patient presents with   Urinary Retention         History of Present Illness:  Sara Burton is a 57 y.o. female who is seen in consultation from McGowen, Adrian Blackwater, MD for evaluation of urinary retention. She underwent a T12/L1 laminectomy and tumor resection on 02/26/2023.  At the time of her discharge she reported some difficulty urinating.  She was having urinary frequency.  She presented to the emergency room on 02/28/2023 with urinary retention.  A Foley catheter was placed with return of approximately 300 mL.  She was not restarted on tamsulosin.  She reports that her catheter has been draining well.  She has had some gross hematuria associated with increased activity.  She is not currently on antibiotics.  She has a prior history of urinary retention and has seen Dr. Jeffie Pollock.  Her first episode of retention occurred after hysterectomy in 2013.  The last episode occurred in 12/19.  She required a Foley catheter for a volume of approximately 900 mL.  She was previously managed with tamsulosin.  At her visit in January 2020 she was voiding without difficulty and had a postvoid residual of 60 mL.  She was not on tamsulosin at that time.  Past Medical History:  Past Medical History:  Diagnosis Date   Acute urinary retention 2018   Once after hysterectomy surgery 2012, another spontaneous occurrence 11/2017--Dr. Jeffie Pollock started her on flomax 12/2017--much improved as of 12/22/2017 and 03/2018 urol f/u.  Recurrence after spine surgery 02/2023   ALLERGIC RHINITIS 03/31/2010   Qualifier: Diagnosis of  By: Alveta Heimlich MD,  Cornelia Copa     Asthma    Cervical spondylosis 2011   w/out myelopathy; nerve block injections done 08/2010 and 10/2010 (Dr. Letta Pate)   Colon cancer screening 11/20/2019   2020 and 2024 cologuard neg. Rpt 01/2026   Essential hypertension    Fibroids, submucosal    u/s 07/2010   GAD (generalized anxiety disorder)    with panic attacks   Hyperlipemia, mixed 2022   TLC   MDD (major depressive disorder)    Menorrhagia    Endo ablation 02/14/11   Migraine    plus tension HA's; some occipital HA's associated with her cervicalgia   Plantar fasciitis    Prediabetes    a1c 5.9% Dec 2022.  Jan 2024 a1c 6.4%   Shingles    x 2.  Once L rib cage, once L ear.   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 Arnoldo Morale resected this->schwannoma on path    Past Surgical History:  Past Surgical History:  Procedure Laterality Date   APPENDECTOMY  12/09/1977   CESAREAN SECTION  02/07/2004   ENDOMETRIAL ABLATION  02/14/2011   ENDOMETRIAL BIOPSY  12/09/2010   Benign, with exogenous hormone effect.  No hyperplasia.   FINGER SURGERY  12/09/1980   Ring finger right hand; no metal/screws in finger.   LAMINECTOMY N/A 02/26/2023   Procedure: Thoracic twelve-Lumbar one Laminectomy For Resection of Intradural Extramedullary Tumor;  Surgeon: Newman Pies, MD;  Location: [facility];  Service: Neurosurgery;  Laterality: N/A;   ROBOTIC ASSISTED LAPAROSCOPIC VAGINAL HYSTERECTOMY WITH  FIBROID REMOVAL  02/17/2012   Benign leiomyomata and benign cervix on path    Allergies:  No Known Allergies  Family History:  Family History  Problem Relation Age of Onset   Heart disease Father        CABG in his 32s   Diabetes Father    Asthma Brother    Cancer Paternal Grandmother        Breast; detected in her 91's at the earliest   Breast cancer Paternal Grandmother     Social History:  Social History   Tobacco Use   Smoking status: Never   Smokeless tobacco: Never  Vaping  Use   Vaping Use: Never used  Substance Use Topics   Alcohol use: Not Currently   Drug use: No    Review of symptoms:  Constitutional:  Negative for unexplained weight loss, night sweats, fever, chills ENT:  Negative for nose bleeds, sinus pain, painful swallowing CV:  Negative for chest pain, shortness of breath, exercise intolerance, palpitations, loss of consciousness Resp:  Negative for cough, wheezing, shortness of breath GI:  Negative for nausea, vomiting, diarrhea, bloody stools GU:  Positives noted in HPI; otherwise negative for gross hematuria, dysuria, urinary incontinence Neuro:  Negative for seizures, poor balance, limb weakness, slurred speech Psych:  Negative for lack of energy, depression, anxiety Endocrine:  Negative for polydipsia, polyuria, symptoms of hypoglycemia (dizziness, hunger, sweating) Hematologic:  Negative for anemia, purpura, petechia, prolonged or excessive bleeding, use of anticoagulants  Allergic:  Negative for difficulty breathing or choking as a result of exposure to anything; no shellfish allergy; no allergic response (rash/itch) to materials, foods  Physical exam: BP 127/84   Pulse 73   Ht 5\' 6"  (1.676 m)   Wt 160 lb (72.6 kg)   LMP 01/24/2012   BMI 25.82 kg/m  GENERAL APPEARANCE:  Well appearing, well developed, well nourished, NAD HEENT: Atraumatic, Normocephalic, oropharynx clear. NECK: Supple without lymphadenopathy or thyromegaly. LUNGS: Clear to auscultation bilaterally. HEART: Regular Rate and Rhythm without murmurs, gallops, or rubs. ABDOMEN: Soft, non-tender, No Masses. EXTREMITIES: Moves all extremities well.  Without clubbing, cyanosis, or edema. NEUROLOGIC:  Alert and oriented x 3, normal gait, CN II-XII grossly intact.  MENTAL STATUS:  Appropriate. BACK:  Non-tender to palpation.  No CVAT SKIN:  Warm, dry and intact.    Results: None  Procedure:  VOIDING TRIAL  A voiding trial was performed in the office today.   Volume  of sterile water instilled: 120 mL Foley catheter removed intact. Volume voided by patient: 100 mL Instructed to return to office if has not voided by 4 PM

## 2023-03-13 NOTE — Progress Notes (Signed)
Fill and Pull Catheter Removal  Patient is present today for a catheter removal.  Patient was cleaned and prepped in a sterile fashion 116ml of sterile water/ saline was instilled into the bladder when the patient felt the urge to urinate, 28ml of water was then drained from the balloon.  A 16FR foley cath was removed from the bladder no complications were noted .  Patient as then given some time to void on their own.  Patient can void  166ml on their own after some time.  Patient tolerated well.  Performed by: Bradly Bienenstock CMA

## 2023-03-20 ENCOUNTER — Ambulatory Visit (INDEPENDENT_AMBULATORY_CARE_PROVIDER_SITE_OTHER): Payer: 59 | Admitting: Urology

## 2023-03-20 ENCOUNTER — Encounter: Payer: Self-pay | Admitting: Urology

## 2023-03-20 VITALS — BP 121/73 | HR 69

## 2023-03-20 DIAGNOSIS — R339 Retention of urine, unspecified: Secondary | ICD-10-CM | POA: Diagnosis not present

## 2023-03-20 DIAGNOSIS — Z87898 Personal history of other specified conditions: Secondary | ICD-10-CM

## 2023-03-20 LAB — URINALYSIS, ROUTINE W REFLEX MICROSCOPIC
Bilirubin, UA: NEGATIVE
Glucose, UA: NEGATIVE
Ketones, UA: NEGATIVE
Leukocytes,UA: NEGATIVE
Nitrite, UA: NEGATIVE
Protein,UA: NEGATIVE
RBC, UA: NEGATIVE
Specific Gravity, UA: 1.02 (ref 1.005–1.030)
Urobilinogen, Ur: 0.2 mg/dL (ref 0.2–1.0)
pH, UA: 5.5 (ref 5.0–7.5)

## 2023-03-20 LAB — BLADDER SCAN AMB NON-IMAGING

## 2023-03-20 NOTE — Progress Notes (Signed)
Assessment: 1. History of urinary retention     Plan: She is emptying well. Discontinue tamsulosin. Return to office as needed.  Chief Complaint:  Chief Complaint  Patient presents with   Urinary Retention    History of Present Illness:  Sara Burton is a 57 y.o. female who is seen for further evaluation of urinary retention. She underwent a T12/L1 laminectomy and tumor resection on 02/26/2023.  At the time of her discharge she reported some difficulty urinating.  She was having urinary frequency.  She presented to the emergency room on 02/28/2023 with urinary retention.  A Foley catheter was placed with return of approximately 300 mL.  She was not restarted on tamsulosin.  She reports that her catheter has been draining well.  She has had some gross hematuria associated with increased activity.  She is not currently on antibiotics.  She has a prior history of urinary retention and has seen Dr. Annabell Howells.  Her first episode of retention occurred after hysterectomy in 2013.  The last episode occurred in 12/19.  She required a Foley catheter for a volume of approximately 900 mL.  She was previously managed with tamsulosin.  At her visit in January 2020 she was voiding without difficulty and had a postvoid residual of 60 mL.  She was not on tamsulosin at that time.  Her Foley was removed on 03/13/2023 after successful voiding trial.  She returns today for follow-up.  She has been voiding without difficulty since the catheter was removed.  No dysuria or gross hematuria.  She has continued on tamsulosin.  Portions of the above documentation were copied from a prior visit for review purposes only.   Past Medical History:  Past Medical History:  Diagnosis Date   Acute urinary retention 2018   Once after hysterectomy surgery 2012, another spontaneous occurrence 11/2017--Dr. Annabell Howells started her on flomax 12/2017--much improved as of 12/22/2017 and 03/2018 urol f/u.  Recurrence after spine  surgery 02/2023   ALLERGIC RHINITIS 03/31/2010   Qualifier: Diagnosis of  By: Thurmond Butts MD, Dennard Nip     Asthma    Cervical spondylosis 2011   w/out myelopathy; nerve block injections done 08/2010 and 10/2010 (Dr. Wynn Banker)   Colon cancer screening 11/20/2019   2020 and 2024 cologuard neg. Rpt 01/2026   Essential hypertension    Fibroids, submucosal    u/s 07/2010   GAD (generalized anxiety disorder)    with panic attacks   Hyperlipemia, mixed 2022   TLC   MDD (major depressive disorder)    Menorrhagia    Endo ablation 02/14/11   Migraine    plus tension HA's; some occipital HA's associated with her cervicalgia   Plantar fasciitis    Prediabetes    a1c 5.9% Dec 2022.  Jan 2024 a1c 6.4%   Shingles    x 2.  Once L rib cage, once L ear.   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 Lovell Sheehan resected this->schwannoma on path    Past Surgical History:  Past Surgical History:  Procedure Laterality Date   APPENDECTOMY  12/09/1977   CESAREAN SECTION  02/07/2004   ENDOMETRIAL ABLATION  02/14/2011   ENDOMETRIAL BIOPSY  12/09/2010   Benign, with exogenous hormone effect.  No hyperplasia.   FINGER SURGERY  12/09/1980   Ring finger right hand; no metal/screws in finger.   LAMINECTOMY N/A 02/26/2023   Procedure: Thoracic twelve-Lumbar one Laminectomy For Resection of Intradural Extramedullary Tumor;  Surgeon: Tressie Stalker, MD;  Location: [facility]  OR;  Service: Neurosurgery;  Laterality: N/A;   ROBOTIC ASSISTED LAPAROSCOPIC VAGINAL HYSTERECTOMY WITH FIBROID REMOVAL  02/17/2012   Benign leiomyomata and benign cervix on path    Allergies:  No Known Allergies  Family History:  Family History  Problem Relation Age of Onset   Heart disease Father        CABG in his 67s   Diabetes Father    Asthma Brother    Cancer Paternal Grandmother        Breast; detected in her 4's at the earliest   Breast cancer Paternal Grandmother     Social History:   Social History   Tobacco Use   Smoking status: Never   Smokeless tobacco: Never  Vaping Use   Vaping Use: Never used  Substance Use Topics   Alcohol use: Not Currently   Drug use: No    ROS: Constitutional:  Negative for fever, chills, weight loss CV: Negative for chest pain, previous MI, hypertension Respiratory:  Negative for shortness of breath, wheezing, sleep apnea, frequent cough GI:  Negative for nausea, vomiting, bloody stool, GERD  Physical exam: BP 121/73   Pulse 69   LMP 01/24/2012  GENERAL APPEARANCE:  Well appearing, well developed, well nourished, NAD HEENT:  Atraumatic, normocephalic, oropharynx clear NECK:  Supple without lymphadenopathy or thyromegaly ABDOMEN:  Soft, non-tender, no masses EXTREMITIES:  Moves all extremities well, without clubbing, cyanosis, or edema NEUROLOGIC:  Alert and oriented x 3, normal gait, CN II-XII grossly intact MENTAL STATUS:  appropriate BACK:  Non-tender to palpation, No CVAT SKIN:  Warm, dry, and intact  Results: U/A: negative  PVR: 4 ml

## 2023-03-21 ENCOUNTER — Other Ambulatory Visit (HOSPITAL_COMMUNITY): Payer: Self-pay

## 2023-05-13 ENCOUNTER — Other Ambulatory Visit (HOSPITAL_COMMUNITY): Payer: Self-pay

## 2023-06-18 NOTE — Patient Instructions (Signed)

## 2023-06-20 ENCOUNTER — Ambulatory Visit: Payer: 59 | Admitting: Family Medicine

## 2023-06-20 VITALS — BP 105/67 | HR 67 | Wt 162.0 lb

## 2023-06-20 DIAGNOSIS — R7303 Prediabetes: Secondary | ICD-10-CM | POA: Diagnosis not present

## 2023-06-20 DIAGNOSIS — I10 Essential (primary) hypertension: Secondary | ICD-10-CM

## 2023-06-20 LAB — POCT GLYCOSYLATED HEMOGLOBIN (HGB A1C)
HbA1c POC (<> result, manual entry): 5.5 % (ref 4.0–5.6)
HbA1c, POC (controlled diabetic range): 5.5 % (ref 0.0–7.0)
HbA1c, POC (prediabetic range): 5.5 % — AB (ref 5.7–6.4)
Hemoglobin A1C: 5.5 % (ref 4.0–5.6)

## 2023-06-20 LAB — BASIC METABOLIC PANEL
BUN: 20 mg/dL (ref 6–23)
CO2: 26 mEq/L (ref 19–32)
Calcium: 9.5 mg/dL (ref 8.4–10.5)
Chloride: 106 mEq/L (ref 96–112)
Creatinine, Ser: 0.82 mg/dL (ref 0.40–1.20)
GFR: 80.29 mL/min (ref >60.00)
Glucose, Bld: 105 mg/dL — ABNORMAL HIGH (ref 70–99)
Potassium: 4.4 mEq/L (ref 3.5–5.1)
Sodium: 140 mEq/L (ref 135–145)

## 2023-06-20 NOTE — Progress Notes (Signed)
OFFICE VISIT  06/20/2023  CC:  Chief Complaint  Patient presents with   Medical Management of Chronic Issues    Patient is a 57 y.o. female who presents for 56-month follow-up hypertension and prediabetes.  INTERIM HX: Sara Burton feels well. Home blood pressures consistently less than 130/80.  She has made improvements in exercise since having her back surgery and has worked hard on improving her diet.  ROS as above, plus--> no fevers, no CP, no SOB, no wheezing, no cough, no dizziness, no HAs, no rashes, no melena/hematochezia.  No polyuria or polydipsia.  No myalgias or arthralgias.  No focal weakness, paresthesias, or tremors.  No acute vision or hearing abnormalities.  No dysuria or unusual/new urinary urgency or frequency.  No recent changes in lower legs. No n/v/d or abd pain.  No palpitations.    Past Medical History:  Diagnosis Date   Acute urinary retention 2018   Once after hysterectomy surgery 2012, another spontaneous occurrence 11/2017--Dr. Annabell Howells started her on flomax 12/2017--much improved as of 12/22/2017 and 03/2018 urol f/u.  Recurrence after spine surgery 02/2023   ALLERGIC RHINITIS 03/31/2010   Qualifier: Diagnosis of  By: Thurmond Butts MD, Dennard Nip     Asthma    Cervical spondylosis 2011   w/out myelopathy; nerve block injections done 08/2010 and 10/2010 (Dr. Wynn Banker)   Colon cancer screening 11/20/2019   2020 and 2024 cologuard neg. Rpt 01/2026   Essential hypertension    Fibroids, submucosal    u/s 07/2010   GAD (generalized anxiety disorder)    with panic attacks   Hyperlipemia, mixed 2022   TLC   MDD (major depressive disorder)    Menorrhagia    Endo ablation 02/14/11   Migraine    plus tension HA's; some occipital HA's associated with her cervicalgia   Plantar fasciitis    Prediabetes    a1c 5.9% Dec 2022.  Jan 2024 a1c 6.4%   Shingles    x 2.  Once L rib cage, once L ear.   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  Lovell Sheehan resected this->schwannoma on path    Past Surgical History:  Procedure Laterality Date   APPENDECTOMY  12/09/1977   CESAREAN SECTION  02/07/2004   ENDOMETRIAL ABLATION  02/14/2011   ENDOMETRIAL BIOPSY  12/09/2010   Benign, with exogenous hormone effect.  No hyperplasia.   FINGER SURGERY  12/09/1980   Ring finger right hand; no metal/screws in finger.   LAMINECTOMY N/A 02/26/2023   Procedure: Thoracic twelve-Lumbar one Laminectomy For Resection of Intradural Extramedullary Tumor;  Surgeon: Tressie Stalker, MD;  Location: [facility] OR;  Service: Neurosurgery;  Laterality: N/A;   ROBOTIC ASSISTED LAPAROSCOPIC VAGINAL HYSTERECTOMY WITH FIBROID REMOVAL  02/17/2012   Benign leiomyomata and benign cervix on path    Outpatient Medications Prior to Visit  Medication Sig Dispense Refill   albuterol (VENTOLIN HFA) 108 (90 Base) MCG/ACT inhaler Inhale 1-2 puffs into the lungs every 4 (four) hours as needed for wheezing or shortness of breath. 1 each 0   Ascorbic Acid (VITAMIN C PO) Take 1 tablet by mouth daily.     Aspirin-Acetaminophen-Caffeine (EXCEDRIN PO) Take 2 tablets by mouth in the morning.     cetirizine (ZYRTEC) 10 MG tablet Take 10 mg by mouth daily.     Cholecalciferol (VITAMIN D3 PO) Take 1 tablet by mouth daily.     Cyanocobalamin (B-12 PO) Take 1 tablet by mouth daily.     fluticasone (FLONASE) 50 MCG/ACT nasal spray Place 2  sprays into both nostrils daily. 16 g 0   lisinopril (ZESTRIL) 20 MG tablet Take 1 tablet (20 mg total) by mouth daily. 90 tablet 3   Menthol, Topical Analgesic, (BIOFREEZE EX) Apply 1 Application topically daily as needed (pain).     Multiple Vitamin (MULTIVITAMIN WITH MINERALS) TABS tablet Take 1 tablet by mouth daily.     polyethylene glycol powder (GLYCOLAX/MIRALAX) 17 GM/SCOOP powder Take 1 capful (17 g) with water by mouth 2 (two) times daily as needed. 238 g 0   tamsulosin (FLOMAX) 0.4 MG CAPS capsule Take 1 capsule (0.4 mg total) by mouth daily. 30  capsule 1   cyclobenzaprine (FLEXERIL) 10 MG tablet Take 1 tablet (10 mg total) by mouth 3 (three) times daily as needed for muscle spasms. (Patient not taking: Reported on 06/20/2023) 30 tablet 0   docusate sodium (COLACE) 100 MG capsule Take 1 capsule (100 mg total) by mouth 2 (two) times daily. (Patient not taking: Reported on 06/20/2023) 30 capsule 0   gabapentin (NEURONTIN) 300 MG capsule Take 1 capsule (300 mg total) by mouth 3 (three) times daily. (Patient not taking: Reported on 03/20/2023) 21 capsule 0   ondansetron (ZOFRAN) 4 MG tablet Take 1 tablet (4 mg total) by mouth every 6 (six) hours as needed for nausea or vomiting. (Patient not taking: Reported on 06/20/2023) 20 tablet 0   oxyCODONE-acetaminophen (PERCOCET/ROXICET) 5-325 MG tablet Take 1-2 tablets by mouth every 4 (four) hours as needed for moderate pain. (Patient not taking: Reported on 03/13/2023) 30 tablet 0   No facility-administered medications prior to visit.    No Known Allergies  Review of Systems As per HPI  PE:    06/20/2023    8:14 AM 03/20/2023   10:30 AM 03/13/2023    9:12 AM  Vitals with BMI  Height   5\' 6"   Weight 162 lbs  160 lbs  BMI   25.84  Systolic 105 121 409  Diastolic 67 73 84  Pulse 67 69 73   Physical Exam  Gen: Alert, well appearing.  Patient is oriented to person, place, time, and situation. AFFECT: pleasant, lucid thought and speech. CV: RRR, no m/r/g.   LUNGS: CTA bilat, nonlabored resps, good aeration in all lung fields. EXT: no clubbing or cyanosis.  no edema.    LABS:  Last metabolic panel Lab Results  Component Value Date   GLUCOSE 139 (H) 02/28/2023   NA 137 02/28/2023   K 3.5 02/28/2023   CL 106 02/28/2023   CO2 23 02/28/2023   BUN 17 02/28/2023   CREATININE 0.84 02/28/2023   GFRNONAA >60 02/28/2023   CALCIUM 8.7 (L) 02/28/2023   PROT 6.7 12/20/2022   ALBUMIN 4.6 12/20/2022   LABGLOB 2.1 06/03/2018   AGRATIO 2.1 06/03/2018   BILITOT 1.0 12/20/2022   ALKPHOS 67  12/20/2022   AST 17 12/20/2022   ALT 18 12/20/2022   ANIONGAP 8 02/28/2023   Lab Results  Component Value Date   HGBA1C 5.5 06/20/2023   HGBA1C 5.5 06/20/2023   HGBA1C 5.5 (A) 06/20/2023   HGBA1C 5.5 06/20/2023   Lab Results  Component Value Date   CHOL 194 12/20/2022   HDL 51.20 12/20/2022   LDLCALC 108 (H) 12/20/2022   LDLDIRECT 119.0 11/16/2021   TRIG 172.0 (H) 12/20/2022   CHOLHDL 4 12/20/2022   Lab Results  Component Value Date   TSH 1.23 12/20/2022    IMPRESSION AND PLAN:  #1 essential hypertension, doing well on lisinopril 20 mg a day.  Check electrolytes and creatinine today.  2.  Prediabetes.  Doing great with TLC. POC Hba1c improved to 5.5% today!  An After Visit Summary was printed and given to the patient.  FOLLOW UP: No follow-ups on file.  Signed:  Santiago Bumpers, MD           06/20/2023

## 2023-06-30 ENCOUNTER — Other Ambulatory Visit (HOSPITAL_COMMUNITY): Payer: Self-pay

## 2023-07-01 ENCOUNTER — Other Ambulatory Visit (HOSPITAL_COMMUNITY): Payer: Self-pay

## 2023-07-01 DIAGNOSIS — D497 Neoplasm of unspecified behavior of endocrine glands and other parts of nervous system: Secondary | ICD-10-CM | POA: Diagnosis not present

## 2023-07-19 ENCOUNTER — Other Ambulatory Visit (HOSPITAL_COMMUNITY): Payer: Self-pay

## 2023-07-19 ENCOUNTER — Telehealth: Payer: 59 | Admitting: Physician Assistant

## 2023-07-19 ENCOUNTER — Telehealth: Payer: 59

## 2023-07-19 ENCOUNTER — Telehealth: Payer: 59 | Admitting: Family Medicine

## 2023-07-19 DIAGNOSIS — U071 COVID-19: Secondary | ICD-10-CM

## 2023-07-19 MED ORDER — NIRMATRELVIR/RITONAVIR (PAXLOVID)TABLET: 3.0000 | ORAL_TABLET | Freq: Two times a day (BID) | ORAL | 0 refills | Status: AC

## 2023-07-19 MED ORDER — PROMETHAZINE-DM 6.25-15 MG/5ML PO SYRP: 5.0000 mL | ORAL_SOLUTION | Freq: Four times a day (QID) | ORAL | 0 refills | Status: AC | PRN

## 2023-07-19 MED ORDER — PREDNISONE 20 MG PO TABS
20.0000 mg | ORAL_TABLET | Freq: Two times a day (BID) | ORAL | 0 refills | Status: DC
  Filled 2023-07-19: qty 10, 5d supply, fill #0

## 2023-07-19 MED ORDER — PROMETHAZINE-DM 6.25-15 MG/5ML PO SYRP
5.0000 mL | ORAL_SOLUTION | Freq: Four times a day (QID) | ORAL | 0 refills | Status: DC | PRN
  Filled 2023-07-19: qty 118, 6d supply, fill #0

## 2023-07-19 MED ORDER — NIRMATRELVIR/RITONAVIR (PAXLOVID)TABLET
3.0000 | ORAL_TABLET | Freq: Two times a day (BID) | ORAL | 0 refills | Status: DC
  Filled 2023-07-19: qty 30, 5d supply, fill #0

## 2023-07-19 MED ORDER — PREDNISONE 20 MG PO TABS: 20.0000 mg | ORAL_TABLET | Freq: Two times a day (BID) | ORAL | 0 refills | Status: AC

## 2023-07-19 NOTE — Progress Notes (Signed)
I have sent the medication to [facility] in [place], [place].  You will not be charged for this e-visit.   Christeen Douglas, PA-C

## 2023-07-19 NOTE — Progress Notes (Signed)
Patient is requesting original medications be sent to [facility] pharmacy because [facility] pharmacy will not open until Monday.  Medications sent.   I have spent 5 minutes in review of e-visit questionnaire, review and updating patient chart, medical decision making and response to patient.   Reed Pandy, PA-C

## 2023-07-19 NOTE — Patient Instructions (Signed)

## 2023-07-19 NOTE — Progress Notes (Signed)
Virtual Visit Consent   Sara Burton, you are scheduled for a virtual visit with a [facility] provider today. Just as with appointments in the office, your consent must be obtained to participate. Your consent will be active for this visit and any virtual visit you may have with one of our providers in the next 365 days. If you have a MyChart account, a copy of this consent can be sent to you electronically.  As this is a virtual visit, video technology does not allow for your provider to perform a traditional examination. This may limit your provider's ability to fully assess your condition. If your provider identifies any concerns that need to be evaluated in person or the need to arrange testing (such as labs, EKG, etc.), we will make arrangements to do so. Although advances in technology are sophisticated, we cannot ensure that it will always work on either your end or our end. If the connection with a video visit is poor, the visit may have to be switched to a telephone visit. With either a video or telephone visit, we are not always able to ensure that we have a secure connection.  By engaging in this virtual visit, you consent to the provision of healthcare and authorize for your insurance to be billed (if applicable) for the services provided during this visit. Depending on your insurance coverage, you may receive a charge related to this service.  I need to obtain your verbal consent now. Are you willing to proceed with your visit today? Sara Burton has provided verbal consent on 07/19/2023 for a virtual visit (video or telephone). Sara Curio, FNP  Date: 07/19/2023 11:36 AM  Virtual Visit via Video Note   I, Sara Burton, connected with  Sara Burton  ([id number], 56-Sep-1967) on 07/19/23 at 11:30 AM EDT by a video-enabled telemedicine application and verified that I am speaking with the correct person using two identifiers.  Location: Patient: Virtual Visit  Location Patient: Home Provider: Virtual Visit Location Provider: Home Office   I discussed the limitations of evaluation and management by telemedicine and the availability of in person appointments. The patient expressed understanding and agreed to proceed.    History of Present Illness: Sara Burton is a 57 y.o. who identifies as a female who was assigned female at birth, and is being seen today for sx for 2 days started with fatigue, now cough, body aches, head congestion, headaches. History of asthma. In no distress. She has an albuterol inhaler.   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   Neck pain 12/28/2021   GAD (generalized anxiety disorder) 05/18/2014   Panic attacks 05/18/2014   Health maintenance examination 05/10/2013    Allergies: No Known Allergies Medications:  Current Outpatient Medications:    albuterol (VENTOLIN HFA) 108 (90 Base) MCG/ACT inhaler, Inhale 1-2 puffs into the lungs every 4 (four) hours as needed for wheezing or shortness of breath., Disp: 1 each, Rfl: 0   Ascorbic Acid (VITAMIN C PO), Take 1 tablet by mouth daily., Disp: , Rfl:    Aspirin-Acetaminophen-Caffeine (EXCEDRIN PO), Take 2 tablets by mouth in the morning., Disp: , Rfl:    cetirizine (ZYRTEC) 10 MG tablet, Take 10 mg by mouth daily., Disp: , Rfl:    Cholecalciferol (VITAMIN D3 PO), Take 1 tablet by mouth daily., Disp: , Rfl:    Cyanocobalamin (B-12 PO), Take 1  tablet by mouth daily., Disp: , Rfl:    fluticasone (FLONASE) 50 MCG/ACT nasal spray, Place 2 sprays into both nostrils daily., Disp: 16 g, Rfl: 0   lisinopril (ZESTRIL) 20 MG tablet, Take 1 tablet (20 mg total) by mouth daily., Disp: 90 tablet, Rfl: 3   Menthol, Topical Analgesic, (BIOFREEZE EX), Apply 1 Application topically daily as needed (pain)., Disp: , Rfl:    Multiple Vitamin (MULTIVITAMIN WITH MINERALS) TABS tablet, Take 1 tablet by  mouth daily., Disp: , Rfl:    nirmatrelvir/ritonavir (PAXLOVID) 20 x 150 MG & 10 x 100MG  TABS, Take 3 tablets by mouth 2 (two) times daily for 5 days. (Take nirmatrelvir 150 mg two tablets twice daily for 5 days and ritonavir 100 mg one tablet twice daily for 5 days) Patient GFR is 80, Disp: 30 tablet, Rfl: 0   predniSONE (DELTASONE) 20 MG tablet, Take 1 tablet (20 mg total) by mouth 2 (two) times daily with a meal for 5 days., Disp: 10 tablet, Rfl: 0   promethazine-dextromethorphan (PROMETHAZINE-DM) 6.25-15 MG/5ML syrup, Take 5 mLs by mouth 4 (four) times daily as needed for up to 10 days for cough., Disp: 118 mL, Rfl: 0   tamsulosin (FLOMAX) 0.4 MG CAPS capsule, Take 1 capsule (0.4 mg total) by mouth daily., Disp: 30 capsule, Rfl: 1  Observations/Objective: Patient is well-developed, well-nourished in no acute distress.  Resting comfortably  at home.  Head is normocephalic, atraumatic.  No labored breathing.  Speech is clear and coherent with logical content.  Patient is alert and oriented at baseline.    Assessment and Plan: 1. COVID  Increase fluids Humidifier at night Tylenol or ibuprofen Vit D and zinc Quarantine discussed UC if sx persist or worsen  Follow Up Instructions: I discussed the assessment and treatment plan with the patient. The patient was provided an opportunity to ask questions and all were answered. The patient agreed with the plan and demonstrated an understanding of the instructions.  A copy of instructions were sent to the patient via MyChart unless otherwise noted below.     The patient was advised to call back or seek an in-person evaluation if the symptoms worsen or if the condition fails to improve as anticipated.  Time:  I spent 10 minutes with the patient via telehealth technology discussing the above problems/concerns.    Sara Curio, FNP

## 2023-10-23 ENCOUNTER — Other Ambulatory Visit (HOSPITAL_COMMUNITY): Payer: Self-pay

## 2023-10-23 MED ORDER — AMOXICILLIN 500 MG PO CAPS
500.0000 mg | ORAL_CAPSULE | Freq: Four times a day (QID) | ORAL | 0 refills | Status: DC
  Filled 2023-10-23: qty 25, 7d supply, fill #0

## 2023-11-04 DIAGNOSIS — H5213 Myopia, bilateral: Secondary | ICD-10-CM | POA: Diagnosis not present

## 2023-11-17 ENCOUNTER — Telehealth (INDEPENDENT_AMBULATORY_CARE_PROVIDER_SITE_OTHER): Payer: 59 | Admitting: Family Medicine

## 2023-11-17 ENCOUNTER — Other Ambulatory Visit (HOSPITAL_COMMUNITY): Payer: Self-pay

## 2023-11-17 ENCOUNTER — Encounter: Payer: Self-pay | Admitting: Family Medicine

## 2023-11-17 DIAGNOSIS — J4521 Mild intermittent asthma with (acute) exacerbation: Secondary | ICD-10-CM | POA: Diagnosis not present

## 2023-11-17 DIAGNOSIS — J209 Acute bronchitis, unspecified: Secondary | ICD-10-CM

## 2023-11-17 DIAGNOSIS — J0100 Acute maxillary sinusitis, unspecified: Secondary | ICD-10-CM

## 2023-11-17 MED ORDER — HYDROCODONE BIT-HOMATROP MBR 5-1.5 MG/5ML PO SOLN
5.0000 mL | Freq: Two times a day (BID) | ORAL | 0 refills | Status: DC | PRN
  Filled 2023-11-17: qty 120, 6d supply, fill #0

## 2023-11-17 MED ORDER — DOXYCYCLINE HYCLATE 100 MG PO CAPS
100.0000 mg | ORAL_CAPSULE | Freq: Two times a day (BID) | ORAL | 0 refills | Status: AC
  Filled 2023-11-17: qty 14, 7d supply, fill #0

## 2023-11-17 MED ORDER — ALBUTEROL SULFATE HFA 108 (90 BASE) MCG/ACT IN AERS
1.0000 | INHALATION_SPRAY | RESPIRATORY_TRACT | 0 refills | Status: DC | PRN
  Filled 2023-11-17: qty 6.7, 17d supply, fill #0

## 2023-11-17 MED ORDER — PREDNISONE 20 MG PO TABS
40.0000 mg | ORAL_TABLET | Freq: Every day | ORAL | 0 refills | Status: DC
  Filled 2023-11-17: qty 10, 5d supply, fill #0

## 2023-11-17 NOTE — Progress Notes (Signed)
Virtual Visit via Video Note  I connected with Sara Burton  on 11/17/23 at  4:00 PM EST by a video enabled telemedicine application and verified that I am speaking with the correct person using two identifiers.  Location patient: [place] Location provider:work or home office Persons participating in the virtual visit: patient, provider  I discussed the limitations and requested verbal permission for telemedicine visit. The patient expressed understanding and agreed to proceed.  HPI: 57 year old female being seen today for sinus congestion. Onset 1 week ago, sore/scratchy throat and nasal congestion.  Has had progressive nasal congestion and sinus pressure/pain. Lots of coughing as well, especially when trying to take a deep breath.  No chest pain.  No fever. She is somewhat fatigued.  No bodyaches.  No nausea, vomiting, or diarrhea. She has tried multiple over-the-counter cold/cough medications without improvement.  ROS: See pertinent positives and negatives per HPI.  Past Medical History:  Diagnosis Date   Acute urinary retention 2018   Once after hysterectomy surgery 2012, another spontaneous occurrence 11/2017--Dr. Annabell Howells started her on flomax 12/2017--much improved as of 12/22/2017 and 03/2018 urol f/u.  Recurrence after spine surgery 02/2023   ALLERGIC RHINITIS 03/31/2010   Qualifier: Diagnosis of  By: Thurmond Butts MD, Dennard Nip     Asthma    Cervical spondylosis 2011   w/out myelopathy; nerve block injections done 08/2010 and 10/2010 (Dr. Wynn Banker)   Colon cancer screening 11/20/2019   2020 and 2024 cologuard neg. Rpt 01/2026   Essential hypertension    Fibroids, submucosal    u/s 07/2010   GAD (generalized anxiety disorder)    with panic attacks   Hyperlipemia, mixed 2022   TLC   MDD (major depressive disorder)    Menorrhagia    Endo ablation 02/14/11   Migraine    plus tension HA's; some occipital HA's associated with her cervicalgia   Plantar fasciitis    Prediabetes    a1c 5.9% Dec 2022.  Jan  2024 a1c 6.4%   Shingles    x 2.  Once L rib cage, once L ear.   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 Lovell Sheehan resected this->schwannoma on path    Past Surgical History:  Procedure Laterality Date   APPENDECTOMY  12/09/1977   CESAREAN SECTION  02/07/2004   ENDOMETRIAL ABLATION  02/14/2011   ENDOMETRIAL BIOPSY  12/09/2010   Benign, with exogenous hormone effect.  No hyperplasia.   FINGER SURGERY  12/09/1980   Ring finger right hand; no metal/screws in finger.   LAMINECTOMY N/A 02/26/2023   Procedure: Thoracic twelve-Lumbar one Laminectomy For Resection of Intradural Extramedullary Tumor;  Surgeon: Tressie Stalker, MD;  Location: [facility] OR;  Service: Neurosurgery;  Laterality: N/A;   ROBOTIC ASSISTED LAPAROSCOPIC VAGINAL HYSTERECTOMY WITH FIBROID REMOVAL  02/17/2012   Benign leiomyomata and benign cervix on path     Current Outpatient Medications:    amoxicillin (AMOXIL) 500 MG capsule, Take 2 capsules (1000 mg total) by now, then 1 capsule (500 mg total) by mouth every 6 (six) hours until gone., Disp: 25 capsule, Rfl: 0   Aspirin-Acetaminophen-Caffeine (EXCEDRIN PO), Take 2 tablets by mouth in the morning., Disp: , Rfl:    cetirizine (ZYRTEC) 10 MG tablet, Take 10 mg by mouth daily., Disp: , Rfl:    Cholecalciferol (VITAMIN D3 PO), Take 1 tablet by mouth daily., Disp: , Rfl:    Cyanocobalamin (B-12 PO), Take 1 tablet by mouth daily., Disp: , Rfl:    fluticasone (FLONASE) 50 MCG/ACT nasal spray, Place  2 sprays into both nostrils daily., Disp: 16 g, Rfl: 0   lisinopril (ZESTRIL) 20 MG tablet, Take 1 tablet (20 mg total) by mouth daily., Disp: 90 tablet, Rfl: 3   Menthol, Topical Analgesic, (BIOFREEZE EX), Apply 1 Application topically daily as needed (pain)., Disp: , Rfl:    Multiple Vitamin (MULTIVITAMIN WITH MINERALS) TABS tablet, Take 1 tablet by mouth daily., Disp: , Rfl:    tamsulosin (FLOMAX) 0.4 MG CAPS capsule, Take 1 capsule (0.4  mg total) by mouth daily., Disp: 30 capsule, Rfl: 1   albuterol (VENTOLIN HFA) 108 (90 Base) MCG/ACT inhaler, Inhale 1-2 puffs into the lungs every 4 (four) hours as needed for wheezing or shortness of breath., Disp: 1 each, Rfl: 0  EXAM:  VITALS per patient if applicable:     06/20/2023    8:14 AM 03/20/2023   10:30 AM 03/13/2023    9:12 AM  Vitals with BMI  Height   5\' 6"   Weight 162 lbs  160 lbs  BMI   25.84  Systolic 105 121 540  Diastolic 67 73 84  Pulse 67 69 73     GENERAL: alert, oriented, appears well and in no acute distress  HEENT: atraumatic, conjunttiva clear, no obvious abnormalities on inspection of external nose and ears  NECK: normal movements of the head and neck  LUNGS: on inspection no signs of respiratory distress, breathing rate appears normal, no obvious gross SOB, gasping or wheezing  CV: no obvious cyanosis  MS: moves all visible extremities without noticeable abnormality  PSYCH/NEURO: pleasant and cooperative, no obvious depression or anxiety, speech and thought processing grossly intact  LABS: none today    Chemistry      Component Value Date/Time   NA 140 06/20/2023 0836   NA 141 06/03/2018 0825   K 4.4 06/20/2023 0836   CL 106 06/20/2023 0836   CO2 26 06/20/2023 0836   BUN 20 06/20/2023 0836   BUN 16 06/03/2018 0825   CREATININE 0.82 06/20/2023 0836   CREATININE 0.86 11/30/2020 0855      Component Value Date/Time   CALCIUM 9.5 06/20/2023 0836   ALKPHOS 67 12/20/2022 0851   AST 17 12/20/2022 0851   ALT 18 12/20/2022 0851   BILITOT 1.0 12/20/2022 0851   BILITOT 0.9 06/03/2018 0825      ASSESSMENT AND PLAN:  Discussed the following assessment and plan:  URI/sinusitis with acute bronchitis. Doxycycline 100 twice daily x 7 days. Prednisone 40 mg a day x 5 days. Hycodan syrup, 1 to 2 teaspoons twice daily as needed, 120 mL. Albuterol inhaler refill, 1 to 2 puffs every 6 hours as needed.   I discussed the assessment and treatment  plan with the patient. The patient was provided an opportunity to ask questions and all were answered. The patient agreed with the plan and demonstrated an understanding of the instructions.   F/u: if not significantly improving in 4 to 5 days  Signed:  Santiago Bumpers, MD           11/17/2023

## 2023-12-24 ENCOUNTER — Encounter: Payer: Self-pay | Admitting: Family Medicine

## 2023-12-24 ENCOUNTER — Ambulatory Visit: Payer: 59 | Admitting: Family Medicine

## 2023-12-24 ENCOUNTER — Other Ambulatory Visit (HOSPITAL_COMMUNITY): Payer: Self-pay

## 2023-12-24 VITALS — BP 92/68 | HR 70 | Ht 66.0 in | Wt 163.6 lb

## 2023-12-24 DIAGNOSIS — R7303 Prediabetes: Secondary | ICD-10-CM

## 2023-12-24 DIAGNOSIS — G4762 Sleep related leg cramps: Secondary | ICD-10-CM

## 2023-12-24 DIAGNOSIS — I10 Essential (primary) hypertension: Secondary | ICD-10-CM | POA: Diagnosis not present

## 2023-12-24 DIAGNOSIS — Z1231 Encounter for screening mammogram for malignant neoplasm of breast: Secondary | ICD-10-CM | POA: Diagnosis not present

## 2023-12-24 DIAGNOSIS — Z0000 Encounter for general adult medical examination without abnormal findings: Secondary | ICD-10-CM | POA: Diagnosis not present

## 2023-12-24 LAB — COMPREHENSIVE METABOLIC PANEL
ALT: 18 U/L (ref 0–35)
AST: 18 U/L (ref 0–37)
Albumin: 4.6 g/dL (ref 3.5–5.2)
Alkaline Phosphatase: 75 U/L (ref 39–117)
BUN: 22 mg/dL (ref 6–23)
CO2: 29 meq/L (ref 19–32)
Calcium: 9.5 mg/dL (ref 8.4–10.5)
Chloride: 106 meq/L (ref 96–112)
Creatinine, Ser: 0.87 mg/dL (ref 0.40–1.20)
GFR: 74.51 mL/min (ref >60.00)
Glucose, Bld: 127 mg/dL — ABNORMAL HIGH (ref 70–99)
Potassium: 5.1 meq/L (ref 3.5–5.1)
Sodium: 142 meq/L (ref 135–145)
Total Bilirubin: 0.9 mg/dL (ref 0.2–1.2)
Total Protein: 6.6 g/dL (ref 6.0–8.3)

## 2023-12-24 LAB — LIPID PANEL
Cholesterol: 196 mg/dL (ref 0–200)
HDL: 63 mg/dL (ref >39.00)
LDL Cholesterol: 108 mg/dL — ABNORMAL HIGH (ref 0–99)
NonHDL: 132.76
Total CHOL/HDL Ratio: 3
Triglycerides: 125 mg/dL (ref 0.0–149.0)
VLDL: 25 mg/dL (ref 0.0–40.0)

## 2023-12-24 LAB — TSH: TSH: 1.53 u[IU]/mL (ref 0.35–5.50)

## 2023-12-24 LAB — CBC
HCT: 44.5 % (ref 36.0–46.0)
Hemoglobin: 14.8 g/dL (ref 12.0–15.0)
MCHC: 33.3 g/dL (ref 30.0–36.0)
MCV: 95.1 fL (ref 78.0–100.0)
Platelets: 264 10*3/uL (ref 150.0–400.0)
RBC: 4.67 Mil/uL (ref 3.87–5.11)
RDW: 13.3 % (ref 11.5–15.5)
WBC: 5.3 10*3/uL (ref 4.0–10.5)

## 2023-12-24 LAB — HEMOGLOBIN A1C: Hgb A1c MFr Bld: 6.6 % — ABNORMAL HIGH (ref 4.6–6.5)

## 2023-12-24 MED ORDER — CYCLOBENZAPRINE HCL 10 MG PO TABS
10.0000 mg | ORAL_TABLET | Freq: Every evening | ORAL | 0 refills | Status: DC | PRN
  Filled 2023-12-24: qty 30, 30d supply, fill #0

## 2023-12-24 NOTE — Progress Notes (Signed)
 Office Note 12/24/2023  CC:  Chief Complaint  Patient presents with   Annual Exam    Pt is fasting.    Patient is a 58 y.o. female who is here for annual health maintenance exam and 72-month follow-up hypertension and prediabetes. A/P as of last visit: "#1 essential hypertension, doing well on lisinopril  20 mg a day. Check electrolytes and creatinine today.   2.  Prediabetes.  Doing great with TLC. POC Hba1c improved to 5.5% today"  INTERIM HX: Feeling well. Diet "not bad". Exercise not so good lately.  Occ home bp check is normal or in low-normal range. No dizziness or fatigue.  Past Medical History:  Diagnosis Date   Acute urinary retention 2018   Once after hysterectomy surgery 2012, another spontaneous occurrence 11/2017--Dr. Inga Manges started her on flomax  12/2017--much improved as of 12/22/2017 and 03/2018 urol f/u.  Recurrence after spine surgery 02/2023   ALLERGIC RHINITIS 03/31/2010   Qualifier: Diagnosis of  By: Harvest Lineman MD, Madge Schiller     Asthma    Cervical spondylosis 2011   w/out myelopathy; nerve block injections done 08/2010 and 10/2010 (Dr. Sharl Davies)   Colon cancer screening 11/20/2019   2020 and 2024 cologuard neg. Rpt 01/2026   Essential hypertension    Fibroids, submucosal    u/s 07/2010   GAD (generalized anxiety disorder)    with panic attacks   Hyperlipemia, mixed 2022   TLC   MDD (major depressive disorder)    Menorrhagia    Endo ablation 02/14/11   Migraine    plus tension HA's; some occipital HA's associated with her cervicalgia   Plantar fasciitis    Prediabetes    a1c 5.9% Dec 2022.  Jan 2024 a1c 6.4%   Shingles    x 2.  Once L rib cage, once L ear.   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 Larrie Po resected this->schwannoma on path    Past Surgical History:  Procedure Laterality Date   APPENDECTOMY  12/09/1977   CESAREAN SECTION  02/07/2004   ENDOMETRIAL ABLATION  02/14/2011   ENDOMETRIAL BIOPSY   12/09/2010   Benign, with exogenous hormone effect.  No hyperplasia.   FINGER SURGERY  12/09/1980   Ring finger right hand; no metal/screws in finger.   LAMINECTOMY N/A 02/26/2023   Procedure: Thoracic twelve-Lumbar one Laminectomy For Resection of Intradural Extramedullary Tumor;  Surgeon: Garry Kansas, MD;  Location: [facility] OR;  Service: Neurosurgery;  Laterality: N/A;   ROBOTIC ASSISTED LAPAROSCOPIC VAGINAL HYSTERECTOMY WITH FIBROID REMOVAL  02/17/2012   Benign leiomyomata and benign cervix on path    Family History  Problem Relation Age of Onset   Heart disease Father        CABG in his 47s   Diabetes Father    Asthma Brother    Cancer Paternal Grandmother        Breast; detected in her 35's at the earliest   Breast cancer Paternal Grandmother     Social History   Socioeconomic History   Marital status: Divorced    Spouse name: Not on file   Number of children: 1   Years of education: Not on file   Highest education level: Some college, no degree  Occupational History   Not on file  Tobacco Use   Smoking status: Never   Smokeless tobacco: Never  Vaping Use   Vaping status: Never Used  Substance and Sexual Activity   Alcohol use: Not Currently   Drug use: No   Sexual activity:  Yes  Other Topics Concern   Not on file  Social History Narrative   Divorced, one son.   Originally from [place] .   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   No T/A/Ds.   Has one brother and 1 sister.   Social Drivers of Corporate investment banker Strain: Low Risk  (06/20/2023)   Overall Financial Resource Strain (CARDIA)    Difficulty of Paying Living Expenses: Not very hard  Food Insecurity: No Food Insecurity (06/20/2023)   Hunger Vital Sign    Worried About Running Out of Food in the Last Year: Never true    Ran Out of Food in the Last Year: Never true  Transportation Needs: No Transportation Needs (06/20/2023)   PRAPARE - Administrator, Civil Service (Medical): No     Lack of Transportation (Non-Medical): No  Physical Activity: Insufficiently Active (06/20/2023)   Exercise Vital Sign    Days of Exercise per Week: 2 days    Minutes of Exercise per Session: 30 min  Stress: No Stress Concern Present (06/20/2023)   [employer] of Occupational Health - Occupational Stress Questionnaire    Feeling of Stress : Only a little  Social Connections: Moderately Isolated (06/20/2023)   Social Connection and Isolation Panel [NHANES]    Frequency of Communication with Friends and Family: More than three times a week    Frequency of Social Gatherings with Friends and Family: More than three times a week    Attends Religious Services: More than 4 times per year    Active Member of [employer] or Organizations: No    Attends Engineer, structural: Not on file    Marital Status: Divorced  Catering manager Violence: Not on file    Outpatient Medications Prior to Visit  Medication Sig Dispense Refill   albuterol  (VENTOLIN  HFA) 108 (90 Base) MCG/ACT inhaler Inhale 1-2 puffs into the lungs every 4 (four) hours as needed for wheezing or shortness of breath. 6.7 g 0   Aspirin-Acetaminophen -Caffeine (EXCEDRIN PO) Take 2 tablets by mouth in the morning.     cetirizine (ZYRTEC) 10 MG tablet Take 10 mg by mouth daily.     Cholecalciferol (VITAMIN D3 PO) Take 1 tablet by mouth daily.     Cyanocobalamin (B-12 PO) Take 1 tablet by mouth daily.     fluticasone  (FLONASE ) 50 MCG/ACT nasal spray Place 2 sprays into both nostrils daily. 16 g 0   lisinopril  (ZESTRIL ) 20 MG tablet Take 1 tablet (20 mg total) by mouth daily. 90 tablet 3   Menthol , Topical Analgesic, (BIOFREEZE EX) Apply 1 Application topically daily as needed (pain).     Multiple Vitamin (MULTIVITAMIN WITH MINERALS) TABS tablet Take 1 tablet by mouth daily.     tamsulosin  (FLOMAX ) 0.4 MG CAPS capsule Take 1 capsule (0.4 mg total) by mouth daily. 30 capsule 1   HYDROcodone  bit-homatropine (HYCODAN) 5-1.5 MG/5ML  syrup Take 5-10 mLs by mouth 2 (two) times daily as needed for cough. (Patient not taking: Reported on 12/24/2023) 120 mL 0   predniSONE  (DELTASONE ) 20 MG tablet Take 2 tablets (40 mg total) by mouth daily for 5 days. 10 tablet 0   No facility-administered medications prior to visit.    No Known Allergies  Review of Systems  Constitutional:  Negative for appetite change, chills, fatigue and fever.  HENT:  Negative for congestion, dental problem, ear pain and sore throat.   Eyes:  Negative for discharge, redness and visual disturbance.  Respiratory:  Negative for  cough, chest tightness, shortness of breath and wheezing.   Cardiovascular:  Negative for chest pain, palpitations and leg swelling.  Gastrointestinal:  Negative for abdominal pain, blood in stool, diarrhea, nausea and vomiting.  Genitourinary:  Negative for difficulty urinating, dysuria, flank pain, frequency, hematuria and urgency.  Musculoskeletal:  Negative for arthralgias, back pain, joint swelling, myalgias and neck stiffness.  Skin:  Negative for pallor and rash.  Neurological:  Negative for dizziness, speech difficulty, weakness and headaches.  Hematological:  Negative for adenopathy. Does not bruise/bleed easily.  Psychiatric/Behavioral:  Negative for confusion and sleep disturbance. The patient is not nervous/anxious.     PE;    12/24/2023    8:07 AM 06/20/2023    8:14 AM 03/20/2023   10:30 AM  Vitals with BMI  Height 5\' 6"     Weight 163 lbs 10 oz 162 lbs   BMI 26.42    Systolic 92 105 121  Diastolic 68 67 73  Pulse 70 67 69    Gen: Alert, well appearing.  Patient is oriented to person, place, time, and situation. AFFECT: pleasant, lucid thought and speech. ENT: Ears: EACs clear, normal epithelium.  TMs with good light reflex and landmarks bilaterally.  Eyes: no injection, icteris, swelling, or exudate.  EOMI, PERRLA. Nose: no drainage or turbinate edema/swelling.  No injection or focal lesion.  Mouth: lips  without lesion/swelling.  Oral mucosa pink and moist.  Dentition intact and without obvious caries or gingival swelling.  Oropharynx without erythema, exudate, or swelling.  Neck: supple/nontender.  No LAD, mass, or TM.  Carotid pulses 2+ bilaterally, without bruits. CV: RRR, no m/r/g.   LUNGS: CTA bilat, nonlabored resps, good aeration in all lung fields. ABD: soft, NT, ND, BS normal.  No hepatospenomegaly or mass.  No bruits. EXT: no clubbing, cyanosis, or edema.  Musculoskeletal: no joint swelling, erythema, warmth, or tenderness.  ROM of all joints intact. Skin - no sores or suspicious lesions or rashes or color changes  Pertinent labs:  Lab Results  Component Value Date   TSH 1.23 12/20/2022   Lab Results  Component Value Date   WBC 9.4 02/28/2023   HGB 12.4 02/28/2023   HCT 38.0 02/28/2023   MCV 94.5 02/28/2023   PLT 195 02/28/2023   Lab Results  Component Value Date   CREATININE 0.82 06/20/2023   BUN 20 06/20/2023   NA 140 06/20/2023   K 4.4 06/20/2023   CL 106 06/20/2023   CO2 26 06/20/2023   Lab Results  Component Value Date   ALT 18 12/20/2022   AST 17 12/20/2022   ALKPHOS 67 12/20/2022   BILITOT 1.0 12/20/2022   Lab Results  Component Value Date   CHOL 194 12/20/2022   Lab Results  Component Value Date   HDL 51.20 12/20/2022   Lab Results  Component Value Date   LDLCALC 108 (H) 12/20/2022   Lab Results  Component Value Date   TRIG 172.0 (H) 12/20/2022   Lab Results  Component Value Date   CHOLHDL 4 12/20/2022   Lab Results  Component Value Date   HGBA1C 5.5 06/20/2023   HGBA1C 5.5 06/20/2023   HGBA1C 5.5 (A) 06/20/2023   HGBA1C 5.5 06/20/2023   ASSESSMENT AND PLAN:   No problem-specific Assessment & Plan notes found for this encounter.  1 health maintenance exam: Reviewed age and gender appropriate health maintenance issues (prudent diet, regular exercise, health risks of tobacco and excessive alcohol, use of seatbelts, fire alarms in  home, use of  sunscreen).  Also reviewed age and gender appropriate health screening as well as vaccine recommendations. Vaccines: Prevnar 20-->we are out of this today.  She'll return soon for nurse visit.  Otherwise UTD. Labs: fasting HP + Hba1c (prediabetes) ordered. Cervical ca screening: uterus and cervix surgically excised-->benign.  No further cerv ca screening indicated. Breast ca screening: mammo normal 07/2022->ordered today. Colon ca screening: Plan repeat Cologuard in February 2027  2 essential hypertension, doing well on lisinopril  20 mg a day. Check electrolytes and creatinine today.   3.  Prediabetes.  Doing great with TLC. Hba1c and fasting glucose today.  4.  Nocturnal leg cramps.  Onset after 02/2023 lumbar discectomy. Occ use of flexeril  is helpful.  Rx for #30 today.  An After Visit Summary was printed and given to the patient.  FOLLOW UP:  Return in about 6 months (around 06/22/2024) for routine chronic illness f/u.  Signed:  Arletha Lady, MD           12/24/2023

## 2023-12-24 NOTE — Patient Instructions (Signed)

## 2023-12-25 ENCOUNTER — Other Ambulatory Visit (HOSPITAL_COMMUNITY): Payer: Self-pay

## 2023-12-25 ENCOUNTER — Encounter: Payer: Self-pay | Admitting: Family Medicine

## 2023-12-25 ENCOUNTER — Other Ambulatory Visit: Payer: Self-pay | Admitting: Family Medicine

## 2023-12-25 DIAGNOSIS — E119 Type 2 diabetes mellitus without complications: Secondary | ICD-10-CM

## 2023-12-25 MED ORDER — METFORMIN HCL 500 MG PO TABS
500.0000 mg | ORAL_TABLET | Freq: Two times a day (BID) | ORAL | 3 refills | Status: DC
  Filled 2023-12-25: qty 180, 90d supply, fill #0

## 2023-12-26 ENCOUNTER — Ambulatory Visit: Payer: 59

## 2024-01-07 ENCOUNTER — Ambulatory Visit: Payer: 59 | Admitting: Urgent Care

## 2024-01-07 ENCOUNTER — Other Ambulatory Visit: Payer: Self-pay | Admitting: Family Medicine

## 2024-01-07 ENCOUNTER — Encounter: Payer: Self-pay | Admitting: Urgent Care

## 2024-01-07 ENCOUNTER — Ambulatory Visit: Payer: Self-pay | Admitting: Family Medicine

## 2024-01-07 ENCOUNTER — Ambulatory Visit (HOSPITAL_BASED_OUTPATIENT_CLINIC_OR_DEPARTMENT_OTHER)
Admission: RE | Admit: 2024-01-07 | Discharge: 2024-01-07 | Disposition: A | Discharge status: Home / Self Care | Payer: 59 | Source: Ambulatory Visit | Attending: Urgent Care | Admitting: Urgent Care

## 2024-01-07 VITALS — BP 144/83 | HR 102 | Temp 103.1°F | Wt 163.1 lb

## 2024-01-07 DIAGNOSIS — R059 Cough, unspecified: Secondary | ICD-10-CM | POA: Diagnosis not present

## 2024-01-07 DIAGNOSIS — R509 Fever, unspecified: Secondary | ICD-10-CM | POA: Diagnosis not present

## 2024-01-07 DIAGNOSIS — R52 Pain, unspecified: Secondary | ICD-10-CM | POA: Diagnosis not present

## 2024-01-07 DIAGNOSIS — R051 Acute cough: Secondary | ICD-10-CM

## 2024-01-07 LAB — POCT INFLUENZA A/B
Influenza A, POC: NEGATIVE
Influenza B, POC: NEGATIVE

## 2024-01-07 LAB — POC COVID19 BINAXNOW: SARS Coronavirus 2 Ag: NEGATIVE

## 2024-01-07 NOTE — Progress Notes (Signed)
Established Patient Office Visit  Subjective:  Patient ID: Sara Burton, female    DOB: 1966-03-25  Age: 58 y.o. MRN: [id number]  Chief Complaint  Patient presents with   Fever    Fever, cough, chills and body aches that started Monday. She has not tested for flu and covid.    Fever    Discussed the use of AI scribe software for clinical note transcription with the patient, who gave verbal consent to proceed.  History of Present Illness   The patient presents with cough, fever, and body aches.  The symptoms began with a cough on Monday, which was the initial symptom. By Tuesday, she developed chills and extreme fatigue, describing it as feeling like 'a truck hit me.' Her temperature was 101.71F upon returning home and later increased to 102.30F. She took Tylenol Cold and Flu at 6 AM on the day of the visit.  The cough is non-productive, though it feels as if she wants to bring up mucus. The cough has been persistent since Monday and causes chest pain when she attempts to cough. No sore throat or ear pain, but she experiences sinus pressure and frequent nose blowing.  She has headaches, which she attributes to chronic neck issues, and notes that 'everything hurts.' She has taken Advil Congestion, regular Advil, and some cough medicine. She experienced a disorienting episode where she was unable to recognize her hand and thought it was her phone, which she suspects may have been related to a high fever. This has since resolved and not recurred.  She has a history of asthma that is triggered when she is sick, for which she uses an albuterol inhaler. She used the inhaler yesterday but not today. She does not have a history of chronic lung disease and does not smoke.  She works at [employer] but is not involved in direct patient care. She mentions a coworker who has been sick for several weeks, though it is unclear if the coworker tested for any illness.       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 Annabell Howells started her on flomax 12/2017--much improved as of 12/22/2017 and 03/2018 urol f/u.  Recurrence after spine surgery 02/2023   ALLERGIC RHINITIS 03/31/2010   Qualifier: Diagnosis of  By: Thurmond Butts MD, Dennard Nip     Asthma    Cervical spondylosis 2011   w/out myelopathy; nerve block injections done 08/2010 and 10/2010 (Dr. Wynn Banker)   Colon cancer screening 11/20/2019   2020 and 2024 cologuard neg. Rpt 01/2026   Essential hypertension    Fibroids, submucosal    u/s 07/2010   GAD (generalized anxiety disorder)    with panic attacks   Hyperlipemia, mixed 2022   TLC   MDD (major depressive disorder)    Menorrhagia    Endo ablation 02/14/11   Migraine    plus tension HA's; some occipital HA's associated with her cervicalgia   Plantar fasciitis    Prediabetes    a1c 5.9% Dec 2022.  Jan 2024 a1c 6.4%   Shingles    x 2.  Once L rib cage, once L ear.   Spinal cord tumor  12/2022, intradural-extramedullary-->?schwannoma vs meningioma-->Dr. Lovell Sheehan resected this->schwannoma on path   Past Surgical History:  Procedure Laterality Date   APPENDECTOMY  12/09/1977   CESAREAN SECTION  02/07/2004   ENDOMETRIAL ABLATION  02/14/2011   ENDOMETRIAL BIOPSY  12/09/2010   Benign, with exogenous hormone effect.  No hyperplasia.   FINGER SURGERY  12/09/1980   Ring finger right hand; no metal/screws in finger.   LAMINECTOMY N/A 02/26/2023   Procedure: Thoracic twelve-Lumbar one Laminectomy For Resection of Intradural Extramedullary Tumor;  Surgeon: Tressie Stalker, MD;  Location: [facility];  Service: Neurosurgery;  Laterality: N/A;    ROBOTIC ASSISTED LAPAROSCOPIC VAGINAL HYSTERECTOMY WITH FIBROID REMOVAL  02/17/2012   Benign leiomyomata and benign cervix on path   Social History   Tobacco Use   Smoking status: Never   Smokeless tobacco: Never  Vaping Use   Vaping status: Never Used  Substance Use Topics   Alcohol use: Not Currently   Drug use: No      ROS: as noted in HPI  Objective:     BP (!) 144/83   Pulse (!) 102   Temp (!) 103.1 F (39.5 C) (Oral)   Wt 163 lb 1.9 oz (74 kg)   LMP 01/24/2012   SpO2 94%   BMI 26.33 kg/m  BP Readings from Last 3 Encounters:  01/07/24 (!) 144/83  12/24/23 92/68  06/20/23 105/67   Wt Readings from Last 3 Encounters:  01/07/24 163 lb 1.9 oz (74 kg)  12/24/23 163 lb 9.6 oz (74.2 kg)  06/20/23 162 lb (73.5 kg)      Physical Exam Vitals and nursing note reviewed.  Constitutional:      General: She is not in acute distress.    Appearance: Normal appearance. She is ill-appearing. She is not toxic-appearing or diaphoretic.  HENT:     Head: Normocephalic and atraumatic.     Right Ear: Tympanic membrane, ear canal and external ear normal. No swelling or tenderness. No middle ear effusion. There is no impacted cerumen. No mastoid tenderness. No hemotympanum. Tympanic membrane is not injected, scarred, perforated or erythematous.     Left Ear: No swelling or tenderness. A middle ear effusion is present. There is no impacted cerumen. No mastoid tenderness. No hemotympanum. Tympanic membrane is not injected, scarred, perforated or erythematous.     Nose: Rhinorrhea present. Rhinorrhea is clear.     Right Turbinates: Not enlarged or swollen.     Left Turbinates: Not enlarged or swollen.     Right Sinus: No maxillary sinus tenderness or frontal sinus tenderness.     Left Sinus: No maxillary sinus tenderness or frontal sinus tenderness.     Mouth/Throat:     Lips: Pink.     Mouth: Mucous membranes are moist.     Pharynx: Oropharynx is clear. Uvula midline. Posterior  oropharyngeal erythema present. No pharyngeal swelling, oropharyngeal exudate, uvula swelling or postnasal drip.  Neck:     Comments: No nuchal rigidity Cardiovascular:     Rate and Rhythm: Regular rhythm. Tachycardia present.  Pulmonary:     Effort: Pulmonary effort is normal. No respiratory distress.     Breath sounds: Normal breath sounds. No stridor. No wheezing, rhonchi or rales.  Chest:     Chest wall: Tenderness (mid-sternal) present.  Musculoskeletal:     Cervical back: Normal range of motion and neck supple. No rigidity or tenderness.  Lymphadenopathy:     Cervical: No cervical adenopathy.  Skin:    General: Skin is warm and dry.  Coloration: Skin is not jaundiced.     Findings: No bruising, erythema or rash.  Neurological:     General: No focal deficit present.     Mental Status: She is alert and oriented to person, place, and time.      Results for orders placed or performed in visit on 01/07/24  POC COVID-19 BinaxNow  Result Value Ref Range   SARS Coronavirus 2 Ag Negative Negative  POCT Influenza A/B  Result Value Ref Range   Influenza A, POC Negative Negative   Influenza B, POC Negative Negative    Last CBC Lab Results  Component Value Date   WBC 5.3 12/24/2023   HGB 14.8 12/24/2023   HCT 44.5 12/24/2023   MCV 95.1 12/24/2023   MCH 30.8 02/28/2023   RDW 13.3 12/24/2023   PLT 264.0 12/24/2023   Last metabolic panel Lab Results  Component Value Date   GLUCOSE 127 (H) 12/24/2023   NA 142 12/24/2023   K 5.1 12/24/2023   CL 106 12/24/2023   CO2 29 12/24/2023   BUN 22 12/24/2023   CREATININE 0.87 12/24/2023   GFR 74.51 12/24/2023   CALCIUM 9.5 12/24/2023   PROT 6.6 12/24/2023   ALBUMIN 4.6 12/24/2023   LABGLOB 2.1 06/03/2018   AGRATIO 2.1 06/03/2018   BILITOT 0.9 12/24/2023   ALKPHOS 75 12/24/2023   AST 18 12/24/2023   ALT 18 12/24/2023   ANIONGAP 8 02/28/2023      The 10-year ASCVD risk score (Arnett DK, et al., 2019) is:  2.4%  Assessment & Plan:  Fever, unspecified fever cause -     POC COVID-19 BinaxNow -     POCT Influenza A/B -     DG Chest 2 View; Future -     Respiratory virus panel  Generalized body aches -     POC COVID-19 BinaxNow -     POCT Influenza A/B -     Respiratory virus panel  Acute cough -     POC COVID-19 BinaxNow -     POCT Influenza A/B -     DG Chest 2 View; Future -     Respiratory virus panel   Assessment and Plan    Influenza-like Illness Acute onset of cough, fever, chills, body aches, and fatigue. No significant sinus tenderness or neck stiffness. Lungs clear on auscultation. Patient has been self-medicating with Tylenol, Advil, and cough medicine. Possible exposure to a sick coworker. Swabs for COVID and flu negative. -Await results of viral respiratory panel. -Obtain STAT CXR given O2 94%, r/o CAP -Continue supportive care with over-the-counter medications as tolerated.  Headache Chronic, possibly related to neck issues. No significant change from baseline. -Continue current management strategies.    No follow-ups on file.   Maretta Bees, PA

## 2024-01-07 NOTE — Telephone Encounter (Signed)
No further action needed.

## 2024-01-07 NOTE — Patient Instructions (Signed)
Your covid and flu test are negative. We will send out an additional viral upper respiratory swab.  Please head to [facility] to obtain a Stat chest xray. We will notify you once radiology results received.  In the meantime, please take 800mg  ibuprofen alternating with 1000mg  tylenol every 4 hours. This will help with body aches, headache and help break the fever.  Please maintain hydration - your fluid needs increase dramatically when you have a fever. Please drink WATER!  Purchase OTC Oscillococcinum. This helps with body aches. Consider OTC Quercetin 500mg  with zinc 50mg  -this boosts your bodies own natural immunity.

## 2024-01-07 NOTE — Telephone Encounter (Signed)
Copied from CRM [id number]. Topic: Clinical - Red Word Triage >> Jan 07, 2024  8:40 AM Shelbie Proctor wrote: Red Word that prompted transfer to Nurse Triage: Patient [phone] asking to be seen today to test for the flu. Patient has a fever 101, weak, dizziness, congestion, coughing, body aches and pain. Patient denies shortness of breath because she's using her inhaler.  Chief Complaint: Flu symptoms Symptoms: Fever, cough, body aches, chills, sore chest Frequency: Yesterday Pertinent Negatives: Patient denies SOB Disposition: [] ED /[] Urgent Care (no appt availability in office) / [x] Appointment(In office/virtual)/ []  [place] [facility]/ [] Home Care/ [] Refused Recommended Disposition /[] [facility] [place] [facility]/ []  Follow-up with PCP Additional Notes: Patient called in to report a variety of cold/flu symptoms. Patient reported fever, moderate/severe chills, body aches, productive cough and a sore chest due to cough. Patient's most recent temperature was 101.0, taken this morning. Patient stated she has been taking Advil and Tylenol and denied relief. Patient denied SOB, but stated that she is using her inhaler to manage her symptoms. Patient stated that she is having to use a robe, blankets, and a fireplace to keep warm. Advised patient to be seen in the office. No availability with PCP. Scheduled patient with another provider in the office today. Advised patient to call back if symptoms worsen. Patient complied.   Reason for Disposition  Severe chills (i.e., feeling extremely cold WITH shaking chills)  Answer Assessment - Initial Assessment Questions 1. TEMPERATURE: "What is the most recent temperature?"  "How was it measured?"      101.0 taken at 6 am this morning orally, states that Tylenol has not helped to bring the fever down 2. ONSET: "When did the fever start?"      Yesterday evening 3. CHILLS: "Do you have chills?" If yes: "How bad are they?"  (e.g., none, mild, moderate, severe)   -  NONE: no chills   - MILD: feeling cold   - MODERATE: feeling very cold, some shivering (feels better under a thick blanket)   - SEVERE: feeling extremely cold with shaking chills (general body shaking, rigors; even under a thick blanket)      Moderate/severe- using fireplace, blanket, and robe to keep warm 4. OTHER SYMPTOMS: "Do you have any other symptoms besides the fever?"  (e.g., abdomen pain, cough, diarrhea, earache, headache, sore throat, urination pain)     Chills, body aches, productive cough, sore chest due to the cough, denies SOB- states inhaler helps prevent wheezing 5. CAUSE: If there are no symptoms, ask: "What do you think is causing the fever?"      Flu or Covid 6. CONTACTS: "Does anyone else in the family have an infection?"     States coworker has been sick 7. TREATMENT: "What have you done so far to treat this fever?" (e.g., medications)     Advil and Tylenol severe cold and flu 8. IMMUNOCOMPROMISE: "Do you have of the following: diabetes, HIV positive, splenectomy, cancer chemotherapy, chronic steroid treatment, transplant patient, etc."     Denies  Protocols used: Fever-A-AH

## 2024-01-08 ENCOUNTER — Other Ambulatory Visit (HOSPITAL_COMMUNITY): Payer: Self-pay

## 2024-01-08 ENCOUNTER — Ambulatory Visit: Payer: 59

## 2024-01-08 ENCOUNTER — Other Ambulatory Visit: Payer: Self-pay | Admitting: Urgent Care

## 2024-01-08 DIAGNOSIS — J22 Unspecified acute lower respiratory infection: Secondary | ICD-10-CM

## 2024-01-08 MED ORDER — AZITHROMYCIN 250 MG PO TABS
ORAL_TABLET | ORAL | 0 refills | Status: AC
  Filled 2024-01-08: qty 6, 5d supply, fill #0

## 2024-01-08 MED ORDER — LISINOPRIL 20 MG PO TABS
20.0000 mg | ORAL_TABLET | Freq: Every day | ORAL | 1 refills | Status: DC
  Filled 2024-01-08: qty 90, 90d supply, fill #0
  Filled 2024-03-25: qty 90, 90d supply, fill #1

## 2024-01-10 ENCOUNTER — Encounter: Payer: Self-pay | Admitting: Urgent Care

## 2024-01-10 LAB — RESPIRATORY VIRUS PANEL
Adenovirus B: NOT DETECTED
HUMAN PARAINFLU VIRUS 1: NOT DETECTED
HUMAN PARAINFLU VIRUS 2: NOT DETECTED
HUMAN PARAINFLU VIRUS 3: NOT DETECTED
INFLUENZA A SUBTYPE H1: DETECTED — AB
INFLUENZA A SUBTYPE H3: NOT DETECTED
Influenza A: DETECTED — AB
Influenza B: NOT DETECTED
Metapneumovirus: NOT DETECTED
Respiratory Syncytial Virus A: NOT DETECTED
Respiratory Syncytial Virus B: NOT DETECTED
Rhinovirus: NOT DETECTED

## 2024-01-12 ENCOUNTER — Other Ambulatory Visit: Payer: Self-pay | Admitting: Urgent Care

## 2024-01-12 ENCOUNTER — Other Ambulatory Visit (HOSPITAL_COMMUNITY): Payer: Self-pay

## 2024-01-12 DIAGNOSIS — J101 Influenza due to other identified influenza virus with other respiratory manifestations: Secondary | ICD-10-CM

## 2024-01-12 MED ORDER — HYDROCODONE BIT-HOMATROP MBR 5-1.5 MG/5ML PO SOLN
5.0000 mL | Freq: Every evening | ORAL | 0 refills | Status: DC | PRN
  Filled 2024-01-12: qty 60, 12d supply, fill #0

## 2024-01-20 ENCOUNTER — Ambulatory Visit
Admission: RE | Admit: 2024-01-20 | Discharge: 2024-01-20 | Disposition: A | Discharge status: Home / Self Care | Payer: 59 | Source: Ambulatory Visit | Attending: Family Medicine | Admitting: Family Medicine

## 2024-01-20 DIAGNOSIS — Z1231 Encounter for screening mammogram for malignant neoplasm of breast: Secondary | ICD-10-CM

## 2024-02-03 ENCOUNTER — Encounter: Payer: 59 | Attending: Family Medicine | Admitting: Skilled Nursing Facility1

## 2024-02-03 ENCOUNTER — Encounter: Payer: Self-pay | Admitting: Skilled Nursing Facility1

## 2024-02-03 DIAGNOSIS — E631 Imbalance of constituents of food intake: Secondary | ICD-10-CM | POA: Insufficient documentation

## 2024-02-03 NOTE — Progress Notes (Unsigned)
 Other Dx: Hyperlipidemia HTN GAD   DM medications: Metformin   A1C 6.6  Pt states she does not regularly check her blood sugar. Pt state she is still working on trying to be more active since a tumor removal last year.

## 2024-02-18 ENCOUNTER — Ambulatory Visit: Payer: 59 | Admitting: Family Medicine

## 2024-02-20 NOTE — Patient Instructions (Signed)

## 2024-02-25 ENCOUNTER — Ambulatory Visit: Payer: 59 | Admitting: Family Medicine

## 2024-02-25 ENCOUNTER — Other Ambulatory Visit (HOSPITAL_COMMUNITY): Payer: Self-pay

## 2024-02-25 ENCOUNTER — Encounter: Payer: Self-pay | Admitting: Family Medicine

## 2024-02-25 VITALS — BP 124/75 | HR 67 | Temp 99.0°F | Wt 159.4 lb

## 2024-02-25 DIAGNOSIS — E119 Type 2 diabetes mellitus without complications: Secondary | ICD-10-CM | POA: Diagnosis not present

## 2024-02-25 DIAGNOSIS — Z7984 Long term (current) use of oral hypoglycemic drugs: Secondary | ICD-10-CM | POA: Diagnosis not present

## 2024-02-25 LAB — MICROALBUMIN / CREATININE URINE RATIO
Creatinine,U: 31.2 mg/dL
Microalb Creat Ratio: UNDETERMINED mg/g (ref 0.0–30.0)
Microalb, Ur: 0 mg/dL (ref 0.0–1.9)

## 2024-02-25 MED ORDER — METFORMIN HCL ER 500 MG PO TB24
500.0000 mg | ORAL_TABLET | Freq: Two times a day (BID) | ORAL | 3 refills | Status: DC
  Filled 2024-02-25: qty 60, 30d supply, fill #0
  Filled 2024-03-25: qty 60, 30d supply, fill #1
  Filled 2024-04-27: qty 60, 30d supply, fill #2
  Filled 2024-05-20 (×3): qty 60, 30d supply, fill #3
  Filled ????-??-??: fill #3

## 2024-02-25 NOTE — Progress Notes (Signed)
 OFFICE VISIT  02/25/2024  CC: No chief complaint on file.   Patient is a 58 y.o. female who presents for 2 mo f/u recent dx DM. A/P as of last visit on 12/24/23: "1 essential hypertension, doing well on lisinopril 20 mg a day. Check electrolytes and creatinine today.   2.  Prediabetes.  Doing great with TLC. Hba1c and fasting glucose today.   3.  Nocturnal leg cramps.  Onset after 02/2023 lumbar discectomy. Occ use of flexeril is helpful.  Rx for #30 today."  INTERIM HX: Last visit her hemoglobin A1c had risen to 6.6% and I started her on metformin 500 mg twice daily. She saw dietician and she has made some dietary changes.  She is trying to get more exercise. The metformin has caused a moderate amount of GI upset but she has continued to take it.   Past Medical History:  Diagnosis Date   Acute urinary retention 2018   Once after hysterectomy surgery 2012, another spontaneous occurrence 11/2017--Dr. Annabell Howells started her on flomax 12/2017--much improved as of 12/22/2017 and 03/2018 urol f/u.  Recurrence after spine surgery 02/2023   ALLERGIC RHINITIS 03/31/2010   Qualifier: Diagnosis of  By: Thurmond Butts MD, Dennard Nip     Asthma    Cervical spondylosis 2011   w/out myelopathy; nerve block injections done 08/2010 and 10/2010 (Dr. Wynn Banker)   Colon cancer screening 11/20/2019   2020 and 2024 cologuard neg. Rpt 01/2026   Diabetes mellitus without complication (HCC)    Essential hypertension    Fibroids, submucosal    u/s 07/2010   GAD (generalized anxiety disorder)    with panic attacks   Hyperlipemia, mixed 2022   TLC   MDD (major depressive disorder)    Menorrhagia    Endo ablation 02/14/11   Migraine    plus tension HA's; some occipital HA's associated with her cervicalgia   Plantar fasciitis    Prediabetes    a1c 5.9% Dec 2022.  Jan 2024 a1c 6.4%   Shingles    x 2.  Once L rib cage, once L ear.   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  Lovell Sheehan resected this->schwannoma on path    Past Surgical History:  Procedure Laterality Date   APPENDECTOMY  12/09/1977   CESAREAN SECTION  02/07/2004   ENDOMETRIAL ABLATION  02/14/2011   ENDOMETRIAL BIOPSY  12/09/2010   Benign, with exogenous hormone effect.  No hyperplasia.   FINGER SURGERY  12/09/1980   Ring finger right hand; no metal/screws in finger.   LAMINECTOMY N/A 02/26/2023   Procedure: Thoracic twelve-Lumbar one Laminectomy For Resection of Intradural Extramedullary Tumor;  Surgeon: Tressie Stalker, MD;  Location: [facility] OR;  Service: Neurosurgery;  Laterality: N/A;   ROBOTIC ASSISTED LAPAROSCOPIC VAGINAL HYSTERECTOMY WITH FIBROID REMOVAL  02/17/2012   Benign leiomyomata and benign cervix on path    Outpatient Medications Prior to Visit  Medication Sig Dispense Refill   albuterol (VENTOLIN HFA) 108 (90 Base) MCG/ACT inhaler Inhale 1-2 puffs into the lungs every 4 (four) hours as needed for wheezing or shortness of breath. 6.7 g 0   Aspirin-Acetaminophen-Caffeine (EXCEDRIN PO) Take 2 tablets by mouth in the morning.     cetirizine (ZYRTEC) 10 MG tablet Take 10 mg by mouth daily.     Cholecalciferol (VITAMIN D3 PO) Take 1 tablet by mouth daily.     Cyanocobalamin (B-12 PO) Take 1 tablet by mouth daily.     cyclobenzaprine (FLEXERIL) 10 MG tablet Take 1 tablet (10 mg  total) by mouth at bedtime as needed for muscle cramps. 30 tablet 0   fluticasone (FLONASE) 50 MCG/ACT nasal spray Place 2 sprays into both nostrils daily. 16 g 0   lisinopril (ZESTRIL) 20 MG tablet Take 1 tablet (20 mg total) by mouth daily. 90 tablet 1   Menthol, Topical Analgesic, (BIOFREEZE EX) Apply 1 Application topically daily as needed (pain).     Multiple Vitamin (MULTIVITAMIN WITH MINERALS) TABS tablet Take 1 tablet by mouth daily.     tamsulosin (FLOMAX) 0.4 MG CAPS capsule Take 1 capsule (0.4 mg total) by mouth daily. 30 capsule 1   metFORMIN (GLUCOPHAGE) 500 MG tablet Take 1 tablet (500 mg total) by  mouth 2 (two) times daily with a meal. 180 tablet 3   HYDROcodone bit-homatropine (HYCODAN) 5-1.5 MG/5ML syrup Take 5 mLs by mouth at bedtime as needed for cough. 60 mL 0   No facility-administered medications prior to visit.    No Known Allergies  Review of Systems As per HPI  PE:    02/25/2024    1:32 PM 02/03/2024    2:01 PM 01/07/2024   11:19 AM  Vitals with BMI  Weight 159 lbs 6 oz -- 163 lbs 2 oz  BMI   26.34  Systolic 124  144  Diastolic 75  83  Pulse 67  102     Physical Exam  Gen: Alert, well appearing.  Patient is oriented to person, place, time, and situation. AFFECT: pleasant, lucid thought and speech. No further exam today  LABS:  Last CBC Lab Results  Component Value Date   WBC 5.3 12/24/2023   HGB 14.8 12/24/2023   HCT 44.5 12/24/2023   MCV 95.1 12/24/2023   MCH 30.8 02/28/2023   RDW 13.3 12/24/2023   PLT 264.0 12/24/2023   Last metabolic panel Lab Results  Component Value Date   GLUCOSE 127 (H) 12/24/2023   NA 142 12/24/2023   K 5.1 12/24/2023   CL 106 12/24/2023   CO2 29 12/24/2023   BUN 22 12/24/2023   CREATININE 0.87 12/24/2023   GFR 74.51 12/24/2023   CALCIUM 9.5 12/24/2023   PROT 6.6 12/24/2023   ALBUMIN 4.6 12/24/2023   LABGLOB 2.1 06/03/2018   AGRATIO 2.1 06/03/2018   BILITOT 0.9 12/24/2023   ALKPHOS 75 12/24/2023   AST 18 12/24/2023   ALT 18 12/24/2023   ANIONGAP 8 02/28/2023   Last lipids Lab Results  Component Value Date   CHOL 196 12/24/2023   HDL 63.00 12/24/2023   LDLCALC 108 (H) 12/24/2023   LDLDIRECT 119.0 11/16/2021   TRIG 125.0 12/24/2023   CHOLHDL 3 12/24/2023   Last hemoglobin A1c Lab Results  Component Value Date   HGBA1C 6.6 (H) 12/24/2023   Last thyroid functions Lab Results  Component Value Date   TSH 1.53 12/24/2023   IMPRESSION AND PLAN:  New diagnosis diabetes. Not tolerating metformin all that well. Will change her to the extended release formulation of metformin and have her continue the  500 mg dose twice a day. Her hemoglobin A1c was done today but this was done too early (it has only been about 2 months since last A1c check).  Result was 5.5%.  This is likely falsely low.  Urine microalbumin/creatinine today. We discussed the need for annual eye exam to screen for diabetic retinopathy and annual complete feet exam as well. Discussed every 3 month hemoglobin A1c.  An After Visit Summary was printed and given to the patient.  FOLLOW UP: Return in  about 4 months (around 06/26/2024) for routine chronic illness f/u.  Signed:  Santiago Bumpers, MD           02/25/2024

## 2024-02-26 ENCOUNTER — Encounter: Payer: Self-pay | Admitting: Family Medicine

## 2024-03-18 ENCOUNTER — Encounter: Payer: Self-pay | Admitting: Skilled Nursing Facility1

## 2024-03-18 ENCOUNTER — Encounter: Payer: 59 | Attending: Family Medicine | Admitting: Skilled Nursing Facility1

## 2024-03-18 DIAGNOSIS — E631 Imbalance of constituents of food intake: Secondary | ICD-10-CM | POA: Insufficient documentation

## 2024-03-18 NOTE — Progress Notes (Signed)
 Other Dx: Hyperlipidemia HTN GAD   DM medications: Metformin   A1C 6.6 (pt stated 5.7)  Pt arrives having made a significant amount of changes and eating vegetables!  Pt states she has been eating a lot of cauliflower and couple salads and has been eating beans.   Diabetes Self-Management Education  Visit Type: Follow-up  Appt. Start Time: 1:51 Appt. End Time: 2:30  03/22/2024  [name], identified by name and date of birth, is a 58 y.o. female with a diagnosis of Diabetes: Type 2.   ASSESSMENT  Last menstrual period 01/24/2012. There is no height or weight on file to calculate BMI.   Diabetes Self-Management Education - 03/18/24 1401       Visit Information   Visit Type Follow-up      Initial Visit   Diabetes Type Type 2    Are you currently following a meal plan? No    Are you taking your medications as prescribed? Yes      Health Coping   How would you rate your overall health? Good             Individualized Plan for Diabetes Self-Management Training:   Learning Objective:  Patient will have a greater understanding of diabetes self-management. Patient education plan is to attend individual and/or group sessions per assessed needs and concerns.    Expected Outcomes:     Education material provided: No sodium seasonings  If problems or questions, patient to contact team via:  Phone and Email  Future DSME appointment:

## 2024-03-19 ENCOUNTER — Telehealth: Payer: Self-pay

## 2024-03-19 NOTE — Telephone Encounter (Signed)
 Copied from CRM [id number]. Topic: General - Other >> Mar 19, 2024  3:34 PM [place] A wrote: Reason for CRM: Patient called said that insurance will not cover her Flu testing because wrong code was used   Please assist with coding

## 2024-03-22 NOTE — Telephone Encounter (Signed)
 Please advise if changes needed from previous appt

## 2024-03-23 NOTE — Telephone Encounter (Signed)
 So the CPT code is the code for the viral respiratory panel that we sent out to the lab. I put it under the correct ICD-10 codes. Some insurances will only pay for that for "Beneficiaries with serious or critical illness or at imminent risk of becoming seriously or critically ill, immunodeficiency, and/or severe underlying condition contributory to testing using an expanded syndromic panel."  I did this test for her due to her history of asthma and concern for worsening symptoms. I'm willing to fill out medical necessity forms if needed, but the coding used was correct.

## 2024-03-23 NOTE — Telephone Encounter (Signed)
 Spoke with pt and the cpt code 40981 was given to pt stating experimental and trial testing. Unsure what else can be done

## 2024-03-23 NOTE — Telephone Encounter (Signed)
 Pt advised of provider instructions.

## 2024-03-23 NOTE — Telephone Encounter (Signed)
 Im confused - the code used was generalized body aches and fever. It was an in office test. Is she by chance referring to the respiratory virus panel sent to the lab?

## 2024-03-25 ENCOUNTER — Encounter: Payer: Self-pay | Admitting: Urgent Care

## 2024-03-25 NOTE — Telephone Encounter (Signed)
 Reason for CRM: patient is needing to get a medical necessity due to a previous visit being coded incorrectly to her insurance. Please call back as soon as possible #[phone]

## 2024-03-25 NOTE — Telephone Encounter (Signed)
 What diagnosis covers this test?!?

## 2024-03-25 NOTE — Telephone Encounter (Signed)
  Sara Burton from [employer] is calling regarding this matter, I advised that Sara Burton is willing to fill out a medical necessity for,; however, she was explaining that the reason it is being denied is because of the diagnosis. She states that we can update it and it should be approved or submit the medical necessity form to try an appeal the denial. Best call back number [phone].

## 2024-03-29 ENCOUNTER — Encounter: Payer: Self-pay | Admitting: Urgent Care

## 2024-04-01 NOTE — Telephone Encounter (Addendum)
 Forms placed in PCP office that was received via email and mailed bill from [employer].

## 2024-04-02 NOTE — Telephone Encounter (Signed)
 Forms completed and given back. Pt will need to sign and complete claim ID info prior to faxing. Thank you

## 2024-04-05 NOTE — Telephone Encounter (Signed)
 Spoke with pt and will send via mychart. Pt states she just got a claim number and sent appeal to insurance.

## 2024-04-06 NOTE — Telephone Encounter (Signed)
 Spoke with patient regarding results/recommendations.

## 2024-05-04 ENCOUNTER — Other Ambulatory Visit: Payer: Self-pay

## 2024-05-05 ENCOUNTER — Encounter: Payer: Self-pay | Admitting: Urgent Care

## 2024-05-05 DIAGNOSIS — J0100 Acute maxillary sinusitis, unspecified: Secondary | ICD-10-CM

## 2024-05-06 MED ORDER — AMOXICILLIN-POT CLAVULANATE 875-125 MG PO TABS: 1.0000 | ORAL_TABLET | Freq: Two times a day (BID) | ORAL | 0 refills | Status: AC

## 2024-05-06 MED ORDER — PREDNISONE 50 MG PO TABS: 50.0000 mg | ORAL_TABLET | Freq: Every day | ORAL | 0 refills | Status: DC

## 2024-05-06 NOTE — Telephone Encounter (Signed)
 No further action needed at this time.

## 2024-05-20 ENCOUNTER — Other Ambulatory Visit (HOSPITAL_COMMUNITY): Payer: Self-pay

## 2024-05-21 ENCOUNTER — Other Ambulatory Visit (HOSPITAL_COMMUNITY): Payer: Self-pay

## 2024-06-21 ENCOUNTER — Other Ambulatory Visit (HOSPITAL_COMMUNITY): Payer: Self-pay

## 2024-06-21 ENCOUNTER — Encounter: Payer: Self-pay | Admitting: Family Medicine

## 2024-06-21 ENCOUNTER — Ambulatory Visit: Admitting: Family Medicine

## 2024-06-21 VITALS — BP 111/72 | HR 66 | Temp 98.3°F | Ht 66.0 in | Wt 155.8 lb

## 2024-06-21 DIAGNOSIS — E119 Type 2 diabetes mellitus without complications: Secondary | ICD-10-CM

## 2024-06-21 DIAGNOSIS — R252 Cramp and spasm: Secondary | ICD-10-CM

## 2024-06-21 DIAGNOSIS — Z7984 Long term (current) use of oral hypoglycemic drugs: Secondary | ICD-10-CM | POA: Diagnosis not present

## 2024-06-21 DIAGNOSIS — I10 Essential (primary) hypertension: Secondary | ICD-10-CM | POA: Diagnosis not present

## 2024-06-21 LAB — POCT GLYCOSYLATED HEMOGLOBIN (HGB A1C)
HbA1c POC (<> result, manual entry): 5.4 % (ref 4.0–5.6)
HbA1c, POC (controlled diabetic range): 5.4 % (ref 0.0–7.0)
HbA1c, POC (prediabetic range): 5.4 % — AB (ref 5.7–6.4)
Hemoglobin A1C: 5.4 % (ref 4.0–5.6)

## 2024-06-21 LAB — BASIC METABOLIC PANEL WITH GFR
BUN: 15 mg/dL (ref 6–23)
CO2: 27 meq/L (ref 19–32)
Calcium: 9.1 mg/dL (ref 8.4–10.5)
Chloride: 104 meq/L (ref 96–112)
Creatinine, Ser: 0.8 mg/dL (ref 0.40–1.20)
GFR: 82.12 mL/min (ref >60.00)
Glucose, Bld: 110 mg/dL — ABNORMAL HIGH (ref 70–99)
Potassium: 3.9 meq/L (ref 3.5–5.1)
Sodium: 139 meq/L (ref 135–145)

## 2024-06-21 MED ORDER — LISINOPRIL 20 MG PO TABS
20.0000 mg | ORAL_TABLET | Freq: Every day | ORAL | 3 refills | Status: AC
  Filled 2024-06-21: qty 90, 90d supply, fill #0
  Filled 2024-10-01: qty 90, 90d supply, fill #1
  Filled 2025-01-02: qty 90, 90d supply, fill #2

## 2024-06-21 MED ORDER — CYCLOBENZAPRINE HCL 10 MG PO TABS
10.0000 mg | ORAL_TABLET | Freq: Every evening | ORAL | 2 refills | Status: AC | PRN
  Filled 2024-06-21: qty 30, 30d supply, fill #0
  Filled 2024-09-20: qty 30, 30d supply, fill #1
  Filled 2025-01-02: qty 30, 30d supply, fill #2

## 2024-06-21 MED ORDER — METFORMIN HCL ER 500 MG PO TB24
500.0000 mg | ORAL_TABLET | Freq: Two times a day (BID) | ORAL | 3 refills | Status: AC
  Filled 2024-06-21: qty 180, 90d supply, fill #0
  Filled 2024-09-20: qty 180, 90d supply, fill #1
  Filled 2025-01-02: qty 180, 90d supply, fill #2

## 2024-06-21 NOTE — Progress Notes (Signed)
 OFFICE VISIT  06/21/2024  CC:  Chief Complaint  Patient presents with   Medical Management of Chronic Issues    Pt is fasting    Patient is a 58 y.o. female who presents for 40-month follow-up diabetes and hypertension. A/P as of last visit: New diagnosis diabetes. Not tolerating metformin  all that well. Will change her to the extended release formulation of metformin  and have her continue the 500 mg dose twice a day. Her hemoglobin A1c was done today but this was done too early (it has only been about 2 months since last A1c check).  Result was 5.5%.  This is likely falsely low.   Urine microalbumin/creatinine today. We discussed the need for annual eye exam to screen for diabetic retinopathy and annual complete feet exam as well. Discussed every 3 month hemoglobin A1c.  INTERIM HX: Feeling well.  Still having some nocturnal muscle cramps, uses Flexeril  sparingly.  No home glucose or blood pressure monitoring but she is doing great with diet and exercise. No significant problems taking metformin  XR.  ROS as above, plus--> no fevers, no CP, no SOB, no wheezing, no cough, no dizziness, no HAs, no rashes, no melena/hematochezia.  No polyuria or polydipsia.  No myalgias or arthralgias.  No focal weakness, paresthesias, or tremors.  No acute vision or hearing abnormalities.  No dysuria or unusual/new urinary urgency or frequency.  No recent changes in lower legs. No n/v/d or abd pain.  No palpitations.     Past Medical History:  Diagnosis Date   Acute urinary retention 2018   Once after hysterectomy surgery 2012, another spontaneous occurrence 11/2017--Dr. Watt started her on flomax  12/2017--much improved as of 12/22/2017 and 03/2018 urol f/u.  Recurrence after spine surgery 02/2023   ALLERGIC RHINITIS 03/31/2010   Qualifier: Diagnosis of  By: Desiderio MD, [place]     Asthma    Cervical spondylosis 2011   w/out myelopathy; nerve block injections done 08/2010 and 10/2010 (Dr. Carilyn)    Colon cancer screening 11/20/2019   2020 and 2024 cologuard neg. Rpt 01/2026   Diabetes mellitus without complication (HCC)    Essential hypertension    Fibroids, submucosal    u/s 07/2010   GAD (generalized anxiety disorder)    with panic attacks   Hyperlipemia, mixed 2022   TLC   MDD (major depressive disorder)    Menorrhagia    Endo ablation 02/14/11   Migraine    plus tension HA's; some occipital HA's associated with her cervicalgia   Plantar fasciitis    Prediabetes    a1c 5.9% Dec 2022.  Jan 2024 a1c 6.4%   Shingles    x 2.  Once L rib cage, once L ear.   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 Mavis resected this->schwannoma on path    Past Surgical History:  Procedure Laterality Date   APPENDECTOMY  12/09/1977   CESAREAN SECTION  02/07/2004   ENDOMETRIAL ABLATION  02/14/2011   ENDOMETRIAL BIOPSY  12/09/2010   Benign, with exogenous hormone effect.  No hyperplasia.   FINGER SURGERY  12/09/1980   Ring finger right hand; no metal/screws in finger.   LAMINECTOMY N/A 02/26/2023   Procedure: Thoracic twelve-Lumbar one Laminectomy For Resection of Intradural Extramedullary Tumor;  Surgeon: Mavis Purchase, MD;  Location: [facility] OR;  Service: Neurosurgery;  Laterality: N/A;   ROBOTIC ASSISTED LAPAROSCOPIC VAGINAL HYSTERECTOMY WITH FIBROID REMOVAL  02/17/2012   Benign leiomyomata and benign cervix on path    Outpatient Medications Prior to  Visit  Medication Sig Dispense Refill   Aspirin-Acetaminophen -Caffeine (EXCEDRIN PO) Take 2 tablets by mouth in the morning.     cetirizine (ZYRTEC) 10 MG tablet Take 10 mg by mouth daily.     Cholecalciferol (VITAMIN D3 PO) Take 1 tablet by mouth daily.     Cyanocobalamin (B-12 PO) Take 1 tablet by mouth daily.     Menthol , Topical Analgesic, (BIOFREEZE EX) Apply 1 Application topically daily as needed (pain).     Multiple Vitamin (MULTIVITAMIN WITH MINERALS) TABS tablet Take 1 tablet by  mouth daily.     cyclobenzaprine  (FLEXERIL ) 10 MG tablet Take 1 tablet (10 mg total) by mouth at bedtime as needed for muscle cramps. 30 tablet 0   albuterol  (VENTOLIN  HFA) 108 (90 Base) MCG/ACT inhaler Inhale 1-2 puffs into the lungs every 4 (four) hours as needed for wheezing or shortness of breath. (Patient not taking: Reported on 06/21/2024) 6.7 g 0   fluticasone  (FLONASE ) 50 MCG/ACT nasal spray Place 2 sprays into both nostrils daily. (Patient not taking: Reported on 06/21/2024) 16 g 0   tamsulosin  (FLOMAX ) 0.4 MG CAPS capsule Take 1 capsule (0.4 mg total) by mouth daily. (Patient not taking: Reported on 06/21/2024) 30 capsule 1   lisinopril  (ZESTRIL ) 20 MG tablet Take 1 tablet (20 mg total) by mouth daily. 90 tablet 1   metFORMIN  (GLUCOPHAGE -XR) 500 MG 24 hr tablet Take 1 tablet (500 mg total) by mouth 2 (two) times daily with a meal. 60 tablet 3   predniSONE  (DELTASONE ) 50 MG tablet Take 1 tablet (50 mg total) by mouth daily with breakfast. 5 tablet 0   No facility-administered medications prior to visit.    No Known Allergies  Review of Systems As per HPI  PE:    06/21/2024    9:02 AM 03/18/2024    1:57 PM 02/25/2024    1:32 PM  Vitals with BMI  Height 5' 6    Weight 155 lbs 13 oz -- 159 lbs 6 oz  BMI 25.16    Systolic 111  124  Diastolic 72  75  Pulse 66  67     Physical Exam  Gen: Alert, well appearing.  Patient is oriented to person, place, time, and situation. AFFECT: pleasant, lucid thought and speech. CV: RRR, no m/r/g.   LUNGS: CTA bilat, nonlabored resps, good aeration in all lung fields. EXT: no clubbing or cyanosis.  no edema.    LABS:  Last CBC Lab Results  Component Value Date   WBC 5.3 12/24/2023   HGB 14.8 12/24/2023   HCT 44.5 12/24/2023   MCV 95.1 12/24/2023   MCH 30.8 02/28/2023   RDW 13.3 12/24/2023   PLT 264.0 12/24/2023   Last metabolic panel Lab Results  Component Value Date   GLUCOSE 127 (H) 12/24/2023   NA 142 12/24/2023   K 5.1  12/24/2023   CL 106 12/24/2023   CO2 29 12/24/2023   BUN 22 12/24/2023   CREATININE 0.87 12/24/2023   GFR 74.51 12/24/2023   CALCIUM 9.5 12/24/2023   PROT 6.6 12/24/2023   ALBUMIN 4.6 12/24/2023   LABGLOB 2.1 06/03/2018   AGRATIO 2.1 06/03/2018   BILITOT 0.9 12/24/2023   ALKPHOS 75 12/24/2023   AST 18 12/24/2023   ALT 18 12/24/2023   ANIONGAP 8 02/28/2023   Last lipids Lab Results  Component Value Date   CHOL 196 12/24/2023   HDL 63.00 12/24/2023   LDLCALC 108 (H) 12/24/2023   LDLDIRECT 119.0 11/16/2021   TRIG 125.0  12/24/2023   CHOLHDL 3 12/24/2023   Last hemoglobin A1c Lab Results  Component Value Date   HGBA1C 5.4 06/21/2024   HGBA1C 5.4 06/21/2024   HGBA1C 5.4 (A) 06/21/2024   HGBA1C 5.4 06/21/2024   Last thyroid  functions Lab Results  Component Value Date   TSH 1.53 12/24/2023   IMPRESSION AND PLAN:  #1 recent diagnosis diabetes. Tolerating metformin  XR 500 mg twice daily. Hemoglobin A1c significantly improved today 5.4%, down from 6.6% about 6 months ago. Continue metformin  XR 500 mg twice daily.  2.  Hypertension, well-controlled on lisinopril  20 mg a day. Electrolytes and creatinine monitoring today. 3.  Nocturnal leg cramps. She does well with as needed occasional use of cyclobenzaprine  10 mg.  #30, refill x 2 today.  An After Visit Summary was printed and given to the patient.  FOLLOW UP: Return in about 3 months (around 09/21/2024) for routine chronic illness f/u. Next CPE January 2026 Signed:  Gerlene Hockey, MD           06/21/2024

## 2024-06-22 ENCOUNTER — Ambulatory Visit: Payer: Self-pay | Admitting: Family Medicine

## 2024-06-23 ENCOUNTER — Ambulatory Visit: Payer: 59 | Admitting: Family Medicine

## 2024-06-29 DIAGNOSIS — D497 Neoplasm of unspecified behavior of endocrine glands and other parts of nervous system: Secondary | ICD-10-CM | POA: Diagnosis not present

## 2024-06-29 DIAGNOSIS — D492 Neoplasm of unspecified behavior of bone, soft tissue, and skin: Secondary | ICD-10-CM | POA: Diagnosis not present

## 2024-08-28 ENCOUNTER — Encounter: Payer: Self-pay | Admitting: Urgent Care

## 2024-08-28 ENCOUNTER — Telehealth: Admitting: Nurse Practitioner

## 2024-08-28 ENCOUNTER — Encounter: Payer: Self-pay | Admitting: Family Medicine

## 2024-08-28 DIAGNOSIS — R399 Unspecified symptoms and signs involving the genitourinary system: Secondary | ICD-10-CM

## 2024-08-28 MED ORDER — NITROFURANTOIN MONOHYD MACRO 100 MG PO CAPS: 100.0000 mg | ORAL_CAPSULE | Freq: Two times a day (BID) | ORAL | 0 refills | Status: AC

## 2024-08-28 NOTE — Progress Notes (Signed)
 I have spent 5 minutes in review of e-visit questionnaire, review and updating patient chart, medical decision making and response to patient.   Claiborne Rigg, NP

## 2024-08-28 NOTE — Progress Notes (Signed)

## 2024-08-30 NOTE — Telephone Encounter (Signed)
Forwarding to Dr. Anitra Lauth

## 2024-08-31 NOTE — Telephone Encounter (Signed)
 On 08/28/2024 she got an e-visit and was diagnosed with UTI and was prescribed Macrobid .  Please confirm with patient that she has been taking this antibiotic. If she has been taking it then I will switch her to a different antibiotic but I would like her to bring in a urine sample so we can do a culture.

## 2024-09-01 NOTE — Telephone Encounter (Signed)
 Noted

## 2024-09-27 ENCOUNTER — Ambulatory Visit: Admitting: Family Medicine

## 2024-09-27 NOTE — Progress Notes (Deleted)
 OFFICE VISIT  09/27/2024  CC: No chief complaint on file.   Patient is a 58 y.o. female who presents for 69-month follow-up diabetes and hypertension. A/P as of last visit: #1 recent diagnosis diabetes. Tolerating metformin  XR 500 mg twice daily. Hemoglobin A1c significantly improved today 5.4%, down from 6.6% about 6 months ago. Continue metformin  XR 500 mg twice daily.   2.  Hypertension, well-controlled on lisinopril  20 mg a day. Electrolytes and creatinine monitoring today. 3.  Nocturnal leg cramps. She does well with as needed occasional use of cyclobenzaprine  10 mg.  #30, refill x 2 today.  INTERIM HX: ***  Past Medical History:  Diagnosis Date   Acute urinary retention 2018   Once after hysterectomy surgery 2012, another spontaneous occurrence 11/2017--Dr. Watt started her on flomax  12/2017--much improved as of 12/22/2017 and 03/2018 urol f/u.  Recurrence after spine surgery 02/2023   ALLERGIC RHINITIS 03/31/2010   Qualifier: Diagnosis of  By: Desiderio MD, [place]     Asthma    Cervical spondylosis 2011   w/out myelopathy; nerve block injections done 08/2010 and 10/2010 (Dr. Carilyn)   Colon cancer screening 11/20/2019   2020 and 2024 cologuard neg. Rpt 01/2026   Diabetes mellitus without complication (HCC)    Essential hypertension    Fibroids, submucosal    u/s 07/2010   GAD (generalized anxiety disorder)    with panic attacks   Hyperlipemia, mixed 2022   TLC   MDD (major depressive disorder)    Menorrhagia    Endo ablation 02/14/11   Migraine    plus tension HA's; some occipital HA's associated with her cervicalgia   Plantar fasciitis    Prediabetes    a1c 5.9% Dec 2022.  Jan 2024 a1c 6.4%   Shingles    x 2.  Once L rib cage, once L ear.   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 Mavis resected this->schwannoma on path    Past Surgical History:  Procedure Laterality Date   APPENDECTOMY  12/09/1977   CESAREAN  SECTION  02/07/2004   ENDOMETRIAL ABLATION  02/14/2011   ENDOMETRIAL BIOPSY  12/09/2010   Benign, with exogenous hormone effect.  No hyperplasia.   FINGER SURGERY  12/09/1980   Ring finger right hand; no metal/screws in finger.   LAMINECTOMY N/A 02/26/2023   Procedure: Thoracic twelve-Lumbar one Laminectomy For Resection of Intradural Extramedullary Tumor;  Surgeon: Mavis Purchase, MD;  Location: [facility] OR;  Service: Neurosurgery;  Laterality: N/A;   ROBOTIC ASSISTED LAPAROSCOPIC VAGINAL HYSTERECTOMY WITH FIBROID REMOVAL  02/17/2012   Benign leiomyomata and benign cervix on path    Outpatient Medications Prior to Visit  Medication Sig Dispense Refill   albuterol  (VENTOLIN  HFA) 108 (90 Base) MCG/ACT inhaler Inhale 1-2 puffs into the lungs every 4 (four) hours as needed for wheezing or shortness of breath. (Patient not taking: Reported on 06/21/2024) 6.7 g 0   Aspirin-Acetaminophen -Caffeine (EXCEDRIN PO) Take 2 tablets by mouth in the morning.     cetirizine (ZYRTEC) 10 MG tablet Take 10 mg by mouth daily.     Cholecalciferol (VITAMIN D3 PO) Take 1 tablet by mouth daily.     Cyanocobalamin (B-12 PO) Take 1 tablet by mouth daily.     cyclobenzaprine  (FLEXERIL ) 10 MG tablet Take 1 tablet (10 mg total) by mouth at bedtime as needed for muscle cramps. 30 tablet 2   fluticasone  (FLONASE ) 50 MCG/ACT nasal spray Place 2 sprays into both nostrils daily. (Patient not taking: Reported on 06/21/2024) 16  g 0   lisinopril  (ZESTRIL ) 20 MG tablet Take 1 tablet (20 mg total) by mouth daily. 90 tablet 3   Menthol , Topical Analgesic, (BIOFREEZE EX) Apply 1 Application topically daily as needed (pain).     metFORMIN  (GLUCOPHAGE -XR) 500 MG 24 hr tablet Take 1 tablet (500 mg total) by mouth 2 (two) times daily with a meal. 180 tablet 3   Multiple Vitamin (MULTIVITAMIN WITH MINERALS) TABS tablet Take 1 tablet by mouth daily.     tamsulosin  (FLOMAX ) 0.4 MG CAPS capsule Take 1 capsule (0.4 mg total) by mouth daily.  (Patient not taking: Reported on 06/21/2024) 30 capsule 1   No facility-administered medications prior to visit.    No Known Allergies  Review of Systems As per HPI  PE:    06/21/2024    9:02 AM 03/18/2024    1:57 PM 02/25/2024    1:32 PM  Vitals with BMI  Height 5' 6    Weight 155 lbs 13 oz -- 159 lbs 6 oz  BMI 25.16    Systolic 111  124  Diastolic 72  75  Pulse 66  67     Physical Exam  ***  LABS:  Last CBC Lab Results  Component Value Date   WBC 5.3 12/24/2023   HGB 14.8 12/24/2023   HCT 44.5 12/24/2023   MCV 95.1 12/24/2023   MCH 30.8 02/28/2023   RDW 13.3 12/24/2023   PLT 264.0 12/24/2023   Last metabolic panel Lab Results  Component Value Date   GLUCOSE 110 (H) 06/21/2024   NA 139 06/21/2024   K 3.9 06/21/2024   CL 104 06/21/2024   CO2 27 06/21/2024   BUN 15 06/21/2024   CREATININE 0.80 06/21/2024   GFR 82.12 06/21/2024   CALCIUM 9.1 06/21/2024   PROT 6.6 12/24/2023   ALBUMIN 4.6 12/24/2023   LABGLOB 2.1 06/03/2018   AGRATIO 2.1 06/03/2018   BILITOT 0.9 12/24/2023   ALKPHOS 75 12/24/2023   AST 18 12/24/2023   ALT 18 12/24/2023   ANIONGAP 8 02/28/2023   Last lipids Lab Results  Component Value Date   CHOL 196 12/24/2023   HDL 63.00 12/24/2023   LDLCALC 108 (H) 12/24/2023   LDLDIRECT 119.0 11/16/2021   TRIG 125.0 12/24/2023   CHOLHDL 3 12/24/2023   Last hemoglobin A1c Lab Results  Component Value Date   HGBA1C 5.4 06/21/2024   HGBA1C 5.4 06/21/2024   HGBA1C 5.4 (A) 06/21/2024   HGBA1C 5.4 06/21/2024   Last thyroid  functions Lab Results  Component Value Date   TSH 1.53 12/24/2023   IMPRESSION AND PLAN:  No problem-specific Assessment & Plan notes found for this encounter.   An After Visit Summary was printed and given to the patient.  FOLLOW UP: No follow-ups on file. Next CPE January 2026 Signed:  Gerlene Hockey, MD           09/27/2024

## 2024-09-29 ENCOUNTER — Encounter: Payer: Self-pay | Admitting: Family Medicine

## 2024-10-01 ENCOUNTER — Ambulatory Visit: Admitting: Family Medicine

## 2024-10-01 ENCOUNTER — Other Ambulatory Visit

## 2024-10-01 VITALS — BP 129/78 | HR 64 | Temp 97.5°F | Ht 66.0 in | Wt 151.8 lb

## 2024-10-01 DIAGNOSIS — Z7984 Long term (current) use of oral hypoglycemic drugs: Secondary | ICD-10-CM | POA: Diagnosis not present

## 2024-10-01 DIAGNOSIS — I10 Essential (primary) hypertension: Secondary | ICD-10-CM | POA: Diagnosis not present

## 2024-10-01 DIAGNOSIS — E119 Type 2 diabetes mellitus without complications: Secondary | ICD-10-CM

## 2024-10-01 NOTE — Progress Notes (Signed)
 OFFICE VISIT  10/01/2024  CC:  Chief Complaint  Patient presents with  . Medical Management of Chronic Issues    Patient is a 58 y.o. female who presents for 78-month follow-up diabetes and hypertension. A/P as of last visit: #1 recent diagnosis diabetes. Tolerating metformin  XR 500 mg twice daily. Hemoglobin A1c significantly improved today 5.4%, down from 6.6% about 6 months ago. Continue metformin  XR 500 mg twice daily.   2.  Hypertension, well-controlled on lisinopril  20 mg a day. Electrolytes and creatinine monitoring today.  3.  Nocturnal leg cramps. She does well with as needed occasional use of cyclobenzaprine  10 mg.  #30, refill x 2 today.  INTERIM HX: Sara Burton feels well. She does still have lower leg cramps, usually the left leg. Seems to happen more on the days she is at work as opposed to days she does a lot of walking. Cyclobenzaprine  10 mg helps but only last a few hours.  She takes magnesium and potassium as well.  She is eating a healthy diet.  Past Medical History:  Diagnosis Date  . Acute urinary retention 2018   Once after hysterectomy surgery 2012, another spontaneous occurrence 11/2017--Dr. Watt started her on flomax  12/2017--much improved as of 12/22/2017 and 03/2018 urol f/u.  Recurrence after spine surgery 02/2023  . ALLERGIC RHINITIS 03/31/2010   Qualifier: Diagnosis of  By: Desiderio MD, [place]    . Asthma   . Cervical spondylosis 2011   w/out myelopathy; nerve block injections done 08/2010 and 10/2010 (Dr. Carilyn)  . Colon cancer screening 11/20/2019   2020 and 2024 cologuard neg. Rpt 01/2026  . Diabetes mellitus without complication (HCC)   . Essential hypertension   . Fibroids, submucosal    u/s 07/2010  . GAD (generalized anxiety disorder)    with panic attacks  . Hyperlipemia, mixed 2022   TLC  . MDD (major depressive disorder)   . Menorrhagia    Endo ablation 02/14/11  . Migraine    plus tension HA's; some occipital HA's associated with her  cervicalgia  . Plantar fasciitis   . Prediabetes    a1c 5.9% Dec 2022.  Jan 2024 a1c 6.4%  . Shingles    x 2.  Once L rib cage, once L ear.  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 Mavis resected this->schwannoma on path    Past Surgical History:  Procedure Laterality Date  . APPENDECTOMY  12/09/1977  . CESAREAN SECTION  02/07/2004  . ENDOMETRIAL ABLATION  02/14/2011  . ENDOMETRIAL BIOPSY  12/09/2010   Benign, with exogenous hormone effect.  No hyperplasia.  [name] FINGER SURGERY  12/09/1980   Ring finger right hand; no metal/screws in finger.  [name] LAMINECTOMY N/A 02/26/2023   Procedure: Thoracic twelve-Lumbar one Laminectomy For Resection of Intradural Extramedullary Tumor;  Surgeon: Mavis Purchase, MD;  Location: [facility] OR;  Service: Neurosurgery;  Laterality: N/A;  . ROBOTIC ASSISTED LAPAROSCOPIC VAGINAL HYSTERECTOMY WITH FIBROID REMOVAL  02/17/2012   Benign leiomyomata and benign cervix on path    Outpatient Medications Prior to Visit  Medication Sig Dispense Refill  . albuterol  (VENTOLIN  HFA) 108 (90 Base) MCG/ACT inhaler Inhale 1-2 puffs into the lungs every 4 (four) hours as needed for wheezing or shortness of breath. 6.7 g 0  . Aspirin-Acetaminophen -Caffeine (EXCEDRIN PO) Take 2 tablets by mouth in the morning.    . cetirizine (ZYRTEC) 10 MG tablet Take 10 mg by mouth daily.    . Cholecalciferol (VITAMIN D3 PO) Take 1 tablet by  mouth daily.    . Cyanocobalamin (B-12 PO) Take 1 tablet by mouth daily.    . cyclobenzaprine  (FLEXERIL ) 10 MG tablet Take 1 tablet (10 mg total) by mouth at bedtime as needed for muscle cramps. 30 tablet 2  . lisinopril  (ZESTRIL ) 20 MG tablet Take 1 tablet (20 mg total) by mouth daily. 90 tablet 3  . Menthol , Topical Analgesic, (BIOFREEZE EX) Apply 1 Application topically daily as needed (pain).    . metFORMIN  (GLUCOPHAGE -XR) 500 MG 24 hr tablet Take 1 tablet (500 mg total) by mouth 2 (two) times daily with  a meal. 180 tablet 3  . Multiple Vitamin (MULTIVITAMIN WITH MINERALS) TABS tablet Take 1 tablet by mouth daily.    . fluticasone  (FLONASE ) 50 MCG/ACT nasal spray Place 2 sprays into both nostrils daily. (Patient not taking: Reported on 10/01/2024) 16 g 0  . tamsulosin  (FLOMAX ) 0.4 MG CAPS capsule Take 1 capsule (0.4 mg total) by mouth daily. (Patient not taking: Reported on 10/01/2024) 30 capsule 1   No facility-administered medications prior to visit.    No Known Allergies  Review of Systems As per HPI  PE:    10/01/2024    2:45 PM 06/21/2024    9:02 AM 03/18/2024    1:57 PM  Vitals with BMI  Height 5' 6 5' 6   Weight 151 lbs 13 oz 155 lbs 13 oz --  BMI 24.51 25.16   Systolic 129 111   Diastolic 78 72   Pulse 64 66      Physical Exam  General: Alert and well-appearing. Affect is pleasant, thought and speech are lucid. No further exam today.  LABS:  Last CBC Lab Results  Component Value Date   WBC 5.3 12/24/2023   HGB 14.8 12/24/2023   HCT 44.5 12/24/2023   MCV 95.1 12/24/2023   MCH 30.8 02/28/2023   RDW 13.3 12/24/2023   PLT 264.0 12/24/2023   Last metabolic panel Lab Results  Component Value Date   GLUCOSE 110 (H) 06/21/2024   NA 139 06/21/2024   K 3.9 06/21/2024   CL 104 06/21/2024   CO2 27 06/21/2024   BUN 15 06/21/2024   CREATININE 0.80 06/21/2024   GFR 82.12 06/21/2024   CALCIUM 9.1 06/21/2024   PROT 6.6 12/24/2023   ALBUMIN 4.6 12/24/2023   LABGLOB 2.1 06/03/2018   AGRATIO 2.1 06/03/2018   BILITOT 0.9 12/24/2023   ALKPHOS 75 12/24/2023   AST 18 12/24/2023   ALT 18 12/24/2023   ANIONGAP 8 02/28/2023   Last lipids Lab Results  Component Value Date   CHOL 196 12/24/2023   HDL 63.00 12/24/2023   LDLCALC 108 (H) 12/24/2023   LDLDIRECT 119.0 11/16/2021   TRIG 125.0 12/24/2023   CHOLHDL 3 12/24/2023   Last hemoglobin A1c Lab Results  Component Value Date   HGBA1C 5.4 06/21/2024   HGBA1C 5.4 06/21/2024   HGBA1C 5.4 (A) 06/21/2024    HGBA1C 5.4 06/21/2024   Last thyroid  functions Lab Results  Component Value Date   TSH 1.53 12/24/2023   IMPRESSION AND PLAN:  #1 diabetes without complication.  Well-controlled. Point-of-care hemoglobin A1c today is 5.7%. Continue metformin  XR 500 mg twice daily.  #2 hypertension, well-controlled on lisinopril  20 mg a day.  3.  Nocturnal leg cramps.  She will try adding tonic water to her regimen.  An After Visit Summary was printed and given to the patient.  FOLLOW UP: Return in about 3 months (around 01/01/2025) for annual CPE (fasting). Next CPE January  2026 Signed:  Gerlene Hockey, MD           10/01/2024

## 2024-10-04 LAB — POCT GLYCOSYLATED HEMOGLOBIN (HGB A1C)
HbA1c POC (<> result, manual entry): 5.7 % (ref 4.0–5.6)
HbA1c, POC (controlled diabetic range): 5.7 % (ref 0.0–7.0)
HbA1c, POC (prediabetic range): 5.7 % (ref 5.7–6.4)
Hemoglobin A1C: 5.7 % — AB (ref 4.0–5.6)

## 2024-10-11 ENCOUNTER — Telehealth: Admitting: Physician Assistant

## 2024-10-11 DIAGNOSIS — J019 Acute sinusitis, unspecified: Secondary | ICD-10-CM | POA: Diagnosis not present

## 2024-10-11 DIAGNOSIS — B9789 Other viral agents as the cause of diseases classified elsewhere: Secondary | ICD-10-CM | POA: Diagnosis not present

## 2024-10-11 NOTE — Progress Notes (Signed)
 We are sorry that you are not feeling well.  Here is how we plan to help!  Based on what you have shared with me it looks like you have sinusitis.  Sinusitis is inflammation and infection in the sinus cavities of the head.  Based on your presentation I believe you most likely have Acute Viral Sinusitis.This is an infection most likely caused by a virus. There is not specific treatment for viral sinusitis other than to help you with the symptoms until the infection runs its course.  You may use an oral decongestant such as Mucinex D or if you have glaucoma or high blood pressure use plain Mucinex. Saline nasal spray help and can safely be used as often as needed for congestion, I have prescribed: Azelastine nasal spray 2 sprays in each nostril twice a day  Some authorities believe that zinc sprays or the use of Echinacea may shorten the course of your symptoms.  Sinus infections are not as easily transmitted as other respiratory infection, however we still recommend that you avoid close contact with loved ones, especially the very young and elderly.  Remember to wash your hands thoroughly throughout the day as this is the number one way to prevent the spread of infection!  Home Care: Only take medications as instructed by your medical team. Do not take these medications with alcohol. A steam or ultrasonic humidifier can help congestion.  You can place a towel over your head and breathe in the steam from hot water  coming from a faucet. Avoid close contacts especially the very young and the elderly. Cover your mouth when you cough or sneeze. Always remember to wash your hands.  Get Help Right Away If: You develop worsening fever or sinus pain. You develop a severe head ache or visual changes. Your symptoms persist after you have completed your treatment plan.  Make sure you Understand these instructions. Will watch your condition. Will get help right away if you are not doing well or get  worse.  Your e-visit answers were reviewed by a board certified advanced clinical practitioner to complete your personal care plan.  Depending on the condition, your plan could have included both over the counter or prescription medications.  If there is a problem please reply  once you have received a response from your provider.  Your safety is important to us .  If you have drug allergies check your prescription carefully.    You can use MyChart to ask questions about today's visit, request a non-urgent call back, or ask for a work or school excuse for 24 hours related to this e-Visit. If it has been greater than 24 hours you will need to follow up with your provider, or enter a new e-Visit to address those concerns.  You will get an e-mail in the next two days asking about your experience.  I hope that your e-visit has been valuable and will speed your recovery. Thank you for using e-visits.  I have spent 5 minutes in review of e-visit questionnaire, review and updating patient chart, medical decision making and response to patient.   Teena Shuck, PA-C

## 2024-10-14 ENCOUNTER — Encounter: Payer: Self-pay | Admitting: Sports Medicine

## 2024-10-14 ENCOUNTER — Ambulatory Visit: Admitting: Sports Medicine

## 2024-10-14 VITALS — BP 138/78 | HR 75 | Temp 99.1°F | Wt 152.8 lb

## 2024-10-14 DIAGNOSIS — R051 Acute cough: Secondary | ICD-10-CM

## 2024-10-14 DIAGNOSIS — J0100 Acute maxillary sinusitis, unspecified: Secondary | ICD-10-CM

## 2024-10-14 DIAGNOSIS — I10 Essential (primary) hypertension: Secondary | ICD-10-CM

## 2024-10-14 LAB — POC COVID19 BINAXNOW: SARS Coronavirus 2 Ag: NEGATIVE

## 2024-10-14 LAB — POCT INFLUENZA A/B
Influenza A, POC: NEGATIVE
Influenza B, POC: NEGATIVE

## 2024-10-14 LAB — POCT RAPID STREP A (OFFICE): Rapid Strep A Screen: NEGATIVE

## 2024-10-14 MED ORDER — BENZONATATE 200 MG PO CAPS: 200.0000 mg | ORAL_CAPSULE | Freq: Two times a day (BID) | ORAL | 0 refills | Status: AC | PRN

## 2024-10-14 MED ORDER — AMOXICILLIN-POT CLAVULANATE 500-125 MG PO TABS: 1.0000 | ORAL_TABLET | Freq: Two times a day (BID) | ORAL | 0 refills | Status: DC

## 2024-10-14 NOTE — Progress Notes (Signed)
 Careteam: Patient Care Team: Candise Aleene DEL, MD as PCP - General (Family Medicine) Rosalva Sawyer, MD as Consulting Physician (Obstetrics and Gynecology) Watt Rush, MD as Consulting Physician (Urology) Oman, Heather, OD (Optometry) Mavis Purchase, MD as Consulting Physician (Neurosurgery)  PLACE OF SERVICE:  [facility]  Advanced Directive information    No Known Allergies  No chief complaint on file.    Discussed the use of AI scribe software for clinical note transcription with the patient, who gave verbal consent to proceed.  History of Present Illness  Sara Burton is a 58 year old female with asthma who presents with symptoms suggestive of a sinus infection.  She has been experiencing symptoms consistent with a sinus infection since Saturday, including facial pain, dental pain, eye pain, and headache. The facial pain is described as feeling like 'somebody's hitting me in my face.' Nasal congestion and pressure are present, which she attempts to alleviate by massaging her face. She reports that when she gets sick, it often starts with allergies and then she develops sinus infections and bronchitis.  She reports a persistent dry cough that worsens when lying down, accompanied by a sensation of post-nasal drip. No productive cough is present, but there is nausea when coughing.  Denies significant changes in breathing or wheezing. She has used her albuterol  inhaler due to chest tightness, which she associates with her asthma history.  Ear pain and a sensation of fullness in both ears are reported, though no ear drainage is present. She feels fatigued and lacks energy, with a decreased appetite, though she tries to eat due to her diabetes. No fever is reported, as she has not been able to check her temperature. Nyquil and Mucinex have been taken for symptom relief.  c/o bodyaches. No diarrhea is present. She has a history of asthma but denies any history of smoking. No  changes in urination frequency or pain with urination are reported.  She has been using nasal spray and has considered using a neti pot for symptom relief. No allergies to antibiotics are reported.    Review of Systems:  Review of Systems  Constitutional:  Positive for chills and malaise/fatigue. Negative for fever.  HENT:  Positive for congestion, ear pain and sinus pain. Negative for ear discharge and sore throat.   Respiratory:  Positive for cough. Negative for sputum production and shortness of breath.   Cardiovascular:  Negative for chest pain, palpitations and leg swelling.  Gastrointestinal:  Negative for abdominal pain, diarrhea, heartburn and nausea.  Genitourinary:  Negative for dysuria, frequency and hematuria.  Musculoskeletal:  Positive for myalgias. Negative for falls.  Neurological:  Negative for dizziness.   Negative unless indicated in HPI.   Past Medical History:  Diagnosis Date   Acute urinary retention 2018   Once after hysterectomy surgery 2012, another spontaneous occurrence 11/2017--Dr. Watt started her on flomax  12/2017--much improved as of 12/22/2017 and 03/2018 urol f/u.  Recurrence after spine surgery 02/2023   ALLERGIC RHINITIS 03/31/2010   Qualifier: Diagnosis of  By: Desiderio MD, Sherwood     Arthritis    Asthma    Cervical spondylosis 2011   w/out myelopathy; nerve block injections done 08/2010 and 10/2010 (Dr. Carilyn)   Colon cancer screening 11/20/2019   2020 and 2024 cologuard neg. Rpt 01/2026   Diabetes mellitus without complication [facility])    Essential hypertension    Fibroids, submucosal    u/s 07/2010   GAD (generalized anxiety disorder)    with panic  attacks   Heart murmur    Hyperlipemia, mixed 2022   TLC   MDD (major depressive disorder)    Menorrhagia    Endo ablation 02/14/11   Migraine    plus tension HA's; some occipital HA's associated with her cervicalgia   Plantar fasciitis    Prediabetes    a1c 5.9% Dec 2022.  Jan 2024 a1c 6.4%    Shingles    x 2.  Once L rib cage, once L ear.   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 Mavis resected this->schwannoma on path   Past Surgical History:  Procedure Laterality Date   ABDOMINAL HYSTERECTOMY     APPENDECTOMY  12/09/1977   CESAREAN SECTION  02/07/2004   ENDOMETRIAL ABLATION  02/14/2011   ENDOMETRIAL BIOPSY  12/09/2010   Benign, with exogenous hormone effect.  No hyperplasia.   FINGER SURGERY  12/09/1980   Ring finger right hand; no metal/screws in finger.   LAMINECTOMY N/A 02/26/2023   Procedure: Thoracic twelve-Lumbar one Laminectomy For Resection of Intradural Extramedullary Tumor;  Surgeon: Mavis Purchase, MD;  Location: [facility] OR;  Service: Neurosurgery;  Laterality: N/A;   ROBOTIC ASSISTED LAPAROSCOPIC VAGINAL HYSTERECTOMY WITH FIBROID REMOVAL  02/17/2012   Benign leiomyomata and benign cervix on path   Social History:   reports that she has never smoked. She has never used smokeless tobacco. She reports that she does not currently use alcohol. She reports that she does not use drugs.  Family History  Problem Relation Age of Onset   Heart disease Father        CABG in his 32s   Diabetes Father    Asthma Brother    Cancer Paternal Grandmother        Breast; detected in her 64's at the earliest   Breast cancer Paternal Grandmother     Medications: Patient's Medications  New Prescriptions   No medications on file  Previous Medications   ALBUTEROL  (VENTOLIN  HFA) 108 (90 BASE) MCG/ACT INHALER    Inhale 1-2 puffs into the lungs every 4 (four) hours as needed for wheezing or shortness of breath.   ASPIRIN-ACETAMINOPHEN -CAFFEINE (EXCEDRIN PO)    Take 2 tablets by mouth in the morning.   CETIRIZINE (ZYRTEC) 10 MG TABLET    Take 10 mg by mouth daily.   CHOLECALCIFEROL (VITAMIN D3 PO)    Take 1 tablet by mouth daily.   CYANOCOBALAMIN (B-12 PO)    Take 1 tablet by mouth daily.   CYCLOBENZAPRINE  (FLEXERIL ) 10 MG TABLET     Take 1 tablet (10 mg total) by mouth at bedtime as needed for muscle cramps.   FLUTICASONE  (FLONASE ) 50 MCG/ACT NASAL SPRAY    Place 2 sprays into both nostrils daily.   LISINOPRIL  (ZESTRIL ) 20 MG TABLET    Take 1 tablet (20 mg total) by mouth daily.   MENTHOL , TOPICAL ANALGESIC, (BIOFREEZE EX)    Apply 1 Application topically daily as needed (pain).   METFORMIN  (GLUCOPHAGE -XR) 500 MG 24 HR TABLET    Take 1 tablet (500 mg total) by mouth 2 (two) times daily with a meal.   MULTIPLE VITAMIN (MULTIVITAMIN WITH MINERALS) TABS TABLET    Take 1 tablet by mouth daily.   TAMSULOSIN  (FLOMAX ) 0.4 MG CAPS CAPSULE    Take 1 capsule (0.4 mg total) by mouth daily.  Modified Medications   No medications on file  Discontinued Medications   No medications on file    Physical Exam: There were no vitals filed for  this visit. There is no height or weight on file to calculate BMI. BP Readings from Last 3 Encounters:  10/01/24 129/78  06/21/24 111/72  02/25/24 124/75   Wt Readings from Last 3 Encounters:  10/01/24 151 lb 12.8 oz (68.9 kg)  06/21/24 155 lb 12.8 oz (70.7 kg)  02/25/24 159 lb 6.4 oz (72.3 kg)    Physical Exam Constitutional:      Appearance: Normal appearance.  HENT:     Head: Normocephalic and atraumatic.     Right Ear: Tympanic membrane normal.     Left Ear: Tympanic membrane normal.     Mouth/Throat:     Comments: Bil maxillary sinus tenderness  Cardiovascular:     Rate and Rhythm: Normal rate and regular rhythm.  Pulmonary:     Effort: Pulmonary effort is normal. No respiratory distress.     Breath sounds: Normal breath sounds. No wheezing.  Abdominal:     General: Bowel sounds are normal. There is no distension.     Tenderness: There is no abdominal tenderness. There is no guarding or rebound.     Comments:    Musculoskeletal:        General: No swelling or tenderness.  Neurological:     Mental Status: She is alert. Mental status is at baseline.     Sensory: No  sensory deficit.     Motor: No weakness.     Labs reviewed: Basic Metabolic Panel: Recent Labs    12/24/23 0826 06/21/24 0919  NA 142 139  K 5.1 3.9  CL 106 104  CO2 29 27  GLUCOSE 127* 110*  BUN 22 15  CREATININE 0.87 0.80  CALCIUM 9.5 9.1  TSH 1.53  --    Liver Function Tests: Recent Labs    12/24/23 0826  AST 18  ALT 18  ALKPHOS 75  BILITOT 0.9  PROT 6.6  ALBUMIN 4.6   No results for input(s): LIPASE, AMYLASE in the last 8760 hours. No results for input(s): AMMONIA in the last 8760 hours. CBC: Recent Labs    12/24/23 0826  WBC 5.3  HGB 14.8  HCT 44.5  MCV 95.1  PLT 264.0   Lipid Panel: Recent Labs    12/24/23 0826  CHOL 196  HDL 63.00  LDLCALC 108*  TRIG 125.0  CHOLHDL 3   TSH: Recent Labs    12/24/23 0826  TSH 1.53   A1C: Lab Results  Component Value Date   HGBA1C 5.7 (A) 10/04/2024   HGBA1C 5.7 10/04/2024   HGBA1C 5.7 10/04/2024   HGBA1C 5.7 10/04/2024    Assessment and Plan Assessment & Plan   1. Acute non-recurrent maxillary sinusitis (Primary) Bil maxillary sinus tenderness Temp 99.1 Will send augmentin  to her pharmacy Instructed to use flonase , claritin   2. Acute cough Lungs clear  O2 sat 96% on RA  Take tessalon   Salt water gurgling   3. Primary hypertension Repeat bp Cont with lisinopril     Other orders - amoxicillin -clavulanate (AUGMENTIN ) 500-125 MG tablet; Take 1 tablet by mouth in the morning and at bedtime.  Dispense: 10 tablet; Refill: 0 - benzonatate  (TESSALON ) 200 MG capsule; Take 1 capsule (200 mg total) by mouth 2 (two) times daily as needed for cough.  Dispense: 30 capsule; Refill: 0     No follow-ups on file.: follow up with PCP  Arynn Armand

## 2024-10-15 ENCOUNTER — Ambulatory Visit: Admitting: Family Medicine

## 2024-12-13 ENCOUNTER — Other Ambulatory Visit (HOSPITAL_COMMUNITY): Payer: Self-pay

## 2024-12-20 ENCOUNTER — Encounter: Payer: Self-pay | Admitting: Family Medicine

## 2024-12-20 ENCOUNTER — Telehealth: Admitting: Family Medicine

## 2024-12-20 DIAGNOSIS — B349 Viral infection, unspecified: Secondary | ICD-10-CM | POA: Diagnosis not present

## 2024-12-20 DIAGNOSIS — J101 Influenza due to other identified influenza virus with other respiratory manifestations: Secondary | ICD-10-CM

## 2024-12-20 MED ORDER — HYDROCODONE BIT-HOMATROP MBR 5-1.5 MG/5ML PO SOLN: ORAL | 0 refills | Status: DC

## 2024-12-20 MED ORDER — OSELTAMIVIR PHOSPHATE 75 MG PO CAPS: 75.0000 mg | ORAL_CAPSULE | Freq: Two times a day (BID) | ORAL | 0 refills | Status: AC

## 2024-12-20 NOTE — Progress Notes (Signed)
 Virtual Visit via Video Note  I connected with Sara Burton  on 12/20/2024 at  1:20 PM EST by a video enabled telemedicine application and verified that I am speaking with the correct person using two identifiers.  Location patient: [place] Location provider:work or home office Persons participating in the virtual visit: patient, provider  I discussed the limitations and requested verbal permission for telemedicine visit. The patient expressed understanding and agreed to proceed.   HPI: Onset about 24 hours ago--> runny nose, headache, sore throat, significant fatigue and body aches and subjective fever/chills. A little bit of nausea but no vomiting or diarrhea.  She is able to eat and drink. She has taken some over-the-counter cough/cold medication and it has not helped.  Her son tested positive for flu a couple days ago and has been staying with her in her house. She got flu vaccine October 2025.  ROS: See pertinent positives and negatives per HPI.  Past Medical History:  Diagnosis Date   Acute urinary retention 2018   Once after hysterectomy surgery 2012, another spontaneous occurrence 11/2017--Dr. Watt started her on flomax  12/2017--much improved as of 12/22/2017 and 03/2018 urol f/u.  Recurrence after spine surgery 02/2023   ALLERGIC RHINITIS 03/31/2010   Qualifier: Diagnosis of  By: Desiderio MD, Sherwood     Arthritis    Asthma    Cervical spondylosis 2011   w/out myelopathy; nerve block injections done 08/2010 and 10/2010 (Dr. Carilyn)   Colon cancer screening 11/20/2019   2020 and 2024 cologuard neg. Rpt 01/2026   Diabetes mellitus without complication [facility])    Essential hypertension    Fibroids, submucosal    u/s 07/2010   GAD (generalized anxiety disorder)    with panic attacks   Heart murmur    Hyperlipemia, mixed 2022   TLC   MDD (major depressive disorder)    Menorrhagia    Endo ablation 02/14/11   Migraine    plus tension HA's; some occipital HA's associated with her cervicalgia    Plantar fasciitis    Prediabetes    a1c 5.9% Dec 2022.  Jan 2024 a1c 6.4%   Shingles    x 2.  Once L rib cage, once L ear.   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 Mavis resected this->schwannoma on path    Past Surgical History:  Procedure Laterality Date   ABDOMINAL HYSTERECTOMY     APPENDECTOMY  12/09/1977   CESAREAN SECTION  02/07/2004   ENDOMETRIAL ABLATION  02/14/2011   ENDOMETRIAL BIOPSY  12/09/2010   Benign, with exogenous hormone effect.  No hyperplasia.   FINGER SURGERY  12/09/1980   Ring finger right hand; no metal/screws in finger.   LAMINECTOMY N/A 02/26/2023   Procedure: Thoracic twelve-Lumbar one Laminectomy For Resection of Intradural Extramedullary Tumor;  Surgeon: Mavis Purchase, MD;  Location: [facility] OR;  Service: Neurosurgery;  Laterality: N/A;   ROBOTIC ASSISTED LAPAROSCOPIC VAGINAL HYSTERECTOMY WITH FIBROID REMOVAL  02/17/2012   Benign leiomyomata and benign cervix on path    Current Medications[1]  EXAM:  VITALS per patient if applicable:     10/14/2024   11:47 AM 10/14/2024   11:17 AM 10/01/2024    2:45 PM  Vitals with BMI  Height   5' 6  Weight  152 lbs 13 oz 151 lbs 13 oz  BMI  24.67 24.51  Systolic 138 149 870  Diastolic 78 78 78  Pulse  75 64     GENERAL: alert, oriented, appears well and in no  acute distress  HEENT: atraumatic, conjunttiva clear, no obvious abnormalities on inspection of external nose and ears  NECK: normal movements of the head and neck  LUNGS: on inspection no signs of respiratory distress, breathing rate appears normal, no obvious gross SOB, gasping or wheezing  CV: no obvious cyanosis  MS: moves all visible extremities without noticeable abnormality  PSYCH/NEURO: pleasant and cooperative, no obvious depression or anxiety, speech and thought processing grossly intact  LABS: none today    Chemistry      Component Value Date/Time   NA 139 06/21/2024 0919    NA 141 06/03/2018 0825   K 3.9 06/21/2024 0919   CL 104 06/21/2024 0919   CO2 27 06/21/2024 0919   BUN 15 06/21/2024 0919   BUN 16 06/03/2018 0825   CREATININE 0.80 06/21/2024 0919   CREATININE 0.86 11/30/2020 0855      Component Value Date/Time   CALCIUM 9.1 06/21/2024 0919   ALKPHOS 75 12/24/2023 0826   AST 18 12/24/2023 0826   ALT 18 12/24/2023 0826   BILITOT 0.9 12/24/2023 0826   BILITOT 0.9 06/03/2018 0825     ASSESSMENT AND PLAN:  Discussed the following assessment and plan:  Influenza-like illness, exposure to influenza A and B. She is within the 48-hour treatment window. Tamiflu  75 mg twice daily x 5 days prescribed today. Hycodan suspension, 1 to 2 teaspoon twice daily as needed, 120 mL.   I discussed the assessment and treatment plan with the patient. The patient was provided an opportunity to ask questions and all were answered. The patient agreed with the plan and demonstrated an understanding of the instructions.   F/u: 3 to 4 days if not significantly improving Has a routine follow-up visit set 01/05/2025 Signed:  Gerlene Hockey, MD           12/20/2024     [1]  Current Outpatient Medications:    albuterol  (VENTOLIN  HFA) 108 (90 Base) MCG/ACT inhaler, Inhale 1-2 puffs into the lungs every 4 (four) hours as needed for wheezing or shortness of breath., Disp: 6.7 g, Rfl: 0   Aspirin-Acetaminophen -Caffeine (EXCEDRIN PO), Take 2 tablets by mouth in the morning., Disp: , Rfl:    cetirizine (ZYRTEC) 10 MG tablet, Take 10 mg by mouth daily., Disp: , Rfl:    Cholecalciferol (VITAMIN D3 PO), Take 1 tablet by mouth daily., Disp: , Rfl:    Cyanocobalamin (B-12 PO), Take 1 tablet by mouth daily., Disp: , Rfl:    cyclobenzaprine  (FLEXERIL ) 10 MG tablet, Take 1 tablet (10 mg total) by mouth at bedtime as needed for muscle cramps., Disp: 30 tablet, Rfl: 2   fluticasone  (FLONASE ) 50 MCG/ACT nasal spray, Place 2 sprays into both nostrils daily., Disp: 16 g, Rfl: 0   HYDROcodone   bit-homatropine (HYCODAN) 5-1.5 MG/5ML syrup, 1 to 2 teaspoons p.o. twice daily as needed cough, Disp: 120 mL, Rfl: 0   lisinopril  (ZESTRIL ) 20 MG tablet, Take 1 tablet (20 mg total) by mouth daily., Disp: 90 tablet, Rfl: 3   Menthol , Topical Analgesic, (BIOFREEZE EX), Apply 1 Application topically daily as needed (pain)., Disp: , Rfl:    metFORMIN  (GLUCOPHAGE -XR) 500 MG 24 hr tablet, Take 1 tablet (500 mg total) by mouth 2 (two) times daily with a meal., Disp: 180 tablet, Rfl: 3   Multiple Vitamin (MULTIVITAMIN WITH MINERALS) TABS tablet, Take 1 tablet by mouth daily., Disp: , Rfl:    oseltamivir  (TAMIFLU ) 75 MG capsule, Take 1 capsule (75 mg total) by mouth 2 (two) times  daily for 5 days., Disp: 10 capsule, Rfl: 0   amoxicillin -clavulanate (AUGMENTIN ) 500-125 MG tablet, Take 1 tablet by mouth in the morning and at bedtime. (Patient not taking: Reported on 12/20/2024), Disp: 10 tablet, Rfl: 0   benzonatate  (TESSALON ) 200 MG capsule, Take 1 capsule (200 mg total) by mouth 2 (two) times daily as needed for cough. (Patient not taking: Reported on 12/20/2024), Disp: 30 capsule, Rfl: 0

## 2024-12-28 ENCOUNTER — Other Ambulatory Visit: Payer: Self-pay | Admitting: Family Medicine

## 2024-12-28 DIAGNOSIS — Z1231 Encounter for screening mammogram for malignant neoplasm of breast: Secondary | ICD-10-CM

## 2024-12-30 ENCOUNTER — Encounter: Payer: Self-pay | Admitting: Family Medicine

## 2024-12-30 ENCOUNTER — Telehealth: Admitting: Family Medicine

## 2024-12-30 DIAGNOSIS — J0100 Acute maxillary sinusitis, unspecified: Secondary | ICD-10-CM | POA: Diagnosis not present

## 2024-12-30 DIAGNOSIS — J209 Acute bronchitis, unspecified: Secondary | ICD-10-CM | POA: Diagnosis not present

## 2024-12-30 MED ORDER — PREDNISONE 20 MG PO TABS: ORAL_TABLET | ORAL | 0 refills | Status: DC

## 2024-12-30 MED ORDER — AMOXICILLIN-POT CLAVULANATE 500-125 MG PO TABS: 1.0000 | ORAL_TABLET | Freq: Two times a day (BID) | ORAL | 0 refills | Status: AC

## 2024-12-30 NOTE — Progress Notes (Signed)
 Virtual Visit via Video Note  I connected with Sara Burton  on 12/30/24 at  2:20 PM EST by a video enabled telemedicine application and verified that I am speaking with the correct person using two identifiers.  Location patient: [place] Location provider:work or home office Persons participating in the virtual visit: patient, provider  I discussed the limitations and requested verbal permission for telemedicine visit. The patient expressed understanding and agreed to proceed.  HPI: 59 year old female being seen today for flu symptoms. I saw her 12/20/2024 at the beginning of a flulike illness,+ exposure to flu, prescribed Tamiflu  and Hycodan. She has had mild lingering symptoms and then in the last 24 hours had significant nasal congestion/sinus pressure, postnasal drip, and cough with feeling of chest heaviness/tightness. Inhaler works for only a little while. Subjective fever last night.  No rash, no nausea or vomiting or diarrhea. No chest pain.  ROS: See pertinent positives and negatives per HPI.  Past Medical History:  Diagnosis Date   Acute urinary retention 2018   Once after hysterectomy surgery 2012, another spontaneous occurrence 11/2017--Dr. Watt started her on flomax  12/2017--much improved as of 12/22/2017 and 03/2018 urol f/u.  Recurrence after spine surgery 02/2023   ALLERGIC RHINITIS 03/31/2010   Qualifier: Diagnosis of  By: Desiderio MD, Sherwood     Arthritis    Asthma    Cervical spondylosis 2011   w/out myelopathy; nerve block injections done 08/2010 and 10/2010 (Dr. Carilyn)   Colon cancer screening 11/20/2019   2020 and 2024 cologuard neg. Rpt 01/2026   Diabetes mellitus without complication [facility])    Essential hypertension    Fibroids, submucosal    u/s 07/2010   GAD (generalized anxiety disorder)    with panic attacks   Heart murmur    Hyperlipemia, mixed 2022   TLC   MDD (major depressive disorder)    Menorrhagia    Endo ablation 02/14/11   Migraine    plus tension HA's;  some occipital HA's associated with her cervicalgia   Plantar fasciitis    Prediabetes    a1c 5.9% Dec 2022.  Jan 2024 a1c 6.4%   Shingles    x 2.  Once L rib cage, once L ear.   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 Mavis resected this->schwannoma on path    Past Surgical History:  Procedure Laterality Date   ABDOMINAL HYSTERECTOMY     APPENDECTOMY  12/09/1977   CESAREAN SECTION  02/07/2004   ENDOMETRIAL ABLATION  02/14/2011   ENDOMETRIAL BIOPSY  12/09/2010   Benign, with exogenous hormone effect.  No hyperplasia.   FINGER SURGERY  12/09/1980   Ring finger right hand; no metal/screws in finger.   LAMINECTOMY N/A 02/26/2023   Procedure: Thoracic twelve-Lumbar one Laminectomy For Resection of Intradural Extramedullary Tumor;  Surgeon: Mavis Purchase, MD;  Location[facility] OR;  Service: Neurosurgery;  Laterality: N/A;   ROBOTIC ASSISTED LAPAROSCOPIC VAGINAL HYSTERECTOMY WITH FIBROID REMOVAL  02/17/2012   Benign leiomyomata and benign cervix on path    Current Medications[1]  EXAM:  VITALS per patient if applicable:     10/14/2024   11:47 AM 10/14/2024   11:17 AM 10/01/2024    2:45 PM  Vitals with BMI  Height   5' 6  Weight  152 lbs 13 oz 151 lbs 13 oz  BMI  24.67 24.51  Systolic 138 149 870  Diastolic 78 78 78  Pulse  75 64     GENERAL: alert, oriented, appears well and in  no acute distress  HEENT: atraumatic, conjunttiva clear, no obvious abnormalities on inspection of external nose and ears  NECK: normal movements of the head and neck  LUNGS: on inspection no signs of respiratory distress, breathing rate appears normal, no obvious gross SOB, gasping or wheezing  CV: no obvious cyanosis  MS: moves all visible extremities without noticeable abnormality  PSYCH/NEURO: pleasant and cooperative, no obvious depression or anxiety, speech and thought processing grossly intact  LABS: none today    Chemistry       Component Value Date/Time   NA 139 06/21/2024 0919   NA 141 06/03/2018 0825   K 3.9 06/21/2024 0919   CL 104 06/21/2024 0919   CO2 27 06/21/2024 0919   BUN 15 06/21/2024 0919   BUN 16 06/03/2018 0825   CREATININE 0.80 06/21/2024 0919   CREATININE 0.86 11/30/2020 0855      Component Value Date/Time   CALCIUM 9.1 06/21/2024 0919   ALKPHOS 75 12/24/2023 0826   AST 18 12/24/2023 0826   ALT 18 12/24/2023 0826   BILITOT 0.9 12/24/2023 0826   BILITOT 0.9 06/03/2018 0825     ASSESSMENT AND PLAN:  Discussed the following assessment and plan:  Acute sinusitis and bronchitis. This is in the setting of recent/lingering influenza illness. Prednisone  40 mg a day x 5 days prescribed.  Also Augmentin  875 twice daily x 7 days. She can take Robitussin DM during the daytime and she has Hycodan suspension to take at nighttime (from prior prescription). Continue albuterol  every 4-6 hours as needed.  I discussed the assessment and treatment plan with the patient. The patient was provided an opportunity to ask questions and all were answered. The patient agreed with the plan and demonstrated an understanding of the instructions.   F/u: She has a CPE already set for next week  Signed:  Gerlene Hockey, MD           12/30/2024     [1]  Current Outpatient Medications:    albuterol  (VENTOLIN  HFA) 108 (90 Base) MCG/ACT inhaler, Inhale 1-2 puffs into the lungs every 4 (four) hours as needed for wheezing or shortness of breath., Disp: 6.7 g, Rfl: 0   Aspirin-Acetaminophen -Caffeine (EXCEDRIN PO), Take 2 tablets by mouth in the morning., Disp: , Rfl:    cetirizine (ZYRTEC) 10 MG tablet, Take 10 mg by mouth daily., Disp: , Rfl:    Cholecalciferol (VITAMIN D3 PO), Take 1 tablet by mouth daily., Disp: , Rfl:    Cyanocobalamin (B-12 PO), Take 1 tablet by mouth daily., Disp: , Rfl:    cyclobenzaprine  (FLEXERIL ) 10 MG tablet, Take 1 tablet (10 mg total) by mouth at bedtime as needed for muscle cramps., Disp: 30  tablet, Rfl: 2   fluticasone  (FLONASE ) 50 MCG/ACT nasal spray, Place 2 sprays into both nostrils daily., Disp: 16 g, Rfl: 0   HYDROcodone  bit-homatropine (HYCODAN) 5-1.5 MG/5ML syrup, 1 to 2 teaspoons p.o. twice daily as needed cough, Disp: 120 mL, Rfl: 0   lisinopril  (ZESTRIL ) 20 MG tablet, Take 1 tablet (20 mg total) by mouth daily., Disp: 90 tablet, Rfl: 3   Menthol , Topical Analgesic, (BIOFREEZE EX), Apply 1 Application topically daily as needed (pain)., Disp: , Rfl:    metFORMIN  (GLUCOPHAGE -XR) 500 MG 24 hr tablet, Take 1 tablet (500 mg total) by mouth 2 (two) times daily with a meal., Disp: 180 tablet, Rfl: 3   Multiple Vitamin (MULTIVITAMIN WITH MINERALS) TABS tablet, Take 1 tablet by mouth daily., Disp: , Rfl:  amoxicillin -clavulanate (AUGMENTIN ) 500-125 MG tablet, Take 1 tablet by mouth in the morning and at bedtime. (Patient not taking: Reported on 12/30/2024), Disp: 10 tablet, Rfl: 0   benzonatate  (TESSALON ) 200 MG capsule, Take 1 capsule (200 mg total) by mouth 2 (two) times daily as needed for cough. (Patient not taking: Reported on 12/30/2024), Disp: 30 capsule, Rfl: 0

## 2024-12-31 ENCOUNTER — Ambulatory Visit: Admitting: Family Medicine

## 2025-01-03 ENCOUNTER — Other Ambulatory Visit (HOSPITAL_COMMUNITY): Payer: Self-pay

## 2025-01-05 ENCOUNTER — Encounter: Admitting: Family Medicine

## 2025-01-19 ENCOUNTER — Encounter: Admitting: Family Medicine

## 2025-01-20 ENCOUNTER — Ambulatory Visit
